# Patient Record
Sex: Male | Born: 1970 | Race: Black or African American | Hispanic: No | Marital: Single | Smoking: Former smoker
Health system: Southern US, Community
[De-identification: ages and names within clinical notes are randomized; demographics above are authoritative.]

## PROBLEM LIST (undated history)

## (undated) DIAGNOSIS — F419 Anxiety disorder, unspecified: Secondary | ICD-10-CM

## (undated) DIAGNOSIS — Z86718 Personal history of other venous thrombosis and embolism: Secondary | ICD-10-CM

## (undated) DIAGNOSIS — F32A Depression, unspecified: Secondary | ICD-10-CM

## (undated) DIAGNOSIS — D689 Coagulation defect, unspecified: Secondary | ICD-10-CM

## (undated) DIAGNOSIS — I10 Essential (primary) hypertension: Secondary | ICD-10-CM

## (undated) HISTORY — DX: Coagulation defect, unspecified: D68.9

## (undated) HISTORY — DX: Essential (primary) hypertension: I10

## (undated) HISTORY — DX: Anxiety disorder, unspecified: F41.9

## (undated) HISTORY — DX: Depression, unspecified: F32.A

---

## 1999-04-24 ENCOUNTER — Emergency Department (HOSPITAL_COMMUNITY): Admission: EM | Admit: 1999-04-24 | Discharge: 1999-04-24 | Payer: Self-pay | Admitting: Emergency Medicine

## 2001-07-05 ENCOUNTER — Emergency Department (HOSPITAL_COMMUNITY): Admission: EM | Admit: 2001-07-05 | Discharge: 2001-07-05 | Payer: Self-pay | Admitting: Emergency Medicine

## 2001-07-05 ENCOUNTER — Encounter: Payer: Self-pay | Admitting: Emergency Medicine

## 2002-08-15 ENCOUNTER — Emergency Department (HOSPITAL_COMMUNITY): Admission: EM | Admit: 2002-08-15 | Discharge: 2002-08-15 | Payer: Self-pay | Admitting: Emergency Medicine

## 2002-09-24 ENCOUNTER — Emergency Department (HOSPITAL_COMMUNITY): Admission: EM | Admit: 2002-09-24 | Discharge: 2002-09-25 | Payer: Self-pay | Admitting: Emergency Medicine

## 2003-03-08 ENCOUNTER — Inpatient Hospital Stay (HOSPITAL_COMMUNITY): Admission: AD | Admit: 2003-03-08 | Discharge: 2003-03-11 | Payer: Self-pay | Admitting: Psychiatry

## 2003-05-25 ENCOUNTER — Emergency Department (HOSPITAL_COMMUNITY): Admission: AD | Admit: 2003-05-25 | Discharge: 2003-05-25 | Payer: Self-pay | Admitting: Internal Medicine

## 2003-09-12 ENCOUNTER — Emergency Department (HOSPITAL_COMMUNITY): Admission: EM | Admit: 2003-09-12 | Discharge: 2003-09-12 | Payer: Self-pay | Admitting: Family Medicine

## 2003-09-20 ENCOUNTER — Emergency Department (HOSPITAL_COMMUNITY): Admission: EM | Admit: 2003-09-20 | Discharge: 2003-09-20 | Payer: Self-pay | Admitting: Emergency Medicine

## 2003-09-27 ENCOUNTER — Emergency Department (HOSPITAL_COMMUNITY): Admission: EM | Admit: 2003-09-27 | Discharge: 2003-09-27 | Payer: Self-pay | Admitting: Emergency Medicine

## 2004-08-05 ENCOUNTER — Emergency Department (HOSPITAL_COMMUNITY): Admission: EM | Admit: 2004-08-05 | Discharge: 2004-08-05 | Payer: Self-pay | Admitting: Family Medicine

## 2004-08-14 ENCOUNTER — Emergency Department (HOSPITAL_COMMUNITY): Admission: EM | Admit: 2004-08-14 | Discharge: 2004-08-14 | Payer: Self-pay | Admitting: Family Medicine

## 2005-03-08 ENCOUNTER — Emergency Department (HOSPITAL_COMMUNITY): Admission: EM | Admit: 2005-03-08 | Discharge: 2005-03-09 | Payer: Self-pay | Admitting: Emergency Medicine

## 2016-05-30 HISTORY — PX: BELOW KNEE LEG AMPUTATION: SUR23

## 2020-11-24 ENCOUNTER — Inpatient Hospital Stay (HOSPITAL_COMMUNITY): Payer: Medicare Other

## 2020-11-24 ENCOUNTER — Observation Stay (HOSPITAL_BASED_OUTPATIENT_CLINIC_OR_DEPARTMENT_OTHER)
Admission: EM | Admit: 2020-11-24 | Discharge: 2020-11-25 | Disposition: A | Discharge status: Home / Self Care | Payer: Medicare Other | Attending: Internal Medicine | Admitting: Internal Medicine

## 2020-11-24 ENCOUNTER — Emergency Department (HOSPITAL_BASED_OUTPATIENT_CLINIC_OR_DEPARTMENT_OTHER): Payer: Medicare Other

## 2020-11-24 ENCOUNTER — Other Ambulatory Visit: Payer: Self-pay

## 2020-11-24 ENCOUNTER — Encounter (HOSPITAL_BASED_OUTPATIENT_CLINIC_OR_DEPARTMENT_OTHER): Payer: Self-pay | Admitting: Obstetrics and Gynecology

## 2020-11-24 DIAGNOSIS — M79605 Pain in left leg: Secondary | ICD-10-CM | POA: Diagnosis present

## 2020-11-24 DIAGNOSIS — Z20822 Contact with and (suspected) exposure to covid-19: Secondary | ICD-10-CM | POA: Diagnosis not present

## 2020-11-24 DIAGNOSIS — R29898 Other symptoms and signs involving the musculoskeletal system: Secondary | ICD-10-CM | POA: Diagnosis present

## 2020-11-24 DIAGNOSIS — E785 Hyperlipidemia, unspecified: Secondary | ICD-10-CM | POA: Diagnosis present

## 2020-11-24 DIAGNOSIS — R2990 Unspecified symptoms and signs involving the nervous system: Secondary | ICD-10-CM

## 2020-11-24 DIAGNOSIS — Z79899 Other long term (current) drug therapy: Secondary | ICD-10-CM | POA: Insufficient documentation

## 2020-11-24 DIAGNOSIS — Z7901 Long term (current) use of anticoagulants: Secondary | ICD-10-CM

## 2020-11-24 DIAGNOSIS — I82409 Acute embolism and thrombosis of unspecified deep veins of unspecified lower extremity: Secondary | ICD-10-CM | POA: Diagnosis present

## 2020-11-24 DIAGNOSIS — I10 Essential (primary) hypertension: Secondary | ICD-10-CM | POA: Diagnosis not present

## 2020-11-24 DIAGNOSIS — I82411 Acute embolism and thrombosis of right femoral vein: Secondary | ICD-10-CM | POA: Diagnosis not present

## 2020-11-24 DIAGNOSIS — Z87891 Personal history of nicotine dependence: Secondary | ICD-10-CM | POA: Diagnosis not present

## 2020-11-24 DIAGNOSIS — I739 Peripheral vascular disease, unspecified: Secondary | ICD-10-CM | POA: Diagnosis present

## 2020-11-24 DIAGNOSIS — R29818 Other symptoms and signs involving the nervous system: Secondary | ICD-10-CM | POA: Diagnosis not present

## 2020-11-24 DIAGNOSIS — R202 Paresthesia of skin: Secondary | ICD-10-CM

## 2020-11-24 HISTORY — DX: Personal history of other venous thrombosis and embolism: Z86.718

## 2020-11-24 HISTORY — DX: Other symptoms and signs involving the musculoskeletal system: R29.898

## 2020-11-24 LAB — CBC WITH DIFFERENTIAL/PLATELET
Abs Immature Granulocytes: 0.05 10*3/uL (ref 0.00–0.07)
Basophils Absolute: 0.1 10*3/uL (ref 0.0–0.1)
Basophils Relative: 1 %
Eosinophils Absolute: 0.3 10*3/uL (ref 0.0–0.5)
Eosinophils Relative: 3 %
HCT: 41.1 % (ref 39.0–52.0)
Hemoglobin: 13.3 g/dL (ref 13.0–17.0)
Immature Granulocytes: 1 %
Lymphocytes Relative: 36 %
Lymphs Abs: 3.4 10*3/uL (ref 0.7–4.0)
MCH: 27 pg (ref 26.0–34.0)
MCHC: 32.4 g/dL (ref 30.0–36.0)
MCV: 83.4 fL (ref 80.0–100.0)
Monocytes Absolute: 0.8 10*3/uL (ref 0.1–1.0)
Monocytes Relative: 8 %
Neutro Abs: 4.9 10*3/uL (ref 1.7–7.7)
Neutrophils Relative %: 51 %
Platelets: 264 10*3/uL (ref 150–400)
RBC: 4.93 MIL/uL (ref 4.22–5.81)
RDW: 13.1 % (ref 11.5–15.5)
WBC: 9.5 10*3/uL (ref 4.0–10.5)
nRBC: 0 % (ref 0.0–0.2)

## 2020-11-24 LAB — LACTIC ACID, PLASMA
Lactic Acid, Venous: 1.6 mmol/L (ref 0.5–1.9)
Lactic Acid, Venous: 1.1 mmol/L (ref 0.5–1.9)

## 2020-11-24 LAB — PROTIME-INR
INR: 1 (ref 0.8–1.2)
Prothrombin Time: 13.4 seconds (ref 11.4–15.2)

## 2020-11-24 LAB — COMPREHENSIVE METABOLIC PANEL
ALT: 24 U/L (ref 0–44)
AST: 16 U/L (ref 15–41)
Albumin: 4.3 g/dL (ref 3.5–5.0)
Alkaline Phosphatase: 85 U/L (ref 38–126)
Anion gap: 8 (ref 5–15)
BUN: 11 mg/dL (ref 6–20)
CO2: 26 mmol/L (ref 22–32)
Calcium: 9.1 mg/dL (ref 8.9–10.3)
Chloride: 105 mmol/L (ref 98–111)
Creatinine, Ser: 1.12 mg/dL (ref 0.61–1.24)
GFR, Estimated: >60 mL/min (ref >60)
Glucose, Bld: 119 mg/dL — ABNORMAL HIGH (ref 70–99)
Potassium: 3.7 mmol/L (ref 3.5–5.1)
Sodium: 139 mmol/L (ref 135–145)
Total Bilirubin: 0.4 mg/dL (ref 0.3–1.2)
Total Protein: 7.2 g/dL (ref 6.5–8.1)

## 2020-11-24 LAB — TSH: TSH: 2.337 u[IU]/mL (ref 0.350–4.500)

## 2020-11-24 LAB — RESP PANEL BY RT-PCR (FLU A&B, COVID) ARPGX2
Influenza A by PCR: NEGATIVE
Influenza B by PCR: NEGATIVE
SARS Coronavirus 2 by RT PCR: NEGATIVE

## 2020-11-24 LAB — TROPONIN I (HIGH SENSITIVITY)
Troponin I (High Sensitivity): 2 ng/L (ref <18)
Troponin I (High Sensitivity): 2 ng/L (ref <18)

## 2020-11-24 LAB — PHOSPHORUS: Phosphorus: 4.7 mg/dL — ABNORMAL HIGH (ref 2.5–4.6)

## 2020-11-24 LAB — CK: Total CK: 185 U/L (ref 49–397)

## 2020-11-24 LAB — MAGNESIUM: Magnesium: 2.2 mg/dL (ref 1.7–2.4)

## 2020-11-24 MED ORDER — ACETAMINOPHEN 650 MG RE SUPP: 650.0000 mg | RECTAL | Status: DC | PRN

## 2020-11-24 MED ORDER — ACETAMINOPHEN 160 MG/5ML PO SOLN
650.0000 mg | ORAL | Status: DC | PRN
Start: 2020-11-24 — End: 2020-11-25

## 2020-11-24 MED ORDER — SODIUM CHLORIDE 0.9 % IV SOLN: INTRAVENOUS | Status: DC

## 2020-11-24 MED ORDER — STROKE: EARLY STAGES OF RECOVERY BOOK
Freq: Once | Status: AC
  Filled 2020-11-24 (×2): qty 1

## 2020-11-24 MED ORDER — HYDROMORPHONE HCL 1 MG/ML IJ SOLN
1.0000 mg | Freq: Once | INTRAMUSCULAR | Status: AC
Start: 2020-11-24 — End: 2020-11-24
  Administered 2020-11-24: 1 mg via INTRAVENOUS
  Filled 2020-11-24: qty 1

## 2020-11-24 MED ORDER — HYDROMORPHONE HCL 1 MG/ML IJ SOLN
1.0000 mg | Freq: Once | INTRAMUSCULAR | Status: AC
  Administered 2020-11-24: 1 mg via INTRAVENOUS
  Filled 2020-11-24: qty 1

## 2020-11-24 MED ORDER — ATORVASTATIN CALCIUM 40 MG PO TABS
40.0000 mg | ORAL_TABLET | Freq: Every day | ORAL | Status: DC
  Administered 2020-11-25: 40 mg via ORAL
  Filled 2020-11-24: qty 1

## 2020-11-24 MED ORDER — CLOPIDOGREL BISULFATE 75 MG PO TABS: 75.0000 mg | ORAL_TABLET | Freq: Every day | ORAL | Status: DC

## 2020-11-24 MED ORDER — CYCLOBENZAPRINE HCL 10 MG PO TABS
10.0000 mg | ORAL_TABLET | Freq: Three times a day (TID) | ORAL | Status: DC
  Administered 2020-11-24 – 2020-11-25 (×2): 10 mg via ORAL
  Filled 2020-11-24 (×2): qty 1

## 2020-11-24 MED ORDER — ACETAMINOPHEN 325 MG PO TABS: 650.0000 mg | ORAL_TABLET | ORAL | Status: DC | PRN

## 2020-11-24 MED ORDER — QUETIAPINE FUMARATE 50 MG PO TABS
150.0000 mg | ORAL_TABLET | Freq: Every day | ORAL | Status: DC
  Administered 2020-11-24: 150 mg via ORAL
  Filled 2020-11-24: qty 1

## 2020-11-24 MED ORDER — HYDROCODONE-ACETAMINOPHEN 5-325 MG PO TABS
1.0000 | ORAL_TABLET | Freq: Four times a day (QID) | ORAL | Status: DC | PRN
Start: 2020-11-24 — End: 2020-11-25
  Administered 2020-11-24 – 2020-11-25 (×3): 1 via ORAL
  Filled 2020-11-24 (×3): qty 1

## 2020-11-24 MED ORDER — DULOXETINE HCL 60 MG PO CPEP
120.0000 mg | ORAL_CAPSULE | Freq: Every day | ORAL | Status: DC
  Administered 2020-11-25: 120 mg via ORAL
  Filled 2020-11-24: qty 2

## 2020-11-24 NOTE — Progress Notes (Signed)
Received a phone call from Facility: [facility]   Requesting MD: dr. 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 Feels like left leg gives way. Also has  upper arm burning pain and decreased strength with dropping things. Some neuro findings with weakness in his left leg and twitching toes. CTH no acute findings.   -no DVT in Left, occlusive DVT in contralateral right common femoral vein. HE IS ON ELIQUIS!   All records from [place] and can not access.  Concern for neurological issue and would like MRI/work up.  Also has Need to get records. Has no complaints of right leg pain, but incidental finding of clot on eliquis.   Plan of care:  The patient will be accepted for admission to telemetry at [facility] when bed is available.    Nursing staff, Please call the [place] number at the top of Amion at the time of the patient's arrival so that the patient can be paged to the admitting physician.   Casimer Bilis, M.D. [facility]

## 2020-11-24 NOTE — ED Notes (Signed)
Meal given

## 2020-11-24 NOTE — ED Notes (Signed)
Report called to Reyonna Crogher,RN [facility].

## 2020-11-24 NOTE — ED Provider Notes (Addendum)
[place] EMERGENCY DEPT Provider Note   CSN: [id number] Arrival date & time: 11/24/20  1300     History Chief Complaint  Patient presents with   Leg Pain    Jose Hansen is a 50 y.o. male.  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  He is followed at the [facility] and recently moved to the area to be with his brother.  He states that he is unable to care for himself.  He presents with left leg pain, and this is a chronic issue that has been going on since he last had a vascular study about a month ago.  However, a few days ago he began to fall.  He states that his left leg has just given way, and he has fallen.  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  He states that this was a result of wrong surgery being performed.  He does have a prosthesis but states that he has pain in his stump and cannot use the prosthesis for more than 1 hour.  He has also begun to notice burning in the dorsum of his arm on the right side.  He has had some right hand weakness and has started to drop things.  The history is provided by the patient.  Extremity Weakness This is a new problem. The current episode started more than 2 days ago (maybe 3 days ago). The problem occurs constantly. The problem has not changed since onset.Pertinent negatives include no chest pain, no abdominal pain, no headaches and no shortness of breath. The symptoms are aggravated by standing. Nothing relieves the symptoms. Treatments tried: gabapentin. The treatment provided no relief.      Past Medical History:  Diagnosis Date   H/O blood clots     There are no problems to display for this patient.   Past Surgical History:  Procedure Laterality Date   BELOW KNEE LEG AMPUTATION Right 2018       History reviewed. No pertinent family history.  Social History   Tobacco Use   Smoking status: Former    Pack  years: 0.00    Types: Cigarettes  Vaping Use   Vaping Use: Never used  Substance Use Topics   Alcohol use: Not Currently   Drug use: Not Currently    Home Medications Prior to Admission medications   Medication Sig Start Date End Date Taking? Authorizing Provider  atorvastatin (LIPITOR) 40 MG tablet Take 40 mg by mouth daily. 08/07/20   [provider]  clopidogrel (PLAVIX) 75 MG tablet Take 75 mg by mouth daily. 10/24/20   [provider]  cyclobenzaprine (FLEXERIL) 10 MG tablet Take 10 mg by mouth 3 (three) times daily. 11/11/20   [provider]  DULoxetine (CYMBALTA) 60 MG capsule Take 120 mg by mouth daily. 11/16/20   [provider]  ELIQUIS 5 MG TABS tablet Take 5 mg by mouth 2 (two) times daily. 11/16/20   [provider]  HYDROcodone-acetaminophen (NORCO/VICODIN) 5-325 MG tablet Take 1 tablet by mouth every 6 (six) hours as needed. 08/06/20   [provider]  oxyCODONE (OXY IR/ROXICODONE) 5 MG immediate release tablet Take by mouth. 10/24/20   [provider]  pravastatin (PRAVACHOL) 40 MG tablet Take 40 mg by mouth daily. 11/02/20   [provider]  QUEtiapine (SEROQUEL) 50 MG tablet Take by mouth. 11/02/20   [provider]  Allergies    Patient has no allergy information on record.  Review of Systems   Review of Systems  Constitutional:  Negative for chills and fever.  HENT:  Negative for ear pain and sore throat.   Eyes:  Negative for pain and visual disturbance.  Respiratory:  Negative for cough and shortness of breath.   Cardiovascular:  Negative for chest pain and palpitations.  Gastrointestinal:  Negative for abdominal pain and vomiting.  Genitourinary:  Negative for dysuria and hematuria.  Musculoskeletal:  Positive for extremity weakness. Negative for arthralgias and back pain.  Skin:  Negative for color change and rash.  Neurological:  Positive for weakness. Negative for seizures, syncope  and headaches.  All other systems reviewed and are negative.  Physical Exam Updated Vital Signs BP (!) 150/94 (BP Location: Right Arm)   Pulse 69   Temp 98.6 F (37 C) (Oral)   Resp 18   Wt 86 kg   SpO2 100%   Physical Exam Vitals and nursing note reviewed.  Constitutional:      Appearance: Normal appearance.  HENT:     Head: Normocephalic and atraumatic.  Eyes:     Extraocular Movements: Extraocular movements intact.  Cardiovascular:     Rate and Rhythm: Normal rate and regular rhythm.     Heart sounds: Normal heart sounds.  Pulmonary:     Effort: Pulmonary effort is normal.     Breath sounds: Normal breath sounds.  Abdominal:     General: There is no distension.     Tenderness: There is no abdominal tenderness. There is no guarding.  Musculoskeletal:     Cervical back: Normal range of motion.     Comments: Above-the-knee amputation on the right side.  On the left leg, there is a medial scar on the lower leg.  There are palpable dorsalis pedis and posterior tibialis pulses.  His toes are very slightly discolored but still warm.  There is slightly delayed cap refill on the toes.  Otherwise, there are no musculoskeletal abnormalities.  No deformity of the spine. No midline spinal tenderness  Right thigh is not swollen or particularly tender. It is warm and well perfused.  Skin:    General: Skin is warm and dry.  Neurological:     Mental Status: He is alert and oriented to person, place, and time.     Comments: Patient has somewhat diffuse weakness of the left lower extremity.  He is able to hold it off the bed for a few seconds, but he has tremors and fasciculations.  He seems to have weakness especially of great toe extension.  He also has some bilateral, right greater than left grip strength weakness.  He endorses numbness over the dorsum of the forearm on the right but is able to identify light touch  Psychiatric:        Mood and Affect: Mood normal.        Behavior:  Behavior normal.    ED Results / Procedures / Treatments   Labs (all labs ordered are listed, but only abnormal results are displayed) Labs Reviewed  COMPREHENSIVE METABOLIC PANEL - Abnormal; Notable for the following components:      Result Value   Glucose, Bld 119 (*)    All other components within normal limits  HEMOGLOBIN A1C - Abnormal; Notable for the following components:   Hgb A1c MFr Bld 6.0 (*)    All other components within normal limits  LIPID PANEL - Abnormal; Notable for the following  components:   Cholesterol 254 (*)    Triglycerides 264 (*)    HDL 29 (*)    VLDL 53 (*)    LDL Cholesterol 172 (*)    All other components within normal limits  URINALYSIS, ROUTINE W REFLEX MICROSCOPIC - Abnormal; Notable for the following components:   Color, Urine STRAW (*)    All other components within normal limits  RAPID URINE DRUG SCREEN, HOSP PERFORMED - Abnormal; Notable for the following components:   Opiates POSITIVE (*)    All other components within normal limits  PHOSPHORUS - Abnormal; Notable for the following components:   Phosphorus 4.7 (*)    All other components within normal limits  RESP PANEL BY RT-PCR (FLU A&B, COVID) ARPGX2  LACTIC ACID, PLASMA  LACTIC ACID, PLASMA  CK  PROTIME-INR  HIV ANTIBODY (ROUTINE TESTING W REFLEX)  CBC WITH DIFFERENTIAL/PLATELET  HIV-1 RNA QUANT-NO REFLEX-BLD  TSH  ANA W/REFLEX IF POSITIVE  MAGNESIUM  APTT  HEPARIN LEVEL (UNFRACTIONATED)  TROPONIN I (HIGH SENSITIVITY)  TROPONIN I (HIGH SENSITIVITY)    EKG EKG Interpretation  Date/Time:  Tuesday November 24 2020 13:51:50 EDT Ventricular Rate:  94 PR Interval:  137 QRS Duration: 89 QT Interval:  332 QTC Calculation: 416 R Axis:   67 Text Interpretation: Sinus rhythm Normal axis No acute ischemia Confirmed by Lorre Munroe ([id number]) on 11/24/2020 1:55:43 PM  Radiology CT Head Wo Contrast  Result Date: 11/24/2020 CLINICAL DATA:  Acute neuro deficit. EXAM: CT HEAD WITHOUT  CONTRAST TECHNIQUE: Contiguous axial images were obtained from the base of the skull through the vertex without intravenous contrast. COMPARISON:  None. FINDINGS: Brain: No evidence of acute infarction, hemorrhage, hydrocephalus, extra-axial collection or mass lesion/mass effect. Vascular: No hyperdense vessel or unexpected calcification. Skull: Normal. Negative for fracture or focal lesion. Sinuses/Orbits: No acute finding. Other: None. IMPRESSION: No acute intracranial abnormality seen. Electronically Signed   By: Marijo Conception M.D.   On: 11/24/2020 14:58   US Venous Img Lower Unilateral Left  Result Date: 11/24/2020 CLINICAL DATA:  Left lower extremity edema EXAM: LEFT LOWER EXTREMITY VENOUS DOPPLER ULTRASOUND TECHNIQUE: Gray-scale sonography with compression, as well as color and duplex ultrasound, were performed to evaluate the deep venous system(s) from the level of the common femoral vein through the popliteal and proximal calf veins. COMPARISON:  None. FINDINGS: VENOUS Normal compressibility of the common femoral, superficial femoral, and popliteal veins, as well as the visualized calf veins. Visualized portions of profunda femoral vein and great saphenous vein unremarkable. No filling defects to suggest DVT on grayscale or color Doppler imaging. Doppler waveforms show normal direction of venous flow, normal respiratory plasticity and response to augmentation. Limited views of the contralateral common femoral vein demonstrates occlusive thrombus in the right common femoral vein. OTHER None. Limitations: none IMPRESSION: 1. No evidence of acute DVT in the left lower extremity. 2. Positive for occlusive DVT in the contralateral right common femoral vein. Electronically Signed   By: Jacqulynn Cadet M.D.   On: 11/24/2020 15:28    Procedures Procedures   Medications Ordered in ED Medications - No data to display  ED Course  I have reviewed the triage vital signs and the nursing  notes.  Pertinent labs & imaging results that were available during my care of the patient were reviewed by me and considered in my medical decision making (see chart for details).  Clinical Course as of 11/28/20 1725  Tue Nov 24, 2020  1355 Will attempt to obtain records  from [facility]. [AW]  1644 I spoke with Dr. Rogers Blocker who will admit the patient. [AW]    Clinical Course User Index [AW] Arnaldo Natal, MD   MDM Rules/Calculators/A&P                          [place] Jacquiline Doe presented with left leg pain, weakness, right arm paresthesias.  He has some kind of hypercoagulopathy and is on Eliquis.  It also appears that he has had some sort of vasculopathy in addition to his coagulopathy.  On exam, it appeared that he was experiencing objective weakness in his left leg and perhaps his right arm.  He was outside any sort of code stroke window.  CT imaging failed to reveal acute intracranial pathology.  He was evaluated for evidence of rhabdomyolysis, metabolic abnormality.  My impression is that he may have some kind of progressive neurologic disease.  Less likely would be some sort of vascular contributing factor to his weakness.  Finally, he was found to have an acute DVT in his right common femoral vein.  This is despite being on Eliquis.  For all these reasons, he will be admitted for further evaluation. Final Clinical Impression(s) / ED Diagnoses Final diagnoses:  Acute deep vein thrombosis (DVT) of femoral vein of right lower extremity ([place])  Long term (current) use of anticoagulants  Weakness of left leg  Paresthesia of right arm    Rx / DC Orders ED Discharge Orders     None        Arnaldo Natal, MD 11/24/20 1647    Arnaldo Natal, MD 11/28/20 [phone]

## 2020-11-24 NOTE — H&P (Addendum)
Jose Hansen [id number] DOB: 1970/09/09 DOA: 11/24/2020    PCP: Merryl Hacker, No   Outpatient Specialists:  NONE    Patient arrived to ER on 11/24/20 at 1300 Referred by Attending Orma Flaming, MD   Patient coming from: home Lives With family    Chief Complaint:   Chief Complaint  Patient presents with   Leg Pain    HPI: Jose Hansen is a 50 y.o. male with medical history significant of PVD sp R AKA 2018?    Presented with  pain and cramping in Left leg and right stump, weakness in Left leg that is new, burning in right arm.  presenting with weakness and falling. Feels like left leg gives way. Also has  upper arm burning pain and decreased strength with dropping things. Some neuro findings with weakness in his left leg and twitching toes. CT Head no acute findings.     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 This was done at [facility] Denies any hx of CVA's  Has  NOt been vaccinated against COVID    Initial COVID TEST  NEGATIVE   Lab Results  Component Value Date   [place] NEGATIVE 11/24/2020     Regarding pertinent Chronic problems:     Hx of DVT/ on - anticoagulation with  Eliquis reports being compliant   While in ER: Doppler showed no DVT on Left but occlusive DVT in contralateral right common femoral vein. Despite being ON ELIQUIS! CT head non-acute    ED Triage Vitals  Enc Vitals Group     BP 11/24/20 1311 (!) 150/94     Pulse Rate 11/24/20 1311 69     Resp 11/24/20 1311 18     Temp 11/24/20 1311 98.6 F (37 C)     Temp Source 11/24/20 1311 Oral     SpO2 11/24/20 1311 100 %     Weight 11/24/20 1312 189 lb 11.3 oz (86 kg)     Height --      Head Circumference --      Peak Flow --       Pain Score 11/24/20 1312 10     Pain Loc --      Pain Edu? --      Excl. in [place]? --   TMAX(24)@     _________________________________________ Significant initial  Findings: Abnormal Labs Reviewed  COMPREHENSIVE METABOLIC PANEL - Abnormal; Notable for the following components:      Result Value   Glucose, Bld 119 (*)    All other components within normal limits   ____________________________________________ Ordered CT HEAD  NON acute  CXR -  NON acute cardiomegaly? Positive for occlusive DVT in the contralateral right common femoral vein.      ECG: Ordered Personally reviewed by me showing: HR : 94 Rhythm:  NSR,   no evidence of ischemic changes QTC 416  The recent clinical data is shown below. Vitals:   11/24/20 1600 11/24/20 1700 11/24/20 1800 11/24/20 2006  BP: (!) 164/99 (!) 173/137 (!) 161/100   Pulse: 88 76 76 85  Resp: (!) '26 13 14 18  '$ Temp:    98 F (36.7 C)  TempSrc:  Oral  SpO2: 97% 98% 97% 100%  Weight:         WBC  No results found for: WBC, LYMPHSABS, MONOABS, EOSABS, BASOSABS      Lactic Acid, Venous    Component Value Date/Time   LATICACIDVEN 1.1 11/24/2020 1640       UA  ordered      Results for orders placed or performed during the hospital encounter of 11/24/20  Resp Panel by RT-PCR (Flu A&B, Covid) Nasopharyngeal Swab     Status: None   Collection Time: 11/24/20  2:21 PM   Specimen: Nasopharyngeal Swab; Nasopharyngeal(NP) swabs in vial transport medium  Result Value Ref Range Status   SARS Coronavirus 2 by RT PCR NEGATIVE NEGATIVE Final         Influenza A by PCR NEGATIVE NEGATIVE Final   Influenza B by PCR NEGATIVE NEGATIVE Final            _______________________________________________ Hospitalist was called for admission for DVT while on eliquis with focal neurological deficits  The following Work up has been ordered so far:  Orders Placed This Encounter  Procedures   Resp Panel by RT-PCR (Flu A&B, Covid)  Nasopharyngeal Swab   CT Head Wo Contrast   US Venous Img Lower Unilateral Left   Comprehensive metabolic panel   Lactic acid, plasma   CK   Protime-INR   Cardiac monitoring   Consult to hospitalist   Airborne and Contact precautions   ED EKG   EKG 12-Lead   EKG 12-Lead   Saline lock IV   Admit to Inpatient (patient's expected length of stay will be greater than 2 midnights or inpatient only procedure)     Following Medications were ordered in ER: Medications  HYDROmorphone (DILAUDID) injection 1 mg (1 mg Intravenous Given 11/24/20 1422)  HYDROmorphone (DILAUDID) injection 1 mg (1 mg Intravenous Given 11/24/20 1637)        Consult Orders  (From admission, onward)           Start     Ordered   11/24/20 1530  Consult to hospitalist  Spoke to Christine at [facility]  Once       Provider:  (Not yet assigned)  Question Answer Comment  Place call to: [facility]   Reason for Consult Admit      11/24/20 1529              OTHER Significant initial  Findings:  labs showing:    Recent Labs  Lab 11/24/20 1421  NA 139  K 3.7  CO2 26  GLUCOSE 119*  BUN 11  CREATININE 1.12  CALCIUM 9.1    Cr   stable,   Lab Results  Component Value Date   CREATININE 1.12 11/24/2020    Recent Labs  Lab 11/24/20 1421  AST 16  ALT 24  ALKPHOS 85  BILITOT 0.4  PROT 7.2  ALBUMIN 4.3   Lab Results  Component Value Date   CALCIUM 9.1 11/24/2020    Plt: No results found for: PLT   COVID-19 Labs  No results for input(s): DDIMER, FERRITIN, LDH, CRP in the last 72 hours.  Lab Results  Component Value Date   [place] NEGATIVE 11/24/2020     No results for input(s): LIPASE, AMYLASE in the last 168 hours. No results for input(s): AMMONIA in the last 168 hours.   Cardiac Panel (last 3 results) Recent Labs    11/24/20 1421  CKTOTAL 185     BNP (last  3 results) No results for input(s): BNP in the last 8760 hours.    DM  labs:  HbA1C: No results  for input(s): HGBA1C in the last 8760 hours.      CBG (last 3)  No results for input(s): GLUCAP in the last 72 hours.     Cultures: No results found for: SDES, SPECREQUEST, CULT, REPTSTATUS   Radiological Exams on Admission: CT Head Wo Contrast  Result Date: 11/24/2020 CLINICAL DATA:  Acute neuro deficit. EXAM: CT HEAD WITHOUT CONTRAST TECHNIQUE: Contiguous axial images were obtained from the base of the skull through the vertex without intravenous contrast. COMPARISON:  None. FINDINGS: Brain: No evidence of acute infarction, hemorrhage, hydrocephalus, extra-axial collection or mass lesion/mass effect. Vascular: No hyperdense vessel or unexpected calcification. Skull: Normal. Negative for fracture or focal lesion. Sinuses/Orbits: No acute finding. Other: None. IMPRESSION: No acute intracranial abnormality seen. Electronically Signed   By: Marijo Conception M.D.   On: 11/24/2020 14:58   US Venous Img Lower Unilateral Left  Result Date: 11/24/2020 CLINICAL DATA:  Left lower extremity edema EXAM: LEFT LOWER EXTREMITY VENOUS DOPPLER ULTRASOUND TECHNIQUE: Gray-scale sonography with compression, as well as color and duplex ultrasound, were performed to evaluate the deep venous system(s) from the level of the common femoral vein through the popliteal and proximal calf veins. COMPARISON:  None. FINDINGS: VENOUS Normal compressibility of the common femoral, superficial femoral, and popliteal veins, as well as the visualized calf veins. Visualized portions of profunda femoral vein and great saphenous vein unremarkable. No filling defects to suggest DVT on grayscale or color Doppler imaging. Doppler waveforms show normal direction of venous flow, normal respiratory plasticity and response to augmentation. Limited views of the contralateral common femoral vein demonstrates occlusive thrombus in the right common femoral vein. OTHER None. Limitations: none IMPRESSION: 1. No evidence of acute DVT in the left lower  extremity. 2. Positive for occlusive DVT in the contralateral right common femoral vein. Electronically Signed   By: Jacqulynn Cadet M.D.   On: 11/24/2020 15:28   _______________________________________________________________________________________________________ Latest  Blood pressure (!) 161/100, pulse 85, temperature 98 F (36.7 C), temperature source Oral, resp. rate 18, weight 86 kg, SpO2 100 %.   Review of Systems:    Pertinent positives include: leg weakness on the left, burning sensation in right arm Cramping pain  Constitutional:  No weight loss, night sweats, Fevers, chills, fatigue, weight loss  HEENT:  No headaches, Difficulty swallowing,Tooth/dental problems,Sore throat,  No sneezing, itching, ear ache, nasal congestion, post nasal drip,  Cardio-vascular:  No chest pain, Orthopnea, PND, anasarca, dizziness, palpitations.no Bilateral lower extremity swelling  GI:  No heartburn, indigestion, abdominal pain, nausea, vomiting, diarrhea, change in bowel habits, loss of appetite, melena, blood in stool, hematemesis Resp:  no shortness of breath at rest. No dyspnea on exertion, No excess mucus, no productive cough, No non-productive cough, No coughing up of blood.No change in color of mucus.No wheezing. Skin:  no rash or lesions. No jaundice GU:  no dysuria, change in color of urine, no urgency or frequency. No straining to urinate.  No flank pain.  Musculoskeletal:  No joint pain or no joint swelling. No decreased range of motion. No back pain.  Psych:  No change in mood or affect. No depression or anxiety. No memory loss.  Neuro: no localizing neurological complaints, no tingling, no weakness, no double vision, no gait abnormality, no slurred speech, no confusion  All systems reviewed and apart from [place] all are negative _______________________________________________________________________________________________ Past Medical  History:   Past Medical History:   Diagnosis Date   H/O blood clots       Past Surgical History:  Procedure Laterality Date   BELOW KNEE LEG AMPUTATION Right 2018    Social History:  Ambulatory   wheelchair bound      reports that he has quit smoking. His smoking use included cigarettes. He has never used smokeless tobacco. He reports previous alcohol use. He reports previous drug use.     Family History:   Family History  Problem Relation Age of Onset   CAD Neg Hx    Hypertension Neg Hx    ______________________________________________________________________________________________ Allergies: Not on File   Prior to Admission medications   Medication Sig Start Date End Date Taking? Authorizing Provider  atorvastatin (LIPITOR) 40 MG tablet Take 40 mg by mouth daily. 08/07/20  Yes [provider]  clopidogrel (PLAVIX) 75 MG tablet Take 75 mg by mouth daily. 10/24/20  Yes [provider]  cyclobenzaprine (FLEXERIL) 10 MG tablet Take 10 mg by mouth 3 (three) times daily. 11/11/20  Yes [provider]  DULoxetine (CYMBALTA) 60 MG capsule Take 120 mg by mouth daily. 11/16/20  Yes [provider]  ELIQUIS 5 MG TABS tablet Take 5 mg by mouth 2 (two) times daily. 11/16/20  Yes [provider]  HYDROcodone-acetaminophen (NORCO/VICODIN) 5-325 MG tablet Take 1 tablet by mouth every 6 (six) hours as needed. 08/06/20  Yes [provider]  oxyCODONE (OXY IR/ROXICODONE) 5 MG immediate release tablet Take by mouth. 10/24/20  Yes [provider]  pravastatin (PRAVACHOL) 40 MG tablet Take 40 mg by mouth daily. 11/02/20  Yes [provider]  QUEtiapine (SEROQUEL) 50 MG tablet Take 150 mg by mouth at bedtime. 11/02/20  Yes [provider]    ___________________________________________________________________________________________________ Physical Exam: Vitals with BMI 11/24/2020 11/24/2020 11/24/2020  Weight - - -  Systolic - [id number]  Diastolic - 123XX123  0000000  Pulse 85 76 76     1. General:  in No  Acute distress   Chronically ill  -appearing 2. Psychological: Alert and  Oriented 3. Head/ENT:   Dry Mucous Membranes                          Head Non traumatic, neck supple                           Poor Dentition 4. SKIN:  decreased Skin turgor,  Skin clean Dry and intact no rash 5. Heart: Regular rate and rhythm no Murmur, no Rub or gallop 6. Lungs: no wheezes or crackles   7. Abdomen: Soft,  non-tender, Non distended bowel sounds present 8. Lower extremities: no clubbing, cyanosis, no edema, Right AKA 9. Neurologically noted weakness of the left lower leg, upper ext bilateral diminished strength seems due to effort.  10. MSK: Normal range of motion     Chart has been reviewed  ______________________________________________________________________________________________  Assessment/Plan 50 y.o. male with medical history significant of PVD sp R AKA Admitted for DVT and focal deficits  Present on Admission:  Left leg weakness -obtain MRI brain, symptoms some what atypical, if MRI showing CVA will obtain neurology consult, pt already on Plavix will continue   DVT (deep venous thrombosis) (HCC) - if MRI brain neg for acute CVA will start on heparin drip for 24h and switch to Xarelto. Discussed with hematology Dr. Alen Blew with need outpatient follow  up afn outpt hypercoagulable work up Will need records obtained from [employer] Not endorsing CP or SOB   Essential hypertension - allow permissive hypertension for now   PVD (peripheral vascular disease) ([place]) - continue Plavix   Hyperlipidemia - continue lipitor  Cardiomegaly - obtain echo   Other plan as per orders.  DVT prophylaxis:  switch to heparin for 24h and then change to Xarelto     Code Status:    Code Status: Not on file FULL CODE  as per patient   I had personally discussed CODE STATUS with patient     Family Communication:   Family not at  Bedside   Disposition  Plan:     To home once workup is complete and patient is stable   Following barriers for discharge:                                                       Will need to be able to tolerate PO  Stroke work up completed                         Consults called: oncology is outpatient  Admission status:  ED Disposition     ED Disposition  [place]: [place] [100100]  Level of Care: Telemetry Medical [104]  May admit patient to [facility] or [facility] if equivalent level of care is available:: Yes  Interfacility transfer: Yes  Covid Evaluation: Confirmed COVID Negative  Diagnosis: Left leg weakness [id number]  Admitting Physician: Orma Flaming [id number]  Attending Physician: Orma Flaming [id number]  Estimated length of stay: past midnight tomorrow  Certification:: I certify this patient will need inpatient services for at least 2 midnights            inpatient     I Expect 2 midnight stay secondary to severity of patient's current illness need for inpatient interventions justified by the following:    Severe lab/radiological/exam abnormalities including:    DVT and extensive comorbidities including:   Chronic anticoagulation  That are currently affecting medical management.   I expect  patient to be hospitalized for 2 midnights requiring inpatient medical care.  Patient is at high risk for adverse outcome (such as loss of life or disability) if not treated.  Indication for inpatient stay as follows:   Need for   IV anticoagulation    Level of care   tele   indefinitely please discontinue once patient no longer qualifies COVID-19 Labs    Lab Results  Component Value Date   [place] NEGATIVE 11/24/2020     Precautions: admitted as  Covid Negative     PPE: Used by the provider:   N95  eye Goggles,  Gloves     Ervine Witucki 11/24/2020, 9:35 PM   [facility]     after 2 AM please  page floor coverage PA If 7AM-7PM, please contact the day team taking care of the patient using [email or web address]   Patient was evaluated in the context of the global COVID-19 pandemic, which necessitated consideration that the patient might be at risk for infection with the SARS-CoV-2 virus that causes COVID-19. Institutional protocols and algorithms that pertain to the evaluation of patients at risk for  COVID-19 are in a state of rapid change based on information released by regulatory bodies including the CDC and federal and state organizations. These policies and algorithms were followed during the patient's care.

## 2020-11-24 NOTE — ED Triage Notes (Signed)
Patient reports he believes he has new blood clots in his left lkeg. Patient states he is having sharp pain in the left calf and right arm and is concerned about new blood clots. Patient reports he feels significant pain in his arm and leg.

## 2020-11-25 ENCOUNTER — Other Ambulatory Visit (HOSPITAL_COMMUNITY): Payer: Medicare Other

## 2020-11-25 ENCOUNTER — Inpatient Hospital Stay (HOSPITAL_COMMUNITY): Payer: Medicare Other

## 2020-11-25 DIAGNOSIS — I82592 Chronic embolism and thrombosis of other specified deep vein of left lower extremity: Secondary | ICD-10-CM

## 2020-11-25 DIAGNOSIS — I82411 Acute embolism and thrombosis of right femoral vein: Secondary | ICD-10-CM | POA: Diagnosis not present

## 2020-11-25 DIAGNOSIS — Z7901 Long term (current) use of anticoagulants: Secondary | ICD-10-CM | POA: Diagnosis not present

## 2020-11-25 DIAGNOSIS — Z20822 Contact with and (suspected) exposure to covid-19: Secondary | ICD-10-CM | POA: Diagnosis not present

## 2020-11-25 DIAGNOSIS — Z87891 Personal history of nicotine dependence: Secondary | ICD-10-CM | POA: Diagnosis not present

## 2020-11-25 LAB — URINALYSIS, ROUTINE W REFLEX MICROSCOPIC
Bilirubin Urine: NEGATIVE
Glucose, UA: NEGATIVE mg/dL
Hgb urine dipstick: NEGATIVE
Ketones, ur: NEGATIVE mg/dL
Leukocytes,Ua: NEGATIVE
Nitrite: NEGATIVE
Protein, ur: NEGATIVE mg/dL
Specific Gravity, Urine: 1.005 (ref 1.005–1.030)
pH: 7 (ref 5.0–8.0)

## 2020-11-25 LAB — HEPARIN LEVEL (UNFRACTIONATED): Heparin Unfractionated: 0.37 IU/mL (ref 0.30–0.70)

## 2020-11-25 LAB — RAPID URINE DRUG SCREEN, HOSP PERFORMED
Amphetamines: NOT DETECTED
Barbiturates: NOT DETECTED
Benzodiazepines: NOT DETECTED
Cocaine: NOT DETECTED
Opiates: POSITIVE — AB
Tetrahydrocannabinol: NOT DETECTED

## 2020-11-25 LAB — LIPID PANEL
Cholesterol: 254 mg/dL — ABNORMAL HIGH (ref 0–200)
HDL: 29 mg/dL — ABNORMAL LOW (ref >40)
LDL Cholesterol: 172 mg/dL — ABNORMAL HIGH (ref 0–99)
Total CHOL/HDL Ratio: 8.8 RATIO
Triglycerides: 264 mg/dL — ABNORMAL HIGH (ref <150)
VLDL: 53 mg/dL — ABNORMAL HIGH (ref 0–40)

## 2020-11-25 LAB — APTT: aPTT: 35 seconds (ref 24–36)

## 2020-11-25 LAB — HIV ANTIBODY (ROUTINE TESTING W REFLEX): HIV Screen 4th Generation wRfx: NONREACTIVE

## 2020-11-25 MED ORDER — HEPARIN (PORCINE) 25000 UT/250ML-% IV SOLN
1350.0000 [IU]/h | INTRAVENOUS | Status: DC
  Administered 2020-11-25: 1350 [IU]/h via INTRAVENOUS
  Filled 2020-11-25: qty 250

## 2020-11-25 MED ORDER — DULOXETINE HCL 60 MG PO CPEP: 120.0000 mg | ORAL_CAPSULE | Freq: Every day | ORAL | 1 refills | Status: DC

## 2020-11-25 MED ORDER — ATORVASTATIN CALCIUM 40 MG PO TABS
40.0000 mg | ORAL_TABLET | Freq: Every day | ORAL | 0 refills | Status: DC
Start: 2020-11-25 — End: 2020-12-03

## 2020-11-25 MED ORDER — APIXABAN 5 MG PO TABS
10.0000 mg | ORAL_TABLET | Freq: Two times a day (BID) | ORAL | Status: DC
  Administered 2020-11-25: 10 mg via ORAL
  Filled 2020-11-25: qty 2

## 2020-11-25 MED ORDER — QUETIAPINE FUMARATE 50 MG PO TABS: 150.0000 mg | ORAL_TABLET | Freq: Every day | ORAL | 1 refills | Status: DC

## 2020-11-25 MED ORDER — CYCLOBENZAPRINE HCL 10 MG PO TABS: 10.0000 mg | ORAL_TABLET | Freq: Three times a day (TID) | ORAL | 0 refills | Status: AC

## 2020-11-25 MED ORDER — CLOPIDOGREL BISULFATE 75 MG PO TABS: 75.0000 mg | ORAL_TABLET | Freq: Every day | ORAL | 1 refills | Status: DC

## 2020-11-25 MED ORDER — APIXABAN 5 MG PO TABS: 5.0000 mg | ORAL_TABLET | Freq: Two times a day (BID) | ORAL | Status: DC

## 2020-11-25 MED ORDER — ASPIRIN EC 81 MG PO TBEC
81.0000 mg | DELAYED_RELEASE_TABLET | Freq: Every day | ORAL | Status: DC
  Administered 2020-11-25: 81 mg via ORAL
  Filled 2020-11-25: qty 1

## 2020-11-25 MED ORDER — APIXABAN 5 MG PO TABS: 5.0000 mg | ORAL_TABLET | Freq: Two times a day (BID) | ORAL | 1 refills | Status: DC

## 2020-11-25 NOTE — Progress Notes (Addendum)
ANTICOAGULATION CONSULT NOTE - Initial Consult  Pharmacy Consult for Heparin Indication: DVT  Not on File  Patient Measurements: Weight: 86 kg (189 lb 11.3 oz)  Vital Signs: Temp: 98 F (36.7 C) (06/28 2006) Temp Source: Oral (06/28 2006) BP: 123/81 (06/28 2006) Pulse Rate: 85 (06/28 2006)  Labs: Recent Labs    11/24/20 1421 11/24/20 1640 11/24/20 2110  HGB  --   --  13.3  HCT  --   --  41.1  PLT  --   --  264  LABPROT 13.4  --   --   INR 1.0  --   --   CREATININE 1.12  --   --   CKTOTAL 185  --   --   TROPONINIHS 2 2  --     CrCl cannot be calculated (Unknown ideal weight.).   Medical History: Past Medical History:  Diagnosis Date   H/O blood clots     Medications:  Medications Prior to Admission  Medication Sig Dispense Refill Last Dose   atorvastatin (LIPITOR) 40 MG tablet Take 40 mg by mouth daily.   11/24/2020   clopidogrel (PLAVIX) 75 MG tablet Take 75 mg by mouth daily.   11/23/2020   cyclobenzaprine (FLEXERIL) 10 MG tablet Take 10 mg by mouth 3 (three) times daily.   11/24/2020   DULoxetine (CYMBALTA) 60 MG capsule Take 120 mg by mouth daily.   11/24/2020   ELIQUIS 5 MG TABS tablet Take 5 mg by mouth 2 (two) times daily.   11/24/2020   HYDROcodone-acetaminophen (NORCO/VICODIN) 5-325 MG tablet Take 1 tablet by mouth every 6 (six) hours as needed.   Past Week   oxyCODONE (OXY IR/ROXICODONE) 5 MG immediate release tablet Take by mouth.   Past Week   pravastatin (PRAVACHOL) 40 MG tablet Take 40 mg by mouth daily.   Past Week   QUEtiapine (SEROQUEL) 50 MG tablet Take 150 mg by mouth at bedtime.   11/23/2020    Assessment: 50 y.o. M presents with L leg weakness. MRI of brain is normal. Pt with h/o DVT (on Eliquis) and has incidental finding of new occlusive DVT in R common femoral vein. Plan to start heparin x 24 hours and then transition to Xarelto (per hematology). Considering this an Eliquis failure - last Eliquis dose taken 6/28 a.m. Likely to be affecting  heparin level so will utilize PTT for heparin monitoring.  Baseline heparin level 0.37 (affected by Eliquis), baseline PTT 35 sec.  Goal of Therapy:  Heparin level 0.3-0.7 units/ml; aPTT 66-102 sec Monitor platelets by anticoagulation protocol: Yes   Plan:  Heparin gtt at 1350 units/hr. No bolus. Will f/u PTT in 6 hours Daily heparin level, PTT, and CBC  Sherlon Handing, PharmD, BCPS Please see amion for complete clinical pharmacist phone list 11/25/2020,2:29 AM

## 2020-11-25 NOTE — Discharge Instructions (Signed)

## 2020-11-25 NOTE — TOC Transition Note (Signed)
Transition of Care [facility]) - CM/SW Discharge Note   Patient Details  Name: Jose Hansen MRN: [id number] Date of Birth: 04-Nov-1970  [facility] CM/SW Contact:  Carles Collet, RN Phone Number: 11/25/2020, 1:34 PM   Clinical Narrative:    Patient provided with 30 day Eliquis card [facility] so he can get refills for Eliquis. He has [facility] Medicare, and is transferring his medicaid from [facility]. He started this proactively prior to relocating. R AKA, WC bound. WC in room.  Lives w brother, who provides transportation for him, also uses taxis. Verified address and cell number on file.     Final next level of care: Home/Self Care Barriers to Discharge: No Barriers Identified   Patient Goals and CMS Choice Patient states their goals for this hospitalization and ongoing recovery are:: to go home   Choice offered to / list presented to : NA  Discharge Placement                       Discharge Plan and Services                  DME Agency: NA                  Social Determinants of Health (SDOH) Interventions     Readmission Risk Interventions No flowsheet data found.

## 2020-11-25 NOTE — Progress Notes (Signed)
OT Cancellation Note  Patient Details Name: Jose Hansen MRN: [id number] DOB: [birth year]   Cancelled Treatment:    Reason Eval/Treat Not Completed: Medical issues which prohibited therapy;Patient not medically ready. Patient with DVT in R common femoral vein started on heparin 11/25/20 at 0341. OT to hold at this time per therapy protocol. Will initiate OT eval 6/30.   Gloris Manchester OTR/L Supplemental OT, Department of rehab services [phone]  Lielle Vandervort R H. 11/25/2020, 7:31 AM

## 2020-11-25 NOTE — Care Management Obs Status (Signed)
[place] NOTIFICATION   Patient Details  Name: Jose Hansen MRN: [id number] Date of Birth: 06/24/1970   Medicare Observation Status Notification Given:  Yes    Carles Collet, RN 11/25/2020, 2:31 PM

## 2020-11-25 NOTE — Progress Notes (Signed)
[place] for Heparin to  Eliquis Indication: DVT  Not on File  Patient Measurements: Weight: 86 kg (189 lb 11.3 oz)  Vital Signs: Temp: 97.7 F (36.5 C) (06/29 0343) Temp Source: Oral (06/29 0343) BP: 108/67 (06/29 0343) Pulse Rate: 65 (06/29 0343)  Labs: Recent Labs    11/24/20 1421 11/24/20 1640 11/24/20 2110 11/25/20 0424  HGB  --   --  13.3  --   HCT  --   --  41.1  --   PLT  --   --  264  --   APTT  --   --   --  35  LABPROT 13.4  --   --   --   INR 1.0  --   --   --   HEPARINUNFRC  --   --   --  0.37  CREATININE 1.12  --   --   --   CKTOTAL 185  --   --   --   TROPONINIHS 2 2  --   --      CrCl cannot be calculated (Unknown ideal weight.).   Assessment: 50 y.o. M presents with L leg weakness. MRI of brain is normal. Pt with h/o DVT (on Eliquis) and has incidental finding of new occlusive DVT in R common femoral vein. ? Compliance, on heparin at admission  Transitioning back to Eliquis   Goal of Therapy:  Monitor platelets by anticoagulation protocol: Yes   Plan:  DC heparin and heparin labs Eliquis 10 mg po BID x 7 days then 5 mg po BID  Anette Guarneri, PharmD Please see amion for complete clinical pharmacist phone list 11/25/2020,9:47 AM

## 2020-11-25 NOTE — Discharge Summary (Signed)
Physician Discharge Summary  Jose Hansen [id number] DOB: 1971-02-13 DOA: 11/24/2020  PCP: Pcp, No  Admit date: 11/24/2020 Discharge date: 11/25/2020  Admitted From: Home Disposition: Home  Recommendations for Outpatient Follow-up:  Follow up with PCP in 1-2 weeks Please obtain BMP/CBC in one week Please follow up with hematologist as discussed  Home Health: None Equipment/Devices: None  Discharge Condition: Stable CODE STATUS: Full Diet recommendation: Low-salt low-fat diet  Brief/Interim Summary: Jose Hansen is a 50 y.o. male with medical history significant of PVD sp R AKA who presents with pain and cramping in left leg and right stump. Weakness in left leg that is new, burning in right arm with episodes of weakness and falling. Reports upper arm burning pain and decreased strength and grip. 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 Says he does not smoke or drink he has been told he has a vascular disease. Pt reports he had hx of CP and had a clot in his heart and needed stent, he also had stents in his legs too - previously done at [facility].  Patient admitted as above with multiple complaints including weakness in the left leg right arm with increased falls as well as atypical burning right arm pain with sensitivity to touch.  It appears patient has moved here from [place] recently with no apparent follow-up.  There is concern that he has been noncompliant with medications which would explain his increased pain as well as occlusive DVT.  Patient is a somewhat poor historian, medications are reviewed at bedside and he was unable to confirm more than a few of these, he is asking for refills which again concerns me for noncompliance.  At any rate his MRI is unremarkable for acute findings, his left lower extremity and right arm weakness are not secondary to stroke per imaging, likely in the setting of deconditioning given he is  essentially wheelchair-bound since his procedure as above.  At this time patient's symptoms appear to be well controlled, recommending close follow-up with PCP in the outpatient setting for further evaluation and treatment of deconditioning, would also recommend increased activity as tolerated.  Will refill patient's home medications including Eliquis gabapentin Seroquel and his statin but strongly recommend he follow closely with PCP for refills of his other medications.  There is some discussion about failure of Eliquis given his DVT, which on imaging does not appear to be acute per radiology, but again given concern for noncompliance and missed medications this is less likely.  Discharge Diagnoses:  Active Problems:   Left leg weakness   DVT (deep venous thrombosis) (HCC)   Essential hypertension   PVD (peripheral vascular disease) ([place])   Hyperlipidemia    Discharge Instructions  Discharge Instructions     Call MD for:  severe uncontrolled pain   Complete by: As directed    Diet - low sodium heart healthy   Complete by: As directed    Increase activity slowly   Complete by: As directed       Allergies as of 11/25/2020   Not on File      Medication List     STOP taking these medications    oxyCODONE 5 MG immediate release tablet Commonly known as: Oxy IR/ROXICODONE   pravastatin 40 MG tablet Commonly known as: PRAVACHOL       TAKE these medications    apixaban 5 MG Tabs tablet Commonly known as: Eliquis Take 1 tablet (5 mg total)  by mouth 2 (two) times daily.   atorvastatin 40 MG tablet Commonly known as: LIPITOR Take 1 tablet (40 mg total) by mouth daily.   clopidogrel 75 MG tablet Commonly known as: PLAVIX Take 1 tablet (75 mg total) by mouth daily.   cyclobenzaprine 10 MG tablet Commonly known as: FLEXERIL Take 1 tablet (10 mg total) by mouth 3 (three) times daily for 20 days.   DULoxetine 60 MG capsule Commonly known as: CYMBALTA Take 2 capsules  (120 mg total) by mouth daily.   HYDROcodone-acetaminophen 5-325 MG tablet Commonly known as: NORCO/VICODIN Take 1 tablet by mouth every 6 (six) hours as needed.   QUEtiapine 50 MG tablet Commonly known as: SEROQUEL Take 3 tablets (150 mg total) by mouth at bedtime.        Not on File  Consultations: None  Procedures/Studies: CT Head Wo Contrast  Result Date: 11/24/2020 CLINICAL DATA:  Acute neuro deficit. EXAM: CT HEAD WITHOUT CONTRAST TECHNIQUE: Contiguous axial images were obtained from the base of the skull through the vertex without intravenous contrast. COMPARISON:  None. FINDINGS: Brain: No evidence of acute infarction, hemorrhage, hydrocephalus, extra-axial collection or mass lesion/mass effect. Vascular: No hyperdense vessel or unexpected calcification. Skull: Normal. Negative for fracture or focal lesion. Sinuses/Orbits: No acute finding. Other: None. IMPRESSION: No acute intracranial abnormality seen. Electronically Signed   By: Marijo Conception M.D.   On: 11/24/2020 14:58   MR BRAIN WO CONTRAST  Result Date: 11/25/2020 CLINICAL DATA:  Frequent falls with left lower extremity weakness EXAM: MRI HEAD WITHOUT CONTRAST TECHNIQUE: Multiplanar, multiecho pulse sequences of the brain and surrounding structures were obtained without intravenous contrast. COMPARISON:  None. FINDINGS: Brain: No acute infarct, mass effect or extra-axial collection. No acute or chronic hemorrhage. Normal white matter signal, parenchymal volume and CSF spaces. The midline structures are normal. Vascular: Major flow voids are preserved. Skull and upper cervical spine: Normal calvarium and skull base. Visualized upper cervical spine and soft tissues are normal. Sinuses/Orbits:No paranasal sinus fluid levels or advanced mucosal thickening. No mastoid or middle ear effusion. Normal orbits. IMPRESSION: Normal brain MRI. Electronically Signed   By: Ulyses Jarred M.D.   On: 11/25/2020 02:07   US Venous Img Lower  Unilateral Left  Result Date: 11/24/2020 CLINICAL DATA:  Left lower extremity edema EXAM: LEFT LOWER EXTREMITY VENOUS DOPPLER ULTRASOUND TECHNIQUE: Gray-scale sonography with compression, as well as color and duplex ultrasound, were performed to evaluate the deep venous system(s) from the level of the common femoral vein through the popliteal and proximal calf veins. COMPARISON:  None. FINDINGS: VENOUS Normal compressibility of the common femoral, superficial femoral, and popliteal veins, as well as the visualized calf veins. Visualized portions of profunda femoral vein and great saphenous vein unremarkable. No filling defects to suggest DVT on grayscale or color Doppler imaging. Doppler waveforms show normal direction of venous flow, normal respiratory plasticity and response to augmentation. Limited views of the contralateral common femoral vein demonstrates occlusive thrombus in the right common femoral vein. OTHER None. Limitations: none IMPRESSION: 1. No evidence of acute DVT in the left lower extremity. 2. Positive for occlusive DVT in the contralateral right common femoral vein. Electronically Signed   By: Jacqulynn Cadet M.D.   On: 11/24/2020 15:28   DG CHEST PORT 1 VIEW  Result Date: 11/24/2020 CLINICAL DATA:  Stroke-like symptoms EXAM: PORTABLE CHEST 1 VIEW COMPARISON:  Radiograph 03/09/2005 FINDINGS: Low lung volumes. Basilar predominant hazy and interstitial opacity is present. Prominence of the cardiomediastinal silhouette may  be related to the low volumes and portable technique. Minimal calcified plaque in the aorta. No pneumothorax. No visible effusion. Remaining soft tissues osseous structures are unremarkable. IMPRESSION: Low volumes with hazy interstitial opacity in the mid to lower lungs, favor atelectasis Prominent cardiac silhouette, may be accentuated by low volumes and portable technique. Aortic Atherosclerosis (ICD10-I70.0). Electronically Signed   By: Lovena Le M.D.   On:  11/24/2020 20:32     Subjective: No issues or events overnight, pain currently well controlled denies nausea vomiting diarrhea constipation headache fevers or chills   Discharge Exam: Vitals:   11/24/20 2006 11/25/20 0343  BP: 123/81 108/67  Pulse: 85 65  Resp: 18 18  Temp: 98 F (36.7 C) 97.7 F (36.5 C)  SpO2: 100% 98%   Vitals:   11/24/20 1700 11/24/20 1800 11/24/20 2006 11/25/20 0343  BP: (!) 173/137 (!) 161/100 123/81 108/67  Pulse: 76 76 85 65  Resp: '13 14 18 18  '$ Temp:   98 F (36.7 C) 97.7 F (36.5 C)  TempSrc:   Oral Oral  SpO2: 98% 97% 100% 98%  Weight:        General: Pt is alert, awake, not in acute distress Cardiovascular: RRR, S1/S2 +, no rubs, no gallops Respiratory: CTA bilaterally, no wheezing, no rhonchi Abdominal: Soft, NT, ND, bowel sounds + Extremities: no edema, no cyanosis; right AKA    The results of significant diagnostics from this hospitalization (including imaging, microbiology, ancillary and laboratory) are listed below for reference.     Microbiology: Recent Results (from the past 240 hour(s))  Resp Panel by RT-PCR (Flu A&B, Covid) Nasopharyngeal Swab     Status: None   Collection Time: 11/24/20  2:21 PM   Specimen: Nasopharyngeal Swab; Nasopharyngeal(NP) swabs in vial transport medium  Result Value Ref Range Status   SARS Coronavirus 2 by RT PCR NEGATIVE NEGATIVE Final    Comment: (NOTE) SARS-CoV-2 target nucleic acids are NOT DETECTED.  The SARS-CoV-2 RNA is generally detectable in upper respiratory specimens during the acute phase of infection. The lowest concentration of SARS-CoV-2 viral copies this assay can detect is 138 copies/mL. A negative result does not preclude SARS-Cov-2 infection and should not be used as the sole basis for treatment or other patient management decisions. A negative result may occur with  improper specimen collection/handling, submission of specimen other than nasopharyngeal swab, presence of viral  mutation(s) within the areas targeted by this assay, and inadequate number of viral copies(<138 copies/mL). A negative result must be combined with clinical observations, patient history, and epidemiological information. The expected result is Negative.  Fact Sheet for Patients:  EntrepreneurPulse.com.au  Fact Sheet for Healthcare Providers:  IncredibleEmployment.be  This test is no t yet approved or cleared by the [place] FDA and  has been authorized for detection and/or diagnosis of SARS-CoV-2 by FDA under an Emergency Use Authorization (EUA). This EUA will remain  in effect (meaning this test can be used) for the duration of the COVID-19 declaration under Section 564(b)(1) of the Act, 21 U.S.C.section 360bbb-3(b)(1), unless the authorization is terminated  or revoked sooner.       Influenza A by PCR NEGATIVE NEGATIVE Final   Influenza B by PCR NEGATIVE NEGATIVE Final    Comment: (NOTE) The Xpert Xpress SARS-CoV-2/FLU/RSV plus assay is intended as an aid in the diagnosis of influenza from Nasopharyngeal swab specimens and should not be used as a sole basis for treatment. Nasal washings and aspirates are unacceptable for Xpert Xpress SARS-CoV-2/FLU/RSV testing.  Fact Sheet for Patients: EntrepreneurPulse.com.au  Fact Sheet for Healthcare Providers: IncredibleEmployment.be  This test is not yet approved or cleared by the [place] FDA and has been authorized for detection and/or diagnosis of SARS-CoV-2 by FDA under an Emergency Use Authorization (EUA). This EUA will remain in effect (meaning this test can be used) for the duration of the COVID-19 declaration under Section 564(b)(1) of the Act, 21 U.S.C. section 360bbb-3(b)(1), unless the authorization is terminated or revoked.  Performed at [facility], [address], [place], [place] [address]      Labs: BNP (last  3 results) No results for input(s): BNP in the last 8760 hours. Basic Metabolic Panel: Recent Labs  Lab 11/24/20 1421 11/24/20 2125  NA 139  --   K 3.7  --   CL 105  --   CO2 26  --   GLUCOSE 119*  --   BUN 11  --   CREATININE 1.12  --   CALCIUM 9.1  --   MG  --  2.2  PHOS  --  4.7*   Liver Function Tests: Recent Labs  Lab 11/24/20 1421  AST 16  ALT 24  ALKPHOS 85  BILITOT 0.4  PROT 7.2  ALBUMIN 4.3   No results for input(s): LIPASE, AMYLASE in the last 168 hours. No results for input(s): AMMONIA in the last 168 hours. CBC: Recent Labs  Lab 11/24/20 2110  WBC 9.5  NEUTROABS 4.9  HGB 13.3  HCT 41.1  MCV 83.4  PLT 264   Cardiac Enzymes: Recent Labs  Lab 11/24/20 1421  CKTOTAL 185   BNP: Invalid input(s): POCBNP CBG: No results for input(s): GLUCAP in the last 168 hours. D-Dimer No results for input(s): DDIMER in the last 72 hours. Hgb A1c No results for input(s): HGBA1C in the last 72 hours. Lipid Profile Recent Labs    11/25/20 0204  CHOL 254*  HDL 29*  LDLCALC 172*  TRIG 264*  CHOLHDL 8.8   Thyroid function studies Recent Labs    11/24/20 2110  TSH 2.337   Anemia work up No results for input(s): VITAMINB12, FOLATE, FERRITIN, TIBC, IRON, RETICCTPCT in the last 72 hours. Urinalysis No results found for: COLORURINE, APPEARANCEUR, [place], [place], GLUCOSEU, [place], [place], [place], PROTEINUR, UROBILINOGEN, NITRITE, LEUKOCYTESUR Sepsis Labs Invalid input(s): PROCALCITONIN,  WBC,  LACTICIDVEN Microbiology Recent Results (from the past 240 hour(s))  Resp Panel by RT-PCR (Flu A&B, Covid) Nasopharyngeal Swab     Status: None   Collection Time: 11/24/20  2:21 PM   Specimen: Nasopharyngeal Swab; Nasopharyngeal(NP) swabs in vial transport medium  Result Value Ref Range Status   SARS Coronavirus 2 by RT PCR NEGATIVE NEGATIVE Final    Comment: (NOTE) SARS-CoV-2 target nucleic acids are NOT DETECTED.  The SARS-CoV-2 RNA is generally  detectable in upper respiratory specimens during the acute phase of infection. The lowest concentration of SARS-CoV-2 viral copies this assay can detect is 138 copies/mL. A negative result does not preclude SARS-Cov-2 infection and should not be used as the sole basis for treatment or other patient management decisions. A negative result may occur with  improper specimen collection/handling, submission of specimen other than nasopharyngeal swab, presence of viral mutation(s) within the areas targeted by this assay, and inadequate number of viral copies(<138 copies/mL). A negative result must be combined with clinical observations, patient history, and epidemiological information. The expected result is Negative.  Fact Sheet for Patients:  EntrepreneurPulse.com.au  Fact Sheet for Healthcare Providers:  IncredibleEmployment.be  This test  is no t yet approved or cleared by the [place] and  has been authorized for detection and/or diagnosis of SARS-CoV-2 by FDA under an Emergency Use Authorization (EUA). This EUA will remain  in effect (meaning this test can be used) for the duration of the COVID-19 declaration under Section 564(b)(1) of the Act, 21 U.S.C.section 360bbb-3(b)(1), unless the authorization is terminated  or revoked sooner.       Influenza A by PCR NEGATIVE NEGATIVE Final   Influenza B by PCR NEGATIVE NEGATIVE Final    Comment: (NOTE) The Xpert Xpress SARS-CoV-2/FLU/RSV plus assay is intended as an aid in the diagnosis of influenza from Nasopharyngeal swab specimens and should not be used as a sole basis for treatment. Nasal washings and aspirates are unacceptable for Xpert Xpress SARS-CoV-2/FLU/RSV testing.  Fact Sheet for Patients: EntrepreneurPulse.com.au  Fact Sheet for Healthcare Providers: IncredibleEmployment.be  This test is not yet approved or cleared by the [place] FDA  and has been authorized for detection and/or diagnosis of SARS-CoV-2 by FDA under an Emergency Use Authorization (EUA). This EUA will remain in effect (meaning this test can be used) for the duration of the COVID-19 declaration under Section 564(b)(1) of the Act, 21 U.S.C. section 360bbb-3(b)(1), unless the authorization is terminated or revoked.  Performed at [facility], [address] [place], [place] [address]      Time coordinating discharge: Over 30 minutes  SIGNED:   Little Ishikawa, DO [facility] 11/25/2020, 12:09 PM Pager   If 7PM-7AM, please contact night-coverage [email or web address]

## 2020-11-25 NOTE — Progress Notes (Signed)
PT Cancellation Note  Patient Details Name: Jose Hansen MRN: [id number] DOB: [birth year]   Cancelled Treatment:    Reason Eval/Treat Not Completed: Medical issues which prohibited therapy;Patient not medically ready - Patient with DVT in R common femoral vein started on heparin 11/25/20 at 0341. PT to hold at this time per therapy protocol. Will initiate PT eval 6/30.   Stacie Glaze, PT DPT Acute Rehabilitation Services Pager [phone]  Office [phone]   Louis Matte 11/25/2020, 8:49 AM

## 2020-11-25 NOTE — Care Management CC44 (Signed)
Condition Code 44 Documentation Completed  Patient Details  Name: Jose Hansen MRN: [id number] Date of Birth: [birth year]   Condition Code 44 given:  Yes Patient signature on Condition Code 44 notice:  Yes Documentation of 2 MD's agreement:  Yes Code 44 added to claim:  Yes    Carles Collet, RN 11/25/2020, 2:31 PM

## 2020-11-26 LAB — HEMOGLOBIN A1C
Hgb A1c MFr Bld: 6 % — ABNORMAL HIGH (ref 4.8–5.6)
Mean Plasma Glucose: 126 mg/dL

## 2020-11-26 LAB — HIV-1 RNA QUANT-NO REFLEX-BLD
HIV 1 RNA Quant: <20 copies/mL
LOG10 HIV-1 RNA: UNDETERMINED log10copy/mL

## 2020-11-26 LAB — ANA W/REFLEX IF POSITIVE: Anti Nuclear Antibody (ANA): NEGATIVE

## 2020-12-03 ENCOUNTER — Ambulatory Visit (INDEPENDENT_AMBULATORY_CARE_PROVIDER_SITE_OTHER): Payer: Medicare Other | Admitting: Vascular Surgery

## 2020-12-03 ENCOUNTER — Encounter: Payer: Self-pay | Admitting: Internal Medicine

## 2020-12-03 ENCOUNTER — Encounter: Payer: Self-pay | Admitting: Vascular Surgery

## 2020-12-03 ENCOUNTER — Other Ambulatory Visit: Payer: Self-pay | Admitting: Internal Medicine

## 2020-12-03 ENCOUNTER — Other Ambulatory Visit: Payer: Self-pay

## 2020-12-03 ENCOUNTER — Other Ambulatory Visit (HOSPITAL_COMMUNITY): Payer: Self-pay

## 2020-12-03 ENCOUNTER — Ambulatory Visit (HOSPITAL_COMMUNITY)
Admission: RE | Admit: 2020-12-03 | Discharge: 2020-12-03 | Disposition: A | Discharge status: Home / Self Care | Payer: Medicare Other | Source: Ambulatory Visit | Attending: Vascular Surgery | Admitting: Vascular Surgery

## 2020-12-03 ENCOUNTER — Ambulatory Visit (INDEPENDENT_AMBULATORY_CARE_PROVIDER_SITE_OTHER): Payer: Medicare Other | Admitting: Internal Medicine

## 2020-12-03 ENCOUNTER — Other Ambulatory Visit (HOSPITAL_COMMUNITY): Payer: Self-pay | Admitting: Vascular Surgery

## 2020-12-03 VITALS — BP 131/91 | HR 81 | Temp 98.2°F | Resp 20 | Ht 71.0 in | Wt 208.0 lb

## 2020-12-03 VITALS — BP 145/90 | HR 98 | Temp 98.2°F | Ht 71.0 in | Wt 208.6 lb

## 2020-12-03 DIAGNOSIS — E785 Hyperlipidemia, unspecified: Secondary | ICD-10-CM

## 2020-12-03 DIAGNOSIS — I739 Peripheral vascular disease, unspecified: Secondary | ICD-10-CM | POA: Diagnosis not present

## 2020-12-03 DIAGNOSIS — M792 Neuralgia and neuritis, unspecified: Secondary | ICD-10-CM

## 2020-12-03 DIAGNOSIS — M79601 Pain in right arm: Secondary | ICD-10-CM

## 2020-12-03 DIAGNOSIS — F32A Depression, unspecified: Secondary | ICD-10-CM

## 2020-12-03 DIAGNOSIS — I10 Essential (primary) hypertension: Secondary | ICD-10-CM | POA: Diagnosis not present

## 2020-12-03 DIAGNOSIS — I82591 Chronic embolism and thrombosis of other specified deep vein of right lower extremity: Secondary | ICD-10-CM

## 2020-12-03 DIAGNOSIS — R29898 Other symptoms and signs involving the musculoskeletal system: Secondary | ICD-10-CM

## 2020-12-03 DIAGNOSIS — R7303 Prediabetes: Secondary | ICD-10-CM

## 2020-12-03 DIAGNOSIS — Z89611 Acquired absence of right leg above knee: Secondary | ICD-10-CM

## 2020-12-03 MED ORDER — DULOXETINE HCL 30 MG PO CPEP
30.0000 mg | ORAL_CAPSULE | Freq: Every day | ORAL | 0 refills | Status: DC
  Filled 2020-12-03: qty 90, 90d supply, fill #0

## 2020-12-03 MED ORDER — HYDROMORPHONE HCL 2 MG PO TABS
1.0000 mg | ORAL_TABLET | Freq: Two times a day (BID) | ORAL | 0 refills | Status: AC | PRN
  Filled 2020-12-03: qty 30, 30d supply, fill #0

## 2020-12-03 MED ORDER — ONDANSETRON HCL 4 MG PO TABS: 4.0000 mg | ORAL_TABLET | Freq: Three times a day (TID) | ORAL | 0 refills | Status: DC | PRN

## 2020-12-03 MED ORDER — ATORVASTATIN CALCIUM 80 MG PO TABS
80.0000 mg | ORAL_TABLET | Freq: Every day | ORAL | 0 refills | Status: DC
  Filled 2020-12-03: qty 90, 90d supply, fill #0

## 2020-12-03 NOTE — Assessment & Plan Note (Signed)
Assessment: Patient endorsed a depressed mood with prior sucidal ideations that he got treatment for. Asked if he had any active SI but patient declined. PHQ9 performed in the clinic on 12/03/2020 showed moderate depression  Plan: -Initiate duloxetine 30 mg daily -Pain control as it is likely contributing to his depression.  -OT consult to provide assistance with ADLs

## 2020-12-03 NOTE — Progress Notes (Deleted)
CC: "my legs and arm hurts"  HPI:  [name] is a 50 y.o. with medical history as below presenting to [facility] for leg and arm pain and as part of hospital follow up.   Please see problem-based list for further details, assessments, and plans.  Past Medical History:  Diagnosis Date   H/O blood clots    Review of Systems:   Review of Systems  Constitutional:  Negative for chills and fever.  HENT:  Negative for hearing loss and tinnitus.   Eyes:  Positive for blurred vision. Negative for photophobia and pain.  Respiratory:  Negative for cough and hemoptysis.   Cardiovascular:  Positive for chest pain. Negative for palpitations and orthopnea.  Gastrointestinal:  Negative for abdominal pain, constipation, diarrhea, heartburn, nausea and vomiting.  Genitourinary:  Negative for dysuria and urgency.  Musculoskeletal:  Negative for myalgias and neck pain.  Skin:  Negative for itching and rash.  Neurological:  Negative for dizziness and headaches.  Endo/Heme/Allergies:  Negative for environmental allergies. Does not bruise/bleed easily.  Psychiatric/Behavioral:  Negative for depression and suicidal ideas.     Physical Exam:  Vitals:   12/03/20 0914 12/03/20 0923  BP: (!) 148/88 (!) 145/90  Pulse: 98   Temp: 98.2 F (36.8 C)   TempSrc: Oral   SpO2: 100%   Weight: 208 lb 9.6 oz (94.6 kg)   Height: '5\' 11"'$  (1.803 m)     Physical Exam Constitutional:      General: He is not in acute distress.    Appearance: Normal appearance. He is normal weight.  HENT:     Head: Normocephalic and atraumatic.     Right Ear: External ear normal.     Left Ear: External ear normal.     Nose: Nose normal. No congestion or rhinorrhea.     Mouth/Throat:     Mouth: Mucous membranes are moist.     Pharynx: Oropharynx is clear. No posterior oropharyngeal erythema.  Eyes:     Extraocular Movements: Extraocular movements intact.     Conjunctiva/sclera: Conjunctivae normal.     Pupils: Pupils  are equal, round, and reactive to light.  Cardiovascular:     Rate and Rhythm: Normal rate and regular rhythm.     Heart sounds: No murmur heard.   No friction rub. No gallop.  Pulmonary:     Effort: Pulmonary effort is normal. No respiratory distress.     Breath sounds: Normal breath sounds. No stridor. No wheezing or rhonchi.  Abdominal:     General: Bowel sounds are normal. There is no distension.     Palpations: Abdomen is soft. There is no mass.  Musculoskeletal:     Right upper arm: Tenderness (burning pain in the arm elicited with movement and upon palpation.) present. No swelling, edema, deformity or lacerations.     Left upper arm: No swelling, edema, deformity, lacerations (burning pain in the arm elicited with movement and upon palpation.) or tenderness.     Cervical back: Normal range of motion and neck supple.     Left lower leg: Laceration (previous laceration runs down the medial side of the leg below the knee.) and tenderness (knee is tender on the lateral side. left leg is very painful below the knee.) present.     Left foot: Tenderness (patient screams in pain upon touching the foot.) present. Abnormal pulse (pulses slightly diminished. Slightly cool to the touch.).     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  Skin:    Coloration: Skin is not jaundiced.     Findings: No rash.  Neurological:     General: No focal deficit present.     Mental Status: He is alert and oriented to person, place, and time. Mental status is at baseline.  Psychiatric:        Mood and Affect: Mood normal.        Behavior: Behavior normal.     Assessment & Plan:   See Encounters Tab for problem based charting.  Patient seen with Dr. Odella Aquas, MD

## 2020-12-03 NOTE — Assessment & Plan Note (Signed)
Assessment: Patient had a A1c of 6.0 while inpatient suggesting he has prediabetes. Discussed lifestyle modification with patient especially with his diet as he eats a lot of fast food.  Plan: -Continue to monitor -Educated on lifestyle modification -Repeat in 3-4 months to see how it trend

## 2020-12-03 NOTE — Patient Instructions (Addendum)
Jose Hansen, it was a pleasure seeing you today!  Today we discussed: Leg Pain: The leg pain was 10/10 and sharp. Pedal pulses non-palpable. ABI was less than 0.51 indicating poor blood flow in the leg. Urgent consult to vascular was made. Dilaudid 1 mg twice daily as needed was also prescribed to you for pain. Aspirin 81 mg was added to your medicine to help prevent future blood clots.  Arm Pain: Arm pain was burning in quality. This appears secondary to heavy crutch use. Duloxetine 30 mg started and PT/OT referral was made.   High blood pressure-Your blood pressure was elevated. We will monitor this as it could be due to pain. Will check it during the next visit.  High cholesterol-Your cholesterol was elevated so your Lipitor was increased to 80 mg daily. We will recheck this later to see if it decreases.    I have ordered the following labs today: CBC, BMP  Lab Orders  No laboratory test(s) ordered today     Tests ordered today:  ABI  Referrals ordered today:    Referral Orders  Ambulatory referral to Vascular Surgery  Ambulatory referral to Physical Therapy    I have ordered the following medication/changed the following medications:   Stop the following medications: Medications Discontinued During This Encounter  Medication Reason   atorvastatin (LIPITOR) 40 MG tablet Dose change   DULoxetine (CYMBALTA) 60 MG capsule Dose change   HYDROcodone-acetaminophen (NORCO/VICODIN) 5-325 MG tablet Completed Course     Start the following medications: Meds ordered this encounter  Medications   DULoxetine (CYMBALTA) 30 MG capsule    Sig: Take 1 capsule (30 mg total) by mouth daily.    Dispense:  90 capsule    Refill:  0   atorvastatin (LIPITOR) 80 MG tablet    Sig: Take 1 tablet (80 mg total) by mouth daily.    Dispense:  90 tablet    Refill:  0   HYDROmorphone (DILAUDID) 2 MG tablet    Sig: Take 0.5 tablets (1 mg total) by mouth every 12 (twelve) hours as needed for  severe pain.    Dispense:  30 tablet    Refill:  0     Follow-up: 4 weeks  Please make sure to arrive 15 minutes prior to your next appointment. If you arrive late, you may be asked to reschedule.   We look forward to seeing you next time. Please call our clinic at [phone] if you have any questions or concerns. The best time to call is Monday-Friday from 9am-4pm, but there is someone available 24/7. If after hours or the weekend, call the main hospital number and ask for the Internal Medicine Resident On-Call. If you need medication refills, please notify your pharmacy one week in advance and they will send [place] a request.  Thank you for letting us take part in your care. Wishing you the best!  Thank you, [name], MD

## 2020-12-03 NOTE — Assessment & Plan Note (Signed)
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 Will look into medical records received to pinpoint exact cause. Left leg was in pain with the left foot being cooler to the touch when compared to the right. Pulse was slightly diminished in the left foot as well but palpable. ABI was performed in the clinic and showed results of less than 0.51. A urgent referral was made to vascular surgery and they will see him today 12/03/2020 at 130pm. Dilaudid prn was initiated to help with the pain.   Plan: -Urgent vascular surgery consult -Dilaudid 1 mg BID prn for pain -Aspirin 81 mg daily -Lipitor increased to 80 mg daily.

## 2020-12-03 NOTE — Assessment & Plan Note (Signed)
Assessment: Left leg was in pain with the left foot being cooler to the touch when compared to the right. Pulse was slightly diminished in the left foot as well but palpable. ABI was performed in the clinic and showed results of less than 0.51. A urgent referral was made to vascular surgery and they will see him today 12/03/2020 at 130pm. Dilaudid prn was initiated to help with the pain. I believe weakness is secondary to the PVD and pain so if that can be resolved the weakness will improve. PT/OT consult placed to help regain strength lost from deconditioning in being in the wheel and mobility assessment.  Plan: -Pain control with dilaudid 1 mg BID prn -Duloxetine 30 mg daily -PT/OT referral and mobility assessement

## 2020-12-03 NOTE — Assessment & Plan Note (Signed)
Assessment: Patient has long history of hyperlipidemia. Lipid panel on 11/24/2020 showed cholesterol: 254, LDL 172, HDL 29. Patient on 40 mg Lipitor.  Plan: Increase Lipitor to 80 mg daily.

## 2020-12-03 NOTE — Assessment & Plan Note (Signed)
Assessment: Patient had a high blood pressure reading this visit. It was high during his inpatient stay as well. Encouraged home blood pressure monitoring. Patient reported no prior history or family history of hypertension.   Plan: -Will continue to monitor as patient states he is in 10/10 pain.   -If elevated in the follow up visit, will start antihypertensive medication.  -Pain control with Dilaudid 1 mg BID prn and Duloxetine -Urgent vascular consult for decreased ABI

## 2020-12-03 NOTE — Assessment & Plan Note (Addendum)
Assessment: Patient has right arm pain that has been present for 1-2 months. DDx include Pain 2/2 to crutches use vs rotator cuff injury vs brachial plexus injury. Most likely is arm pain secondary to crutches use as patient endorsed heavy use of crutches previously. He has right AKA so it is very probable he put more pressure on the right side than the left. Advised him to rest and used padded crutches if possible. Placed PT referral with mobility assessment to see if he can get electric wheel chair.   Plan: -Duloxetine 30 mg daily -PT/OT referral for mobility assessment, improving weakness and assessing ADLs.

## 2020-12-03 NOTE — Progress Notes (Signed)
Referring Physician: Dr. [name]  Patient name: Jose Hansen MRN: [id number] DOB: 1970/06/18 Sex: male  REASON FOR CONSULT: Left leg claudication  HPI: Jose Hansen is a 50 y.o. male, with a previous complicated vascular intervention story all of which was done in [place].  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  He knows that he at least had the vein removed from his left leg.  He is not sure if he had any arterial interventions on the left leg.  For the last 2 years he has been complaining of pain in his left calf when he ambulates with crutches.  He does not walk on a prosthetic leg as he never felt stable on this.  He has had several falls in the past year secondary to his leg becoming weak on the left side.  He gets tightness in his calf after going about a half a block on his crutches and has to rest for several minutes.  He does not have rest pain in the left foot.  He has several neuropathic type symptoms in his left leg including pins-and-needles sensation with radiation to the anterior tibial area of the left leg.  He also has some numbness and tingling around the incision from the saphenectomy in his left calf.  He does not have a wound in his foot.  He was given a prescription today by his primary care physician for 10 mg Dilaudid tablets.  He is on Eliquis and Lipitor and Plavix.   He also complains of phantom pain in his right leg.  He states that previously with iodinated contrast he develops nausea.  He has never had a contrast reaction such as a rash or shortness of breath.  Past Medical History:  Diagnosis Date   H/O blood clots    Past Surgical History:  Procedure Laterality Date   BELOW KNEE LEG AMPUTATION Right 2018    Family History  Problem Relation Age of Onset   CAD Neg Hx    Hypertension Neg Hx     SOCIAL HISTORY: Social History   Socioeconomic History   Marital  status: Single    Spouse name: Not on file   Number of children: Not on file   Years of education: Not on file   Highest education level: Not on file  Occupational History   Not on file  Tobacco Use   Smoking status: Former    Pack years: 0.00    Types: Cigarettes   Smokeless tobacco: Never  Vaping Use   Vaping Use: Never used  Substance and Sexual Activity   Alcohol use: Not Currently   Drug use: Not Currently   Sexual activity: Yes  Other Topics Concern   Not on file  Social History Narrative   Not on file   Social Determinants of Health   Financial Resource Strain: Not on file  Food Insecurity: Not on file  Transportation Needs: Not on file  Physical Activity: Not on file  Stress: Not on file  Social Connections: Not on file  Intimate Partner Violence: Not on file    No Known Allergies  Current Outpatient Medications  Medication Sig Dispense Refill   apixaban (ELIQUIS) 5 MG TABS tablet Take 1 tablet (5 mg total) by mouth 2 (two) times daily. 60 tablet 1   atorvastatin (LIPITOR) 80 MG tablet Take 1 tablet (80 mg total) by mouth daily. 90 tablet 0  clopidogrel (PLAVIX) 75 MG tablet Take 1 tablet (75 mg total) by mouth daily. 30 tablet 1   cyclobenzaprine (FLEXERIL) 10 MG tablet Take 1 tablet (10 mg total) by mouth 3 (three) times daily for 20 days. 60 tablet 0   DULoxetine (CYMBALTA) 30 MG capsule Take 1 capsule (30 mg total) by mouth daily. 90 capsule 0   gabapentin (NEURONTIN) 300 MG capsule Take by mouth.     HYDROmorphone (DILAUDID) 2 MG tablet Take 0.5 tablets (1 mg total) by mouth every 12 (twelve) hours as needed for severe pain. 30 tablet 0   QUEtiapine (SEROQUEL) 50 MG tablet Take 3 tablets (150 mg total) by mouth at bedtime. 90 tablet 1   No current facility-administered medications for this visit.   No Known Allergies   ROS:   General:  No weight loss, Fever, chills  HEENT: No recent headaches, no nasal bleeding, no visual changes, no sore  throat  Neurologic: No dizziness, blackouts, seizures. No recent symptoms of stroke or mini- stroke. No recent episodes of slurred speech, or temporary blindness.  Cardiac: No recent episodes of chest pain/pressure, no shortness of breath at rest.  No shortness of breath with exertion.  Denies history of atrial fibrillation or irregular heartbeat  Vascular: No history of rest pain in feet.  + history of claudication.  No history of non-healing ulcer, No history of DVT   Pulmonary: No home oxygen, no productive cough, no hemoptysis,  No asthma or wheezing  Musculoskeletal:  '[ ]'$  Arthritis, '[ ]'$  Low back pain,  '[ ]'$  Joint pain  Hematologic:No history of hypercoagulable state.  No history of easy bleeding.  No history of anemia  Gastrointestinal: No hematochezia or melena,  No gastroesophageal reflux, no trouble swallowing  Urinary: '[ ]'$  chronic Kidney disease, '[ ]'$  on HD - '[ ]'$  MWF or '[ ]'$  TTHS, '[ ]'$  Burning with urination, '[ ]'$  Frequent urination, '[ ]'$  Difficulty urinating;   Skin: No rashes  Psychological: No history of anxiety,  No history of depression   Physical Examination  Vitals:   12/03/20 1317  BP: (!) 131/91  Pulse: 81  Resp: 20  Temp: 98.2 F (36.8 C)  SpO2: 95%  Weight: 208 lb (94.3 kg)  Height: '5\' 11"'$  (1.803 m)    Body mass index is 29.01 kg/m.  General:  Alert and oriented, no acute distress HEENT: Normal Neck: No JVD Cardiac: Regular Rate and Rhythm  Abdomen: Soft, non-tender, non-distended, obese Skin: No rash Extremity Pulses:  2+ radial, brachial, absent bilateral femoral, absent popliteal dorsalis pedis, posterior tibial pulse left leg Musculoskeletal: Well-healed right above-knee amputation Neurologic: Upper and lower extremity motor 5/5 and symmetric  DATA:  Patient had a left leg ABI performed today which was 0.6 on the left.  Waveforms were dampened monophasic.  ASSESSMENT: Left leg peripheral arterial disease multiple prior revascularizations  elsewhere.  Patient also has chronic pain.  Some of this is neuropathic in nature.  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   PLAN: 1.  Referral to pain management clinic.  Patient was requesting additional pain medication today on top of the 10 mg Dilaudid tablets that had already been prescribed to him.  Believe he has significant chronic pain issues.  2.  Peripheral arterial disease CT angiogram abdomen pelvis with lower extremity runoff to further evaluate the lower extremity arterial tree.  I will prescribe for him some Zofran to take prior to his contrast administration.  Patient was given a  prescription today for Zofran 2 tablets dispensed to take periprocedural for his CT scan to reduce nausea.   Ruta Hinds, MD [facility] Office: [phone]

## 2020-12-03 NOTE — Assessment & Plan Note (Addendum)
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 Request has been made for his prior medical history from [place] and [place] to find out the underlying etiology for the repeated blood clots. Educated patient on starting Aspirin therapy along with his Eliquis.  Plan: -Initiate aspirin 81 mg -Continue Eliquis 5 mg BID -Obtain past medical records

## 2020-12-04 ENCOUNTER — Other Ambulatory Visit: Payer: Self-pay

## 2020-12-04 LAB — CBC WITH DIFFERENTIAL/PLATELET
Basophils Absolute: 0.1 10*3/uL (ref 0.0–0.2)
Basos: 1 %
EOS (ABSOLUTE): 0.4 10*3/uL (ref 0.0–0.4)
Eos: 3 %
Hematocrit: 47 % (ref 37.5–51.0)
Hemoglobin: 15.4 g/dL (ref 13.0–17.7)
Immature Grans (Abs): 0.1 10*3/uL (ref 0.0–0.1)
Immature Granulocytes: 1 %
Lymphocytes Absolute: 3 10*3/uL (ref 0.7–3.1)
Lymphs: 28 %
MCH: 26.6 pg (ref 26.6–33.0)
MCHC: 32.8 g/dL (ref 31.5–35.7)
MCV: 81 fL (ref 79–97)
Monocytes Absolute: 1 10*3/uL — ABNORMAL HIGH (ref 0.1–0.9)
Monocytes: 9 %
Neutrophils Absolute: 6.1 10*3/uL (ref 1.4–7.0)
Neutrophils: 58 %
Platelets: 288 10*3/uL (ref 150–450)
RBC: 5.79 x10E6/uL (ref 4.14–5.80)
RDW: 13.5 % (ref 11.6–15.4)
WBC: 10.5 10*3/uL (ref 3.4–10.8)

## 2020-12-04 LAB — BMP8+ANION GAP
Anion Gap: 24 mmol/L — ABNORMAL HIGH (ref 10.0–18.0)
BUN/Creatinine Ratio: 12 (ref 9–20)
BUN: 11 mg/dL (ref 6–24)
CO2: 18 mmol/L — ABNORMAL LOW (ref 20–29)
Calcium: 9.7 mg/dL (ref 8.7–10.2)
Chloride: 103 mmol/L (ref 96–106)
Creatinine, Ser: 0.95 mg/dL (ref 0.76–1.27)
Glucose: 82 mg/dL (ref 65–99)
Potassium: 4.3 mmol/L (ref 3.5–5.2)
Sodium: 145 mmol/L — ABNORMAL HIGH (ref 134–144)
eGFR: 97 mL/min/{1.73_m2} (ref >59)

## 2020-12-04 MED ORDER — PREDNISONE 50 MG PO TABS: ORAL_TABLET | ORAL | 0 refills | Status: DC

## 2020-12-04 MED ORDER — DIPHENHYDRAMINE HCL 50 MG PO CAPS: ORAL_CAPSULE | ORAL | 0 refills | Status: DC

## 2020-12-04 NOTE — Progress Notes (Signed)
CC: "my legs and arm hurts"  HPI:  Jose Hansen is a 50 y.o. with medical history as below presenting to [facility] for leg and arm pain and as part of hospital follow up.   Please see problem-based list for further details, assessments, and plans.  Past Medical History:  Diagnosis Date   H/O blood clots    Review of Systems:   Review of Systems  Constitutional:  Negative for chills and fever.  HENT:  Negative for hearing loss and tinnitus.   Eyes:  Positive for blurred vision. Negative for photophobia and pain.  Respiratory:  Negative for cough and hemoptysis.   Cardiovascular:  Positive for chest pain. Negative for palpitations and orthopnea.  Gastrointestinal:  Negative for abdominal pain, constipation, diarrhea, heartburn, nausea and vomiting.  Genitourinary:  Negative for dysuria and urgency.  Musculoskeletal:  Negative for myalgias and neck pain.  Skin:  Negative for itching and rash.  Neurological:  Negative for dizziness and headaches.  Endo/Heme/Allergies:  Negative for environmental allergies. Does not bruise/bleed easily.  Psychiatric/Behavioral:  Negative for depression and suicidal ideas.     Physical Exam:  Vitals:   12/03/20 0914 12/03/20 0923  BP: (!) 148/88 (!) 145/90  Pulse: 98   Temp: 98.2 F (36.8 C)   TempSrc: Oral   SpO2: 100%   Weight: 208 lb 9.6 oz (94.6 kg)   Height: '5\' 11"'$  (1.803 m)     Physical Exam Constitutional:      General: He is not in acute distress.    Appearance: Normal appearance. He is normal weight.  HENT:     Head: Normocephalic and atraumatic.     Right Ear: External ear normal.     Left Ear: External ear normal.     Nose: Nose normal. No congestion or rhinorrhea.     Mouth/Throat:     Mouth: Mucous membranes are moist.     Pharynx: Oropharynx is clear. No posterior oropharyngeal erythema.  Eyes:     Extraocular Movements: Extraocular movements intact.     Conjunctiva/sclera: Conjunctivae normal.     Pupils: Pupils  are equal, round, and reactive to light.  Cardiovascular:     Rate and Rhythm: Normal rate and regular rhythm.     Heart sounds: No murmur heard.   No friction rub. No gallop.  Pulmonary:     Effort: Pulmonary effort is normal. No respiratory distress.     Breath sounds: Normal breath sounds. No stridor. No wheezing or rhonchi.  Abdominal:     General: Bowel sounds are normal. There is no distension.     Palpations: Abdomen is soft. There is no mass.  Musculoskeletal:     Right upper arm: Tenderness (burning pain in the arm elicited with movement and upon palpation.) present. No swelling, edema, deformity or lacerations.     Left upper arm: No swelling, edema, deformity, lacerations (burning pain in the arm elicited with movement and upon palpation.) or tenderness.     Cervical back: Normal range of motion and neck supple.     Left lower leg: Laceration (previous laceration runs down the medial side of the leg below the knee.) and tenderness (knee is tender on the lateral side. left leg is very painful below the knee.) present.     Left foot: Tenderness (patient screams in pain upon touching the foot.) present. Abnormal pulse (pulses slightly diminished. Slightly cool to the touch.).     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  Skin:    Coloration: Skin is not jaundiced.     Findings: No rash.  Neurological:     General: No focal deficit present.     Mental Status: He is alert and oriented to person, place, and time. Mental status is at baseline.  Psychiatric:        Mood and Affect: Mood normal.        Behavior: Behavior normal.     Assessment & Plan:   See Encounters Tab for problem based charting.  Patient seen with Dr. Odella Aquas, MD

## 2020-12-10 NOTE — Progress Notes (Signed)
Internal Medicine Clinic Attending  I saw and evaluated the patient.  I personally confirmed the key portions of the history and exam documented by Dr. Khan and I reviewed pertinent patient test results.  The assessment, diagnosis, and plan were formulated together and I agree with the documentation in the resident's note.  

## 2020-12-11 ENCOUNTER — Other Ambulatory Visit: Payer: Self-pay

## 2020-12-11 ENCOUNTER — Ambulatory Visit (HOSPITAL_COMMUNITY)
Admission: RE | Admit: 2020-12-11 | Discharge: 2020-12-11 | Disposition: A | Discharge status: Home / Self Care | Payer: Medicare Other | Source: Ambulatory Visit | Attending: Vascular Surgery | Admitting: Vascular Surgery

## 2020-12-11 DIAGNOSIS — I739 Peripheral vascular disease, unspecified: Secondary | ICD-10-CM | POA: Diagnosis not present

## 2020-12-11 MED ORDER — IOHEXOL 350 MG/ML SOLN
100.0000 mL | Freq: Once | INTRAVENOUS | Status: AC | PRN
  Administered 2020-12-11: 100 mL via INTRAVENOUS

## 2020-12-17 ENCOUNTER — Encounter: Payer: Self-pay | Admitting: Vascular Surgery

## 2020-12-17 ENCOUNTER — Ambulatory Visit (INDEPENDENT_AMBULATORY_CARE_PROVIDER_SITE_OTHER): Payer: Medicare Other | Admitting: Vascular Surgery

## 2020-12-17 ENCOUNTER — Other Ambulatory Visit: Payer: Self-pay

## 2020-12-17 VITALS — BP 114/102 | HR 89 | Temp 98.2°F | Resp 20 | Ht 71.0 in | Wt 208.0 lb

## 2020-12-17 DIAGNOSIS — I739 Peripheral vascular disease, unspecified: Secondary | ICD-10-CM

## 2020-12-17 NOTE — Progress Notes (Signed)
Patient is a 50 year old male with known peripheral arterial disease who returns for follow-up today.  He was previously seen on December 03, 2020.  At that time he was complaining of claudication symptoms in his left leg which were decreasing his ability to ambulate since he has a prior right above-knee amputation.  He develops claudication symptoms after about a half a block in the left leg.  He also has left leg neuropathy with pins and needle sensations around the anterior tibial area of the leg.  he returns today after recent CT angiogram.  He apparently had multiple peripheral vascular interventions done elsewhere.  He returns today after CT scan of the abdomen and pelvis with runoff.  His symptoms are essentially unchanged.  He does state that previously had vein removed from the left leg for previous procedures.  He is on Eliquis Lipitor and Plavix.  He also has right leg phantom pain.  Past Medical History:  Diagnosis Date   H/O blood clots     Past Surgical History:  Procedure Laterality Date   BELOW KNEE LEG AMPUTATION Right 2018    Current Outpatient Medications on File Prior to Visit  Medication Sig Dispense Refill   apixaban (ELIQUIS) 5 MG TABS tablet Take 1 tablet (5 mg total) by mouth 2 (two) times daily. 60 tablet 1   atorvastatin (LIPITOR) 80 MG tablet Take 1 tablet (80 mg total) by mouth daily. 90 tablet 0   clopidogrel (PLAVIX) 75 MG tablet Take 1 tablet (75 mg total) by mouth daily. 30 tablet 1   DULoxetine (CYMBALTA) 30 MG capsule Take 1 capsule (30 mg total) by mouth daily. 90 capsule 0   gabapentin (NEURONTIN) 300 MG capsule Take by mouth.     HYDROmorphone (DILAUDID) 2 MG tablet Take 0.5 tablets (1 mg total) by mouth every 12 (twelve) hours as needed for severe pain. 30 tablet 0   QUEtiapine (SEROQUEL) 50 MG tablet Take 3 tablets (150 mg total) by mouth at bedtime. 90 tablet 1   diphenhydrAMINE (BENADRYL) 50 MG capsule Take 50 mg by mouth 1 hour prior to your procedure.  (Patient not taking: Reported on 12/17/2020) 1 capsule 0   ondansetron (ZOFRAN) 4 MG tablet Take 1 tablet (4 mg total) by mouth every 8 (eight) hours as needed for nausea or vomiting (Take one table one hour prior to CT scan may repeat 8 hours after scan if needed). (Patient not taking: Reported on 12/17/2020) 2 tablet 0   predniSONE (DELTASONE) 50 MG tablet One tablet ('50mg'$ ) 13 hours prior to procedure; one tablet ('50mg'$ ) 7 hours prior to procedure and then one tablet (50 mg) one hour prior to procedure. (Patient not taking: Reported on 12/17/2020) 3 tablet 0   No current facility-administered medications on file prior to visit.    Physical exam:  Vitals:   12/17/20 1459  BP: (!) 114/102  Pulse: 89  Resp: 20  Temp: 98.2 F (36.8 C)  SpO2: 96%  Weight: 208 lb (94.3 kg)  Height: '5\' 11"'$  (1.803 m)    Extremities: Well-healed right above-knee potation  Left lower extremity no open wounds or sores or ulcers palpable pulses  Data: I reviewed the patient's CT angiogram abdominal aorta lower extremity runoff which shows patent left iliac stents.  Patient is occluded from the right iliac all the way down into the right SFA.  Left superficial femoral artery and popliteal artery is occluded.  There is reconstitution of the distal posterior tibial and peroneal artery on the left  leg.  Assessment: Patient is in a difficult situation.  He might potentially be a candidate for a high risk of distal tibial bypass but I would only consider this for a limb threatening situation.  He currently has a reasonable ABI on the left leg.  His bypass would be with prosthetic material since his vein has already been used.  Durability of this would be very short-lived and if the bypass occludes he would be faced with a left above-knee amputation rather than a marginally functioning left leg.  I have discouraged him from proceeding with bypass unless the problems get worse over time.  Plan: Patient will follow up in 6  months time with repeat ABI.  Will be seen in our APP clinic.  Ruta Hinds, MD [facility] Office: [phone]

## 2020-12-21 ENCOUNTER — Other Ambulatory Visit: Payer: Self-pay | Admitting: *Deleted

## 2020-12-21 DIAGNOSIS — I739 Peripheral vascular disease, unspecified: Secondary | ICD-10-CM

## 2020-12-23 LAB — PROTIME-INR

## 2020-12-23 LAB — HEMOGLOBIN A1C: Hemoglobin A1C: 6.1

## 2021-01-04 ENCOUNTER — Other Ambulatory Visit (HOSPITAL_COMMUNITY): Payer: Self-pay

## 2021-01-04 ENCOUNTER — Encounter: Payer: Self-pay | Admitting: Internal Medicine

## 2021-01-04 ENCOUNTER — Ambulatory Visit (INDEPENDENT_AMBULATORY_CARE_PROVIDER_SITE_OTHER): Payer: Medicare Other | Admitting: Internal Medicine

## 2021-01-04 VITALS — BP 144/83 | HR 96 | Temp 98.6°F | Ht 71.0 in | Wt 209.0 lb

## 2021-01-04 DIAGNOSIS — E785 Hyperlipidemia, unspecified: Secondary | ICD-10-CM

## 2021-01-04 DIAGNOSIS — M79601 Pain in right arm: Secondary | ICD-10-CM

## 2021-01-04 DIAGNOSIS — M792 Neuralgia and neuritis, unspecified: Secondary | ICD-10-CM | POA: Diagnosis not present

## 2021-01-04 DIAGNOSIS — G4700 Insomnia, unspecified: Secondary | ICD-10-CM | POA: Diagnosis not present

## 2021-01-04 DIAGNOSIS — R29898 Other symptoms and signs involving the musculoskeletal system: Secondary | ICD-10-CM | POA: Diagnosis not present

## 2021-01-04 DIAGNOSIS — Z89611 Acquired absence of right leg above knee: Secondary | ICD-10-CM | POA: Diagnosis not present

## 2021-01-04 DIAGNOSIS — I10 Essential (primary) hypertension: Secondary | ICD-10-CM | POA: Diagnosis not present

## 2021-01-04 DIAGNOSIS — F32A Depression, unspecified: Secondary | ICD-10-CM

## 2021-01-04 DIAGNOSIS — I739 Peripheral vascular disease, unspecified: Secondary | ICD-10-CM | POA: Diagnosis not present

## 2021-01-04 DIAGNOSIS — I82591 Chronic embolism and thrombosis of other specified deep vein of right lower extremity: Secondary | ICD-10-CM | POA: Diagnosis not present

## 2021-01-04 MED ORDER — GABAPENTIN 300 MG PO CAPS
300.0000 mg | ORAL_CAPSULE | Freq: Three times a day (TID) | ORAL | 3 refills | Status: DC
  Filled 2021-01-04: qty 90, 30d supply, fill #0

## 2021-01-04 MED ORDER — APIXABAN 5 MG PO TABS: 5.0000 mg | ORAL_TABLET | Freq: Two times a day (BID) | ORAL | 1 refills | Status: DC

## 2021-01-04 MED ORDER — DULOXETINE HCL 60 MG PO CPEP
60.0000 mg | ORAL_CAPSULE | Freq: Every day | ORAL | 2 refills | Status: DC
  Filled 2021-01-04: qty 30, 30d supply, fill #0

## 2021-01-04 MED ORDER — DULOXETINE HCL 30 MG PO CPEP
30.0000 mg | ORAL_CAPSULE | Freq: Every day | ORAL | 0 refills | Status: DC
  Filled 2021-01-04: qty 90, 90d supply, fill #0

## 2021-01-04 NOTE — Patient Instructions (Addendum)
Jose Hansen, it was a pleasure seeing you today!  Today we discussed: Pain in left leg- Please follow-up on referral to Physical therapy/ Occupational Therapy.  Continue on Gabapentin 300 mg TID and Duloxetine 30 mg.  Transportation to pharmacy- Please complete the SCAT paperwork to help you secure more transportation options so you can go to the pharmacy. We have given you about a month supply of Xarelto.  Please take this until you are able to go to the pharmacy.  This is taken once a day.    I have ordered the following labs today:  Lab Orders  No laboratory test(s) ordered today     Tests ordered today:  None  Referrals ordered today:    Referral Orders  AMB Referral to [place]    I have ordered the following medication/changed the following medications:   Stop the following medications: Medications Discontinued During This Encounter  Medication Reason   clopidogrel (PLAVIX) 75 MG tablet Change in therapy   apixaban (ELIQUIS) 5 MG TABS tablet Reorder     Start the following medications: Meds ordered this encounter  Medications   apixaban (ELIQUIS) 5 MG TABS tablet    Sig: Take 1 tablet (5 mg total) by mouth 2 (two) times daily.    Dispense:  60 tablet    Refill:  1     Follow-up: 3 months   Please make sure to arrive 15 minutes prior to your next appointment. If you arrive late, you may be asked to reschedule.   We look forward to seeing you next time. Please call our clinic at [phone] if you have any questions or concerns. The best time to call is Monday-Friday from 9am-4pm, but there is someone available 24/7. If after hours or the weekend, call the main hospital number and ask for the Internal Medicine Resident On-Call. If you need medication refills, please notify your pharmacy one week in advance and they will send [place] a request.  Thank you for letting us take part in your care. Wishing you the best!  Thank you, Dr.  [facility]

## 2021-01-04 NOTE — Assessment & Plan Note (Signed)
Patient with history of depressed mood.  He was started on duloxetine 30 mg on 12/03/20.  He states that he has noticed some improvement in his symptoms.  He was taking his duloxetine 30 mg twice a day rather than his prescribed dosage.  Informed patient of the importance of taking medications as prescribed.  Increased his dose to Duloxetine 60 mg for 30 days.  He has multiple physical limitations.  Most of his time is spent in wheelchair.  Currently lives with his brother and sleeps on an air mattress in the living room. He states that he moved in with his brother from [place] following his Right AKA.   -Increased duloxetine 60 mg -Referral to Dr. Theodis Shove -referral was placed at last visit for OT/PT

## 2021-01-04 NOTE — Assessment & Plan Note (Addendum)
Patient endorses continued pain in his right arm.  At home he sometimes uses crutches, but is mostly wheelchair bound.  He doesn't use crutches often now because of increased falls. He used to have a prosthetic limb, but lost it when he moved from [place] to [place]. He is not currently interested in a prosthetic due to having pain in his left leg limiting his mobility.  -Referral made to PT/OT last visit.  Patient states that he has not been yet.

## 2021-01-04 NOTE — Assessment & Plan Note (Addendum)
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  Medical records have been requested from [facility] to see if work-up completed for underlying etiology of clot.   -Continue Aspirin 81 mg -Patient given month supply of Xarelto 10 mg due to not being able to get to pharmacy.  He currently takes a bus that only goes to [employer] facilities, not including the Outpatient pharmacy.  Patient given SCAT paperwork.  Referral placed to Chronic Care Management. -Will continue Eliquis 5 mg BID once he is able to get to pharmacy to pick up his Eliquis -Follow-up in one month

## 2021-01-04 NOTE — Assessment & Plan Note (Signed)
He is not currently on medication for his blood pressure.  It was elevated at his last visit and during his inpatient hospital stay.  He also states that he has chronic pain.  Currently on Duloxetine and Gabapentin without relief. Blood pressure is 144/83 Plan -BMP at next visit and will consider adding BP medications when patient returns in one month

## 2021-01-04 NOTE — Assessment & Plan Note (Addendum)
He returns to clinic with continued pain in his left leg.  He went to see vascular surgery 12/17/20 and they told him that he is potentially a candidate for a high risk distal tibial bypass procedure in a limb-threatening situation. They were concerned that this procedure would be short-lived and if the bypass occludes he would be faced with a left above-knee amputation rather than a marginally functioning left leg.  Patient has not been taking Duloxetine as prescribed.  He was taking two tablets a day and has run out.  Will increase dose to 60 mg, but let patient know that it is important that he takes his medications as prescribed. Also refilled Gabapentin.   A/P -Referral for PT/OT was placed 12/03/20, patient states he has not been yet.  -Duloxetine 60 mg qd -Gabapentin 300 mg TID

## 2021-01-04 NOTE — Progress Notes (Signed)
   CC: Follow-up on peripheral vascular disease and depression  HPI:  [name] is a 50 y.o. with medical history as below presenting to [facility] for follow-up on peripheral vascular disease and depression.  Please see problem-based list for further details, assessments, and plans.  Past Medical History:  Diagnosis Date   H/O blood clots    Review of Systems:  As per HPI  Physical Exam:  Vitals:   01/04/21 1503  BP: (!) 144/83  Pulse: 96  Temp: 98.6 F (37 C)  TempSrc: Oral  SpO2: 100%  Weight: 209 lb (94.8 kg)  Height: '5\' 11"'$  (1.803 m)    General: Well-developed, sitting in wheelchair, in no acute distress HENT: NCAT, no lesions noted Eyes:no scleral icterus, conjunctiva clear CV: No murmurs, normal rate Pulm: CTAB, normal pulmonary effort GI: Normal bowel sounds, no tenderness present MSK: Right AKA, well-healed scar present on left medial calf from previous saphenectomy from years ago Skin: warm and dry Psych: normal mood and affect  Assessment & Plan:   See Encounters Tab for problem based charting.  Patient seen with Dr. Philipp Ovens

## 2021-01-04 NOTE — Assessment & Plan Note (Signed)
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  Medical records have been requested from [facility].  Left leg continues to have pain.  He went to see vascular surgery 12/17/20 and they told him that he is potentially a candidate for a high risk distal tibial bypass procedure in a limb-threatening situation. They were concerned that this procedure would be short-lived and if the bypass occludes he would be faced with a left above-knee amputation rather than a marginally functioning left leg.  Patient has not been taking Duloxetine as prescribed.  He was taking two tablets a day and has run out.  Will increase dose to 60 mg, but let patient know that it is important that he takes his medications as prescribed. Also refilled Gabapentin.   Patient has transportation issues to get to pharmacy and non-[facility] facilities.  He was given SCAT paperwork to complete. A/P -Referral for PT/OT was placed 12/03/20, patient states he has not been yet.  -Duloxetine 60 mg qd -Gabapentin 300 mg TID -Referral to chronic care placed.

## 2021-01-07 ENCOUNTER — Ambulatory Visit: Payer: Medicare Other | Admitting: Behavioral Health

## 2021-01-07 DIAGNOSIS — F331 Major depressive disorder, recurrent, moderate: Secondary | ICD-10-CM

## 2021-01-07 DIAGNOSIS — F419 Anxiety disorder, unspecified: Secondary | ICD-10-CM

## 2021-01-08 ENCOUNTER — Other Ambulatory Visit (HOSPITAL_COMMUNITY): Payer: Self-pay

## 2021-01-08 DIAGNOSIS — G4700 Insomnia, unspecified: Secondary | ICD-10-CM | POA: Insufficient documentation

## 2021-01-08 MED ORDER — APIXABAN 5 MG PO TABS
5.0000 mg | ORAL_TABLET | Freq: Two times a day (BID) | ORAL | 3 refills | Status: DC
  Filled 2021-01-08: qty 60, 30d supply, fill #0
  Filled 2021-02-19: qty 60, 30d supply, fill #1
  Filled 2021-04-01: qty 60, 30d supply, fill #2

## 2021-01-08 MED ORDER — ATORVASTATIN CALCIUM 80 MG PO TABS
80.0000 mg | ORAL_TABLET | Freq: Every day | ORAL | 1 refills | Status: DC
  Filled 2021-01-08: qty 90, 90d supply, fill #0

## 2021-01-08 MED ORDER — QUETIAPINE FUMARATE 50 MG PO TABS
150.0000 mg | ORAL_TABLET | Freq: Every day | ORAL | 3 refills | Status: DC
Start: 2021-01-08 — End: 2021-02-12
  Filled 2021-01-08: qty 90, 30d supply, fill #0

## 2021-01-08 MED ORDER — QUETIAPINE FUMARATE 50 MG PO TABS
150.0000 mg | ORAL_TABLET | Freq: Every day | ORAL | 1 refills | Status: DC
  Filled 2021-01-08: qty 90, 30d supply, fill #0

## 2021-01-08 MED ORDER — DULOXETINE HCL 60 MG PO CPEP
60.0000 mg | ORAL_CAPSULE | Freq: Every day | ORAL | 2 refills | Status: DC
  Filled 2021-01-08: qty 30, 30d supply, fill #0

## 2021-01-08 NOTE — Progress Notes (Signed)
Internal Medicine Clinic Attending  I saw and evaluated the patient.  I personally confirmed the key portions of the history and exam documented by Dr. Howie Ill and I reviewed pertinent patient test results.  The assessment, diagnosis, and plan were formulated together and I agree with the documentation in the resident's note.   Patient is a 50 yo with severe PVD s/p multiple vascular interventions and right AKA in [place] prior to establishing care here. He is wheelchair bound and does not use his prosthetic due to a poor fit and severe claudication pain in the contralateral leg with ambulation. He was seen by [facility] last month and left leg ABI was 0.6. CT angiogram showed patent left iliac stents, but occlusion of the left superficial femoral artery and popliteal artery with reconstitution of the distal left posterior tibial and peroneal artery. Vascular felt he is not currently a candidate for further intervention. Because his vein has already been used, they would have to use prosthetic material if they were to proceed with a distal tibial bypass. This is considered high risk for failure and could potentially leave him with bilateral AKAs.   Unfortunately patient is struggling with medication management and transportation to appointments. He has run out of some of his medications including his eliquis. 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 He reports a large clot in his heart that required surgery to remove. He says he does not have transportation to make it to the pharmacy. He took the bus to this appointment but that is challenging for him without assistance due to his wheelchair. We have placed a referral to our CCM to see what resources we can help him with. He should qualify for SCAT. Since he cannot make it to the pharmacy, we have provided him with samples of Xarelto from our clinic. We only have the 10 mg tablets currently so sent him out on reduced  intensity dosing for indefinite anticoagulation.  Once he is able to get to the pharmacy, he can switch back to Eliquis.   Lastly pain control has been an issue. He is prescribed gabapentin and was recently started on cymbalta for depression. He also received a prescription for PRN dilaudid at his last appointment. Since he is not a candidate for further intervention, continuing opioid therapy seems reasonable. His pain significantly limits his ability to function. Recommend pain contract with his PCP at follow up if this medication is continued.

## 2021-01-08 NOTE — Assessment & Plan Note (Addendum)
ADDENDUM 01/08/21 Patient called reporting hallucinations. He states that he had run out of his Seroquel this week.  Since then, he has been hearing voices and felt more angry.  He denies SI/HI at this time.  Recently moved from [facility] to [place] to be closer to family.  Was following with Psychiatry in [facility] for insomnia.  Vocal hallucinations most likely due to sudden discontinuation of antipsychotic.  Sent in Seroquel 150 mg with refills.  Asked patient to call on Monday to schedule follow-up in one month.

## 2021-01-08 NOTE — Addendum Note (Signed)
Addended by: Edwyna Perfect on: 01/08/2021 12:47 PM   Modules accepted: Orders

## 2021-01-08 NOTE — Addendum Note (Signed)
Addended by: Edwyna Perfect on: 01/08/2021 07:45 AM   Modules accepted: Orders

## 2021-01-08 NOTE — Addendum Note (Signed)
Addended by: Jodean Lima on: 01/08/2021 03:16 PM   Modules accepted: Orders

## 2021-01-12 ENCOUNTER — Other Ambulatory Visit: Payer: Self-pay

## 2021-01-12 ENCOUNTER — Inpatient Hospital Stay (HOSPITAL_COMMUNITY)
Admission: EM | Admit: 2021-01-12 | Discharge: 2021-01-26 | DRG: 253 | Disposition: A | Discharge status: Home Health | Payer: Medicare Other | Attending: Internal Medicine | Admitting: Internal Medicine

## 2021-01-12 ENCOUNTER — Emergency Department (HOSPITAL_COMMUNITY): Payer: Medicare Other

## 2021-01-12 DIAGNOSIS — Z7901 Long term (current) use of anticoagulants: Secondary | ICD-10-CM

## 2021-01-12 DIAGNOSIS — E785 Hyperlipidemia, unspecified: Secondary | ICD-10-CM | POA: Diagnosis present

## 2021-01-12 DIAGNOSIS — Z79899 Other long term (current) drug therapy: Secondary | ICD-10-CM

## 2021-01-12 DIAGNOSIS — G4700 Insomnia, unspecified: Secondary | ICD-10-CM | POA: Diagnosis present

## 2021-01-12 DIAGNOSIS — Z20822 Contact with and (suspected) exposure to covid-19: Secondary | ICD-10-CM | POA: Diagnosis present

## 2021-01-12 DIAGNOSIS — R7303 Prediabetes: Secondary | ICD-10-CM | POA: Diagnosis present

## 2021-01-12 DIAGNOSIS — Z87891 Personal history of nicotine dependence: Secondary | ICD-10-CM

## 2021-01-12 DIAGNOSIS — Z86718 Personal history of other venous thrombosis and embolism: Secondary | ICD-10-CM

## 2021-01-12 DIAGNOSIS — I82511 Chronic embolism and thrombosis of right femoral vein: Secondary | ICD-10-CM | POA: Diagnosis present

## 2021-01-12 DIAGNOSIS — Z993 Dependence on wheelchair: Secondary | ICD-10-CM

## 2021-01-12 DIAGNOSIS — I739 Peripheral vascular disease, unspecified: Secondary | ICD-10-CM | POA: Diagnosis present

## 2021-01-12 DIAGNOSIS — D62 Acute posthemorrhagic anemia: Secondary | ICD-10-CM | POA: Diagnosis not present

## 2021-01-12 DIAGNOSIS — I70222 Atherosclerosis of native arteries of extremities with rest pain, left leg: Principal | ICD-10-CM | POA: Diagnosis present

## 2021-01-12 DIAGNOSIS — D72829 Elevated white blood cell count, unspecified: Secondary | ICD-10-CM | POA: Diagnosis not present

## 2021-01-12 DIAGNOSIS — Z86711 Personal history of pulmonary embolism: Secondary | ICD-10-CM

## 2021-01-12 DIAGNOSIS — K219 Gastro-esophageal reflux disease without esophagitis: Secondary | ICD-10-CM | POA: Diagnosis present

## 2021-01-12 DIAGNOSIS — I998 Other disorder of circulatory system: Secondary | ICD-10-CM | POA: Diagnosis present

## 2021-01-12 DIAGNOSIS — Z7982 Long term (current) use of aspirin: Secondary | ICD-10-CM

## 2021-01-12 DIAGNOSIS — Z95828 Presence of other vascular implants and grafts: Secondary | ICD-10-CM

## 2021-01-12 DIAGNOSIS — Z89611 Acquired absence of right leg above knee: Secondary | ICD-10-CM

## 2021-01-12 DIAGNOSIS — Z89511 Acquired absence of right leg below knee: Secondary | ICD-10-CM

## 2021-01-12 DIAGNOSIS — F32A Depression, unspecified: Secondary | ICD-10-CM | POA: Diagnosis present

## 2021-01-12 DIAGNOSIS — I25118 Atherosclerotic heart disease of native coronary artery with other forms of angina pectoris: Secondary | ICD-10-CM | POA: Diagnosis present

## 2021-01-12 DIAGNOSIS — R0789 Other chest pain: Secondary | ICD-10-CM | POA: Diagnosis present

## 2021-01-12 DIAGNOSIS — Z91041 Radiographic dye allergy status: Secondary | ICD-10-CM

## 2021-01-12 LAB — COMPREHENSIVE METABOLIC PANEL
ALT: 34 U/L (ref 0–44)
AST: 26 U/L (ref 15–41)
Albumin: 4.2 g/dL (ref 3.5–5.0)
Alkaline Phosphatase: 76 U/L (ref 38–126)
Anion gap: 8 (ref 5–15)
BUN: 14 mg/dL (ref 6–20)
CO2: 24 mmol/L (ref 22–32)
Calcium: 9.4 mg/dL (ref 8.9–10.3)
Chloride: 107 mmol/L (ref 98–111)
Creatinine, Ser: 1.13 mg/dL (ref 0.61–1.24)
GFR, Estimated: >60 mL/min (ref >60)
Glucose, Bld: 109 mg/dL — ABNORMAL HIGH (ref 70–99)
Potassium: 3.8 mmol/L (ref 3.5–5.1)
Sodium: 139 mmol/L (ref 135–145)
Total Bilirubin: 0.8 mg/dL (ref 0.3–1.2)
Total Protein: 7.2 g/dL (ref 6.5–8.1)

## 2021-01-12 LAB — CBC WITH DIFFERENTIAL/PLATELET
Abs Immature Granulocytes: 0.04 10*3/uL (ref 0.00–0.07)
Basophils Absolute: 0.1 10*3/uL (ref 0.0–0.1)
Basophils Relative: 1 %
Eosinophils Absolute: 0.3 10*3/uL (ref 0.0–0.5)
Eosinophils Relative: 3 %
HCT: 43.1 % (ref 39.0–52.0)
Hemoglobin: 13.7 g/dL (ref 13.0–17.0)
Immature Granulocytes: 0 %
Lymphocytes Relative: 35 %
Lymphs Abs: 3.5 10*3/uL (ref 0.7–4.0)
MCH: 27.1 pg (ref 26.0–34.0)
MCHC: 31.8 g/dL (ref 30.0–36.0)
MCV: 85.2 fL (ref 80.0–100.0)
Monocytes Absolute: 0.8 10*3/uL (ref 0.1–1.0)
Monocytes Relative: 8 %
Neutro Abs: 5.5 10*3/uL (ref 1.7–7.7)
Neutrophils Relative %: 53 %
Platelets: 266 10*3/uL (ref 150–400)
RBC: 5.06 MIL/uL (ref 4.22–5.81)
RDW: 13.2 % (ref 11.5–15.5)
WBC: 10.1 10*3/uL (ref 4.0–10.5)
nRBC: 0 % (ref 0.0–0.2)

## 2021-01-12 LAB — I-STAT CHEM 8, ED
BUN: 13 mg/dL (ref 6–20)
Calcium, Ion: 1.15 mmol/L (ref 1.15–1.40)
Chloride: 107 mmol/L (ref 98–111)
Creatinine, Ser: 1.1 mg/dL (ref 0.61–1.24)
Glucose, Bld: 97 mg/dL (ref 70–99)
HCT: 39 % (ref 39.0–52.0)
Hemoglobin: 13.3 g/dL (ref 13.0–17.0)
Potassium: 3.9 mmol/L (ref 3.5–5.1)
Sodium: 143 mmol/L (ref 135–145)
TCO2: 25 mmol/L (ref 22–32)

## 2021-01-12 LAB — RESP PANEL BY RT-PCR (FLU A&B, COVID) ARPGX2
Influenza A by PCR: NEGATIVE
Influenza B by PCR: NEGATIVE
SARS Coronavirus 2 by RT PCR: NEGATIVE

## 2021-01-12 LAB — PROTIME-INR
INR: 1 (ref 0.8–1.2)
Prothrombin Time: 13.3 seconds (ref 11.4–15.2)

## 2021-01-12 LAB — TROPONIN I (HIGH SENSITIVITY): Troponin I (High Sensitivity): 4 ng/L (ref <18)

## 2021-01-12 MED ORDER — MORPHINE SULFATE (PF) 4 MG/ML IV SOLN
4.0000 mg | Freq: Once | INTRAVENOUS | Status: AC
  Administered 2021-01-12: 4 mg via INTRAVENOUS
  Filled 2021-01-12: qty 1

## 2021-01-12 MED ORDER — IOHEXOL 350 MG/ML SOLN
100.0000 mL | Freq: Once | INTRAVENOUS | Status: AC | PRN
  Administered 2021-01-12: 100 mL via INTRAVENOUS

## 2021-01-12 MED ORDER — ONDANSETRON HCL 4 MG/2ML IJ SOLN
4.0000 mg | Freq: Once | INTRAMUSCULAR | Status: AC
  Administered 2021-01-12: 4 mg via INTRAVENOUS
  Filled 2021-01-12: qty 2

## 2021-01-12 MED ORDER — HYDROMORPHONE HCL 1 MG/ML IJ SOLN
1.0000 mg | Freq: Once | INTRAMUSCULAR | Status: AC
  Administered 2021-01-12: 1 mg via INTRAVENOUS
  Filled 2021-01-12: qty 1

## 2021-01-12 NOTE — ED Notes (Signed)
Patient transported to CT 

## 2021-01-12 NOTE — ED Triage Notes (Signed)
Pt arrived via [facility] for cc of chest pain, center chest (9/10) and shortness of breath post start of right thigh pain (10/10) earlier today. PMH of lower extremity blood clots.   EMS administered 324 ASA, 1 SL nitro with some relief in chest pain.   EMS Vitals HR 95 BP 144/82 Spo2 97% RR 16-18

## 2021-01-12 NOTE — ED Provider Notes (Signed)
[facility] EMERGENCY DEPARTMENT Provider Note   CSN: [id number] Arrival date & time: 01/12/21  2118     History Chief Complaint  Patient presents with   Chest Pain   Leg Pain   Shortness of Breath    Jose Hansen is a 50 y.o. male history of both arterial and venous emboli. He is presenting with new sharp stabbing chest pain that radiates downwards into legs bilaterally and lower extremity edema and pain that began today.  He has had AKA of right lower extremity. Pain present in left leg as well. Bedside US of LLE with diminished dorsalis pedal pulse though patient reports that this is usual for him. He has had increased swelling and tenderness in his left lower extremity. Warm, and patient able to move his toes. Sensation decreased, but patient reports this is normal for him as well.    Chest Pain Associated symptoms: abdominal pain, diaphoresis, numbness and shortness of breath   Shortness of Breath Associated symptoms: abdominal pain, chest pain and diaphoresis       Past Medical History:  Diagnosis Date   H/O blood clots     Patient Active Problem List   Diagnosis Date Noted   Insomnia 01/08/2021   Right arm pain 12/03/2020   Depression 12/03/2020   Prediabetes 12/03/2020   Left leg weakness 11/24/2020   DVT (deep venous thrombosis) ([place]) 11/24/2020   Essential hypertension 11/24/2020   PVD (peripheral vascular disease) ([place]) 11/24/2020   Hyperlipidemia 11/24/2020    Past Surgical History:  Procedure Laterality Date   BELOW KNEE LEG AMPUTATION Right 2018       Family History  Problem Relation Age of Onset   CAD Neg Hx    Hypertension Neg Hx     Social History   Tobacco Use   Smoking status: Former    Types: Cigarettes   Smokeless tobacco: Never  Vaping Use   Vaping Use: Never used  Substance Use Topics   Alcohol use: Not Currently   Drug use: Not Currently    Home Medications Prior to Admission medications    Medication Sig Start Date End Date Taking? Authorizing Provider  apixaban (ELIQUIS) 5 MG TABS tablet Take 1 tablet (5 mg total) by mouth 2 (two) times daily. 01/08/21   Masters, Joellen Jersey, DO  aspirin EC 81 MG tablet Take 81 mg by mouth daily. Swallow whole.    [provider]  atorvastatin (LIPITOR) 80 MG tablet Take 1 tablet (80 mg total) by mouth daily. 01/08/21 04/08/21  Masters, Katie, DO  DULoxetine (CYMBALTA) 60 MG capsule Take 1 capsule (60 mg total) by mouth daily. 01/08/21   Masters, Katie, DO  gabapentin (NEURONTIN) 300 MG capsule Take 1 capsule (300 mg total) by mouth 3 (three) times daily. 01/04/21   Masters, Katie, DO  QUEtiapine (SEROQUEL) 50 MG tablet Take 3 tablets (150 mg total) by mouth at bedtime. 01/08/21   Masters, Katie, DO    Allergies    Contrast media [iodinated diagnostic agents]  Review of Systems   Review of Systems  Constitutional:  Positive for diaphoresis.  Respiratory:  Positive for shortness of breath.   Cardiovascular:  Positive for chest pain and leg swelling.  Gastrointestinal:  Positive for abdominal pain.  Neurological:  Positive for numbness.   Physical Exam Updated Vital Signs BP 135/90   Pulse 81   Temp 97.9 F (36.6 C) (Oral)   Resp 18   Ht '5\' 11"'$  (1.803 m)   Wt 94.8  kg   SpO2 97%   BMI 29.15 kg/m   Physical Exam Constitutional:      General: He is in acute distress.     Appearance: He is normal weight. He is diaphoretic.  HENT:     Head: Normocephalic and atraumatic.  Cardiovascular:     Rate and Rhythm: Normal rate and regular rhythm.     Heart sounds: Normal heart sounds.  Pulmonary:     Effort: Pulmonary effort is normal.     Breath sounds: Normal breath sounds.  Musculoskeletal:     Left lower leg: Tenderness present. Edema present.     Comments: Right AKA  Neurological:     General: No focal deficit present.     Mental Status: He is alert and oriented to person, place, and time.  Psychiatric:        Mood and Affect:  Mood normal.        Behavior: Behavior normal.    ED Results / Procedures / Treatments   Labs (all labs ordered are listed, but only abnormal results are displayed) Labs Reviewed  COMPREHENSIVE METABOLIC PANEL - Abnormal; Notable for the following components:      Result Value   Glucose, Bld 109 (*)    All other components within normal limits  RESP PANEL BY RT-PCR (FLU A&B, COVID) ARPGX2  CBC WITH DIFFERENTIAL/PLATELET  PROTIME-INR  I-STAT CHEM 8, ED  TROPONIN I (HIGH SENSITIVITY)  TROPONIN I (HIGH SENSITIVITY)    EKG None  Radiology DG Chest Portable 1 View  Result Date: 01/12/2021 CLINICAL DATA:  Chest pain and shortness of breath EXAM: PORTABLE CHEST 1 VIEW COMPARISON:  11/24/2020 FINDINGS: Cardiac shadow is stable. The lungs are hypoinflated but clear. No bony abnormality is noted. IMPRESSION: No active disease. Electronically Signed   By: Inez Catalina M.D.   On: 01/12/2021 22:01    Procedures Procedures   Medications Ordered in ED Medications  ondansetron [facility]) injection 4 mg (4 mg Intravenous Given 01/12/21 2201)  morphine 4 MG/ML injection 4 mg (4 mg Intravenous Given 01/12/21 2201)  ondansetron (ZOFRAN) injection 4 mg (4 mg Intravenous Given 01/12/21 2308)  HYDROmorphone (DILAUDID) injection 1 mg (1 mg Intravenous Given 01/12/21 2308)    ED Course  I have reviewed the triage vital signs and the nursing notes.  Pertinent labs & imaging results that were available during my care of the patient were reviewed by me and considered in my medical decision making (see chart for details).    MDM Rules/Calculators/A&P                           Patient has a history of both arterial and venous emboli. He is presenting with new sharp stabbing chest pain that radiates downwards into legs bilaterally and lower extremity edema and pain that began today. Chest pain 10/10, leg pain "14/10".  He has had AKA of right lower extremity. Pain present in left leg as well. Bedside US  of LLE with diminished dorsalis pedal pulse though patient reports that this is usual for him. He has had increased swelling and tenderness in his left lower extremity. Warm, and patient able to move his toes. Sensation decreased, but patient reports this is normal for him as well.   Concern for possible PE vs arterial thrombus vs dissection. Patient appears uncomfortable, diaphoretic, injected eyes. He is currently hemodynamically stable with regular heart rate and stable BP. CT angio w and w/o contrast and  Dissection study ordered with runoff to evaluate for possible PE, vs arterial thrombus, vs dissection.  PT/INR 13.3 and 1.0 respectively. CBC wnl. Pending imaging findings, may need vascular surgery consult.   Final Clinical Impression(s) / ED Diagnoses Final diagnoses:  None    Rx / DC Orders ED Discharge Orders     None        Delene Ruffini, MD 01/12/21 2326    Tegeler, Gwenyth Allegra, MD 01/15/21 [phone]

## 2021-01-13 ENCOUNTER — Observation Stay (HOSPITAL_BASED_OUTPATIENT_CLINIC_OR_DEPARTMENT_OTHER): Payer: Medicare Other

## 2021-01-13 ENCOUNTER — Encounter (HOSPITAL_COMMUNITY): Payer: Self-pay | Admitting: Internal Medicine

## 2021-01-13 DIAGNOSIS — R0789 Other chest pain: Secondary | ICD-10-CM | POA: Diagnosis present

## 2021-01-13 DIAGNOSIS — R079 Chest pain, unspecified: Secondary | ICD-10-CM

## 2021-01-13 DIAGNOSIS — I739 Peripheral vascular disease, unspecified: Secondary | ICD-10-CM

## 2021-01-13 DIAGNOSIS — Z89611 Acquired absence of right leg above knee: Secondary | ICD-10-CM | POA: Diagnosis not present

## 2021-01-13 DIAGNOSIS — E782 Mixed hyperlipidemia: Secondary | ICD-10-CM

## 2021-01-13 HISTORY — DX: Other chest pain: R07.89

## 2021-01-13 LAB — TROPONIN I (HIGH SENSITIVITY): Troponin I (High Sensitivity): 4 ng/L (ref <18)

## 2021-01-13 LAB — ECHOCARDIOGRAM COMPLETE
Area-P 1/2: 4.6 cm2
Height: 71 in
S' Lateral: 3 cm
Single Plane A4C EF: 54.9 %
Weight: 3344 oz

## 2021-01-13 MED ORDER — PERFLUTREN LIPID MICROSPHERE
1.0000 mL | INTRAVENOUS | Status: AC | PRN
  Administered 2021-01-13: 2 mL via INTRAVENOUS

## 2021-01-13 MED ORDER — SENNOSIDES-DOCUSATE SODIUM 8.6-50 MG PO TABS
1.0000 | ORAL_TABLET | Freq: Every evening | ORAL | Status: DC | PRN
  Administered 2021-01-25: 1 via ORAL
  Filled 2021-01-13: qty 1

## 2021-01-13 MED ORDER — ATORVASTATIN CALCIUM 80 MG PO TABS
80.0000 mg | ORAL_TABLET | Freq: Every day | ORAL | Status: DC
  Administered 2021-01-13 – 2021-01-21 (×9): 80 mg via ORAL
  Filled 2021-01-13 (×3): qty 1
  Filled 2021-01-13: qty 2
  Filled 2021-01-13 (×5): qty 1

## 2021-01-13 MED ORDER — HYDROMORPHONE HCL 2 MG PO TABS
1.0000 mg | ORAL_TABLET | ORAL | Status: DC | PRN
  Administered 2021-01-13 (×3): 1 mg via ORAL
  Filled 2021-01-13 (×4): qty 1

## 2021-01-13 MED ORDER — ACETAMINOPHEN 325 MG PO TABS
650.0000 mg | ORAL_TABLET | Freq: Four times a day (QID) | ORAL | Status: DC | PRN
  Administered 2021-01-13: 650 mg via ORAL
  Filled 2021-01-13: qty 2

## 2021-01-13 MED ORDER — QUETIAPINE FUMARATE 25 MG PO TABS
150.0000 mg | ORAL_TABLET | Freq: Every day | ORAL | Status: DC
  Administered 2021-01-13 – 2021-01-25 (×13): 150 mg via ORAL
  Filled 2021-01-13 (×15): qty 1

## 2021-01-13 MED ORDER — PREDNISONE 20 MG PO TABS: 50.0000 mg | ORAL_TABLET | Freq: Four times a day (QID) | ORAL | Status: DC

## 2021-01-13 MED ORDER — GABAPENTIN 300 MG PO CAPS
300.0000 mg | ORAL_CAPSULE | Freq: Three times a day (TID) | ORAL | Status: DC
  Administered 2021-01-13 – 2021-01-22 (×29): 300 mg via ORAL
  Filled 2021-01-13 (×29): qty 1

## 2021-01-13 MED ORDER — METOPROLOL TARTRATE 100 MG PO TABS
100.0000 mg | ORAL_TABLET | Freq: Once | ORAL | Status: AC
  Administered 2021-01-14: 100 mg via ORAL
  Filled 2021-01-13: qty 1

## 2021-01-13 MED ORDER — DIPHENHYDRAMINE HCL 25 MG PO CAPS: 50.0000 mg | ORAL_CAPSULE | Freq: Once | ORAL | Status: DC

## 2021-01-13 MED ORDER — ONDANSETRON HCL 4 MG/2ML IJ SOLN: 4.0000 mg | Freq: Four times a day (QID) | INTRAMUSCULAR | Status: DC | PRN

## 2021-01-13 MED ORDER — DIPHENHYDRAMINE HCL 50 MG/ML IJ SOLN: 50.0000 mg | Freq: Once | INTRAMUSCULAR | Status: DC

## 2021-01-13 MED ORDER — HYDROMORPHONE HCL 1 MG/ML IJ SOLN
1.0000 mg | Freq: Once | INTRAMUSCULAR | Status: AC
  Administered 2021-01-13: 1 mg via INTRAVENOUS
  Filled 2021-01-13: qty 1

## 2021-01-13 MED ORDER — APIXABAN 5 MG PO TABS
5.0000 mg | ORAL_TABLET | Freq: Two times a day (BID) | ORAL | Status: DC
  Administered 2021-01-13 – 2021-01-14 (×3): 5 mg via ORAL
  Filled 2021-01-13 (×4): qty 1

## 2021-01-13 MED ORDER — ONDANSETRON HCL 4 MG PO TABS: 4.0000 mg | ORAL_TABLET | Freq: Four times a day (QID) | ORAL | Status: DC | PRN

## 2021-01-13 MED ORDER — DULOXETINE HCL 60 MG PO CPEP
60.0000 mg | ORAL_CAPSULE | Freq: Every day | ORAL | Status: DC
  Administered 2021-01-13 – 2021-01-26 (×14): 60 mg via ORAL
  Filled 2021-01-13 (×14): qty 1

## 2021-01-13 MED ORDER — ACETAMINOPHEN 650 MG RE SUPP: 650.0000 mg | Freq: Four times a day (QID) | RECTAL | Status: DC | PRN

## 2021-01-13 MED ORDER — ASPIRIN EC 81 MG PO TBEC
81.0000 mg | DELAYED_RELEASE_TABLET | Freq: Every day | ORAL | Status: DC
  Administered 2021-01-13 – 2021-01-26 (×14): 81 mg via ORAL
  Filled 2021-01-13 (×14): qty 1

## 2021-01-13 NOTE — Progress Notes (Signed)
Chart reviewed for CCTA tomorrow 01/14/21  Chart notes contrast allergy (nausea only), not a true allergy, does not need 13 hr prep medications (prednisone/benadryl).  18g in R AC present (please secure for tomorrows exam)  NPO - sips of water, ice chips okay NO CAFFEINE  Goal HR to scan 55-65bpm - '100mg'$  metoprolol tartrate PO x 1 ordered for 01/14/21 at [facility] [phone] Office  [phone] Cell

## 2021-01-13 NOTE — ED Notes (Signed)
Pt given snacks, apple juice and coke. Denies further needs.

## 2021-01-13 NOTE — ED Notes (Signed)
Pt called out asking when he was going upstairs. I told him that I haven't been able to given report again. He said he needs to pee. I told him we can do a urinal and he said he's not doing that and he'll hold it until he gets upstairs. I told him that might be a bit and he said he didn't care.

## 2021-01-13 NOTE — H&P (Addendum)
Date: 01/13/2021               Patient Name:  Jose Hansen MRN: [id number]  DOB: 1971-04-14 Age / Sex: 50 y.o., male   PCP: [name], MD         Medical Service: Internal Medicine Teaching Service         Attending Physician: Dr. Lucious Groves, DO    First Contact: Dr. Humphrey Rolls Pager: [phone]  Second Contact: Dr. Darrick Meigs Pager: [phone]       After Hours (After 5p/  First Contact Pager: [phone]  weekends / holidays): Second Contact Pager: [phone]   Chief Complaint: chest pain and worsening left leg pain  History of Present Illness:   Jose Hansen is a 50 y.o. male with severe PVD s/p multiple vascular interventions and right AKA, depression, and insomnia with auditory hallucinations who presents today with chief complaint of chest pain and left leg pain.  Yesterday, the patient had onset of stabbing chest pain radiating to his right arm and to his back.  He was not doing anything in particular when this happened.  His chest pain has only been relieved with nitroglycerin which he received here in the ED; denies chest pain currently.  States that this chest pain was similar to episode of chest pain from having a clot in his heart in the past.  Denies SOB, weakness, abdominal pain, dysuria, constipation, diarrhea.  Eating meals did not exacerbate his chest pain.   Patient also endorses chronic left leg pain, however he states that his left leg pain became "unbearable" yesterday.  He describes the pain as stabbing in nature.  Also endorses bilateral lower extremity edema.  Has intermittent left foot cramping, endorses sensation of "ants crawling" in his foot when this happens.  He reports that he has been seen in the past by Dr. Oneida Alar from vascular surgery; at a previous visit in July 2022, discussed that he was not a candidate for distal tibial bypass (see below).  He does report 1 week history of blurred vision, but he denies eye pain, floaters, or pain with EOM.    July  2022 encounter with Dr. Oneida Alar: Left leg ABI was 0.6. CT angiogram showed patent left iliac stents, but occlusion of the left superficial femoral artery and popliteal artery with reconstitution of the distal left posterior tibial and peroneal artery. Vascular felt he is not currently a candidate for further intervention. Because his vein has already been used, they would have to use prosthetic material if they were to proceed with a distal tibial bypass. This is considered high risk for failure and could potentially leave him with bilateral AKAs.   Meds:  Seroquel 150 mg qhs Eliquis 5 mg BID Atorvastatin 80 mg daily Duloxetine 60 mg daily Gabapentin 300 TID Aspirin 81 mg daily Current Meds  Medication Sig   apixaban (ELIQUIS) 5 MG TABS tablet Take 1 tablet (5 mg total) by mouth 2 (two) times daily.   aspirin EC 81 MG tablet Take 81 mg by mouth daily. Swallow whole.   atorvastatin (LIPITOR) 80 MG tablet Take 1 tablet (80 mg total) by mouth daily.   DULoxetine (CYMBALTA) 60 MG capsule Take 1 capsule (60 mg total) by mouth daily.   gabapentin (NEURONTIN) 300 MG capsule Take 1 capsule (300 mg total) by mouth 3 (three) times daily.   QUEtiapine (SEROQUEL) 50 MG tablet Take 3 tablets (150 mg total) by mouth at bedtime.    Allergies:  Allergies as of 01/12/2021 - Review Complete 01/04/2021  Allergen Reaction Noted   Contrast media [iodinated diagnostic agents]  12/04/2020   Past Medical History:  Diagnosis Date   H/O blood clots     Family History: No significant family history  Social History: Lives with his brother here. Moved from [place]. Moves around with wheelchair. Denies ETOH use, cigarette or illicit drug use.  Endorses overall healthy diet, eats lots of fruit   Review of Systems: A complete ROS was negative except as per HPI.   Physical Exam: Blood pressure (!) 136/94, pulse 72, temperature 97.9 F (36.6 C), temperature source Oral, resp. rate 11, height '5\' 11"'$  (1.803 m),  weight 94.8 kg, SpO2 94 %.  General: Pleasant, well-appearing male laying in bed. No acute distress. Head: Normocephalic. Atraumatic. CV: RRR. No murmurs, rubs, or gallops. +2 Bilateral pitting edema. No JVD. Pulmonary: Lungs CTAB. Normal effort. No wheezing or rales. Abdominal: Soft, nontender, nondistended. Normal bowel sounds. Extremities: Right AKA.  Unable to detect dorsalis pedis pulse with Doppler, able to detect posterior tibial pulse with Doppler at bedside Skin: Warm and dry. No obvious rash or lesions. Psych: Normal mood and affect Neurologic exam: Mental status: A&Ox3 Cranial Nerves:             II: PERRL             III, IV, VI: Extra-occular motions intact bilaterally             V, VII: Face symmetric, sensation intact in all 3 divisions               IX, X: palate rises symmetrically             XII: tongue midline    Sensory: Light touch intact and symmetric bilaterally   EKG: personally reviewed my interpretation is normal sinus rhythm  CXR: personally reviewed my interpretation is unremarkable  CT Angio Aortobifemoral W &/or WO contrast, 08/16: IMPRESSION: VASCULAR The overall appearance of these findings is stable from the prior Exam in July 2022.  CT angio Chest/Abd/Pelv, 08/17: IMPRESSION: No evidence of thoracoabdominal aortic aneurysm or dissection.   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 No evidence of acute pulmonary embolism.    Assessment & Plan by Problem: Jose Hansen is a 50 y.o. male with severe PVD s/p multiple vascular interventions and right AKA, depression, and insomnia with auditory hallucinations who was admitted for further work-up of his worsening left leg pain and CP.  Active Problems:   Chest pain  #Atypical angina The patient presents here with 1 day history of stabbing chest pain, radiating to his back and to his right arm and not associated with exertion. Has h/o severe PVD s/p multiple vascular  interventions. EMS administered 324 ASA, 1 sublingual nitro with resolution of his CP. Patient continues to be HDS. PT/INR 13.3 and 1.0, respectively. CBC wnl. Lipid panel from last month revealed LDL 172, TG 264. CTA and dissection study with runoff was unremarkable for PE, arterial thrombus, or dissection; imaging is overall stable from prior last month. Troponin x2 WNL. EKG with no ischemic changes. TIMI score for UA/STEMI of 2: 8% risk at 14 days of all-cause mortality, new or recurrent MI, or severe recurrent ischemia requiring urgent revascularization. Given h/o cardiac risk factors (severe PVD and HLD), chest pain is likely 2/2 CAD, though can consider cardiology consult in the AM for further work-up. - Consider cardiology consult in the AM - Telemetry -  ASA 81 mg daily - Lipitor 80 mg daily - Tylenol PRN - Zofran PRN  #Left leg pain #H/o severe PVD and right AKA The patient endorses left leg pain and swelling which has worsened since yesterday.  He has a history of severe PVD status post multiple vascular interventions and has a right AKA. He is followed by Dr. Oneida Alar from vascular surgery, most recent visit was last month (see above); at that time, he was not deemed a candidate for further intervention.  On exam today, he does have +2 pitting edema in the left leg; only able to detect posterior tibialis pulse on bedside Doppler, though this is normal for the patient. Dissection study with runoff was unremarkable for acute thrombus, stable compared to prior from one month ago.  Left leg pain likely solely 2/2 his severe PVD as further work-up for acute changes such as DVT has been unremarkable. We will consult vascular surgery tomorrow for any further recommendations and continue with pain management. - Vascular consult in the AM, appreciate their recommendations - Continue Eliquis 5 mg, twice daily - Continue Gabapentin 300 mg 3 times daily  #Depression -Duloxetine 60 mg  daily  #Insomnia #H/o auditory hallucinations - Continue Seroquel 150 mg   Dispo: Admit patient to Observation with expected length of stay less than 2 midnights.  Signed: Orvis Brill, MD 01/13/2021, 3:50 AM  Pager: [phone] After 5pm on weekdays and 1pm on weekends: On Call pager: [phone]

## 2021-01-13 NOTE — ED Notes (Signed)
Returned from vascular

## 2021-01-13 NOTE — ED Notes (Signed)
Pt crutched to restroom. Transport here for pt. Taken up to room via wheelchair.

## 2021-01-13 NOTE — ED Notes (Signed)
Attempted to call report

## 2021-01-13 NOTE — Consult Note (Addendum)
Cardiology Consultation:   Patient ID: Jose Hansen; [id number]; [birth year]   Admit date: 01/12/2021 Date of Consult: 01/13/2021  Primary Care Provider: [name], MD Primary Cardiologist: New to [facility]  Patient Profile:   Jose Hansen is a 50 y.o. male with a hx of severe PAD s/p multiple PV interventions with right AKA in [place] now with left leg claudication symptoms who is followed by Dr. 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 01/04/21) due to compliance and cost, depression, and HLD who is being seen today for the evaluation of chest pain at the request of Dr. Lorin Glass.  History of Present Illness:   Jose Hansen is a 50yo M with a hx as stated above who presented to [facility] 01/12/21 with acute onset of sharp anterior chest pain, bilateral lower extremity pain, hx of recent SOB and fatigue. Given his symptoms, EMS was called and he was transpoted to the ED for further evaluation. En route he was given SL NTG and 325 ASA with chest pain relief however continues to have significant left LE pain.   In the ED, labs appeared to be unremarkable. CXR with no active disease No evidence of thoracoabdominal aortic aneurysm or dissection. Chest/abdomen CT with chronic calcified thrombus in the right main pulmonary artery. No evidence of acute pulmonary embolism. No evidence of abdominal aortic aneurysm or dissection. Stent in the lower aorta just above the bifurcation extending to the left common iliac artery. EKG stable with no acute changes. HsT 4>>4. Reports no personal or family hx of CAD. Denies tobacco or alcohol use. He denies prior heme workup. Previous care in [facility] and patient recently moved to the area approximately 1 year ago to live with his brother.   He has a complex vascular hx that includes multiple right LE surgeries and interventions performed in [facility] with ultimate right AKA. He was seen by Dr. Oneida Alar  12/03/20 for the evaluation of left leg claudication and neuropathy. CT angiogram was performed which showed patent left iliac stents, occluded from the right iliac all the way down into the right SFA, and left superficial femoral artery and popliteal artery occlusions. He was felt to possibly be a candidate for high risk of distal tibial bypass however would only consider in the setting of limb threatening circumstances. ABIs were felt to be reasonable. Due to prior vein use, prosthetic material would also need to be used which has poorer durability and risks subsequent occlusion and subsequent left AKA as well.   He was then seen in follow up with the OP Resident [facility] here at [facility]. At that time he continued to report lower leg pain, paresthesias and claudication symptoms. He reported difficulty getting to and from appointments due to transportation issues. He also reported difficulty obtaining his medications also due to transportation and cost. At that time, he reported being out of his Eliquis in several days at that point. He was given sample doses of Xarelto however these were noted to be .   Past Medical History:  Diagnosis Date   H/O blood clots     Past Surgical History:  Procedure Laterality Date   BELOW KNEE LEG AMPUTATION Right 2018     Prior to Admission medications   Medication Sig Start Date End Date Taking? Authorizing Provider  apixaban (ELIQUIS) 5 MG TABS tablet Take 1 tablet (5 mg total) by mouth 2 (two) times daily. 01/08/21  Yes Masters,  Katie, DO  aspirin EC 81 MG tablet Take 81 mg by mouth daily. Swallow whole.   Yes [provider]  atorvastatin (LIPITOR) 80 MG tablet Take 1 tablet (80 mg total) by mouth daily. 01/08/21 04/08/21 Yes Masters, Katie, DO  DULoxetine (CYMBALTA) 60 MG capsule Take 1 capsule (60 mg total) by mouth daily. 01/08/21  Yes Masters, Katie, DO  gabapentin (NEURONTIN) 300 MG capsule Take 1 capsule (300 mg total) by mouth 3 (three) times daily.  01/04/21  Yes Masters, Katie, DO  QUEtiapine (SEROQUEL) 50 MG tablet Take 3 tablets (150 mg total) by mouth at bedtime. 01/08/21  Yes Masters, Scientist, research (physical sciences), DO    Inpatient Medications: Scheduled Meds:  apixaban  5 mg Oral BID   aspirin EC  81 mg Oral Daily   atorvastatin  80 mg Oral Daily   DULoxetine  60 mg Oral Daily   gabapentin  300 mg Oral TID   QUEtiapine  150 mg Oral QHS   Continuous Infusions:  PRN Meds: acetaminophen **OR** acetaminophen, HYDROmorphone, ondansetron **OR** ondansetron (ZOFRAN) IV, senna-docusate  Allergies:    Allergies  Allergen Reactions   Contrast Media [Iodinated Diagnostic Agents] Nausea Only    NOT AN ALLERGY. ONLY NAUSEA. 12/11/20  Newly reported by [facility] on 12/04/20.    Social History:   Social History   Socioeconomic History   Marital status: Single    Spouse name: Not on file   Number of children: Not on file   Years of education: Not on file   Highest education level: Not on file  Occupational History   Not on file  Tobacco Use   Smoking status: Former    Types: Cigarettes   Smokeless tobacco: Never  Vaping Use   Vaping Use: Never used  Substance and Sexual Activity   Alcohol use: Not Currently   Drug use: Not Currently   Sexual activity: Yes  Other Topics Concern   Not on file  Social History Narrative   Not on file   Social Determinants of Health   Financial Resource Strain: Not on file  Food Insecurity: Not on file  Transportation Needs: Not on file  Physical Activity: Not on file  Stress: Not on file  Social Connections: Not on file  Intimate Partner Violence: Not on file    Family History:   Family History  Problem Relation Age of Onset   CAD Neg Hx    Hypertension Neg Hx    Family Status:  Family Status  Relation Name Status   Neg Hx  (Not Specified)    ROS:  Please see the history of present illness.  All other ROS reviewed and negative.     Physical Exam/Data:   Vitals:   01/13/21 0700  01/13/21 0900 01/13/21 1000 01/13/21 1100  BP: (!) 143/111 (!) 135/91 (!) 141/94 121/74  Pulse: 80 77 76 78  Resp: 18 18    Temp: 97.9 F (36.6 C)     TempSrc: Oral     SpO2: 96% 98% 96% 97%  Weight:      Height:       No intake or output data in the 24 hours ending 01/13/21 1110 Filed Weights   01/12/21 2123  Weight: 94.8 kg   Body mass index is 29.15 kg/m.   General: Well developed, well nourished, NAD Neck: Negative for carotid bruits. No JVD Lungs:Clear to ausculation bilaterally. No wheezes, rales, or rhonchi. Breathing is unlabored. Cardiovascular: RRR with S1 S2. No murmurs Abdomen: Soft, non-tender,  non-distended. No obvious abdominal masses. Extremities: No edema. Radial pulses 2+ bilaterally Neuro: Alert and oriented. No focal deficits. No facial asymmetry. MAE spontaneously. Psych: Responds to questions appropriately with normal affect.    EKG:  The EKG was personally reviewed and demonstrates:  01/12/21 NSR with no acute ST changes. HR 90bpm  Telemetry:  Telemetry was personally reviewed and demonstrates: 01/13/21 NSR with HR 70's   Relevant CV Studies:  Echocardiogram pending results  Laboratory Data:  Chemistry Recent Labs  Lab 01/12/21 2145 01/12/21 2229  NA 139 143  K 3.8 3.9  CL 107 107  CO2 24  --   GLUCOSE 109* 97  BUN 14 13  CREATININE 1.13 1.10  CALCIUM 9.4  --   GFRNONAA >60  --   ANIONGAP 8  --     Total Protein  Date Value Ref Range Status  01/12/2021 7.2 6.5 - 8.1 g/dL Final   Albumin  Date Value Ref Range Status  01/12/2021 4.2 3.5 - 5.0 g/dL Final   AST  Date Value Ref Range Status  01/12/2021 26 15 - 41 U/L Final   ALT  Date Value Ref Range Status  01/12/2021 34 0 - 44 U/L Final   Alkaline Phosphatase  Date Value Ref Range Status  01/12/2021 76 38 - 126 U/L Final   Total Bilirubin  Date Value Ref Range Status  01/12/2021 0.8 0.3 - 1.2 mg/dL Final   Hematology Recent Labs  Lab 01/12/21 2145 01/12/21 2229  WBC  10.1  --   RBC 5.06  --   HGB 13.7 13.3  HCT 43.1 39.0  MCV 85.2  --   MCH 27.1  --   MCHC 31.8  --   RDW 13.2  --   PLT 266  --    Cardiac EnzymesNo results for input(s): TROPONINI in the last 168 hours. No results for input(s): TROPIPOC in the last 168 hours.  BNPNo results for input(s): BNP, PROBNP in the last 168 hours.  DDimer No results for input(s): DDIMER in the last 168 hours. TSH:  Lab Results  Component Value Date   TSH 2.337 11/24/2020   Lipids: Lab Results  Component Value Date   CHOL 254 (H) 11/25/2020   HDL 29 (L) 11/25/2020   LDLCALC 172 (H) 11/25/2020   TRIG 264 (H) 11/25/2020   CHOLHDL 8.8 11/25/2020   HgbA1c: Lab Results  Component Value Date   HGBA1C 6.0 (H) 11/25/2020    Radiology/Studies:  CT Angio Aortobifemoral W and/or Wo Contrast  Result Date: 01/12/2021 CLINICAL DATA:  History of occluded right iliac system and previous right above the knee amputation with leg pain and back pain, initial encounter EXAM: CT ANGIOGRAPHY OF ABDOMINAL AORTA WITH ILIOFEMORAL RUNOFF TECHNIQUE: Multidetector CT imaging of the abdomen, pelvis and lower extremities was performed using the standard protocol during bolus administration of intravenous contrast. Multiplanar CT image reconstructions and MIPs were obtained to evaluate the vascular anatomy. CONTRAST:  117m OMNIPAQUE IOHEXOL 350 MG/ML SOLN COMPARISON:  None. FINDINGS: VASCULAR Aorta: No significant atherosclerotic calcifications are noted. Mild eccentric atheromatous plaque is noted without focal stenosis. Celiac: Patent without evidence of aneurysm, dissection, vasculitis or significant stenosis. SMA: Patent without evidence of aneurysm, dissection, vasculitis or significant stenosis. Renals: Both renal arteries are patent without evidence of aneurysm, dissection, vasculitis, fibromuscular dysplasia or significant stenosis. IMA: Patent without evidence of aneurysm, dissection, vasculitis or significant stenosis. RIGHT  Lower Extremity Inflow: Long segment occlusion of the common iliac, internal iliac and external iliac  arteries on the right are seen. Runoff: Common femoral and superficial femoral artery are occluded. Profundus femoral branches are noted via collateralization. The overall appearance is similar to that seen on the prior exam. LEFT Lower Extremity Inflow: Extensive arterial stents are identified throughout the common iliac artery on the left extending to the level of the iliac bifurcation. The external and internal iliac arteries are widely patent without focal hemodynamically significant stenosis. Atherosclerotic calcifications are noted in the common femoral artery. Runoff: Superficial femoral artery is significantly small with occlusion in the mid thighs similar to that seen on the prior exam. Profundus femoral branches are noted. The superficial femoral artery occlusion extends into the mid popliteal artery where there is reconstitution just above the knee joint again similar to that seen on the prior exam. The popliteal artery however occludes just below the knee joint with some collateral flow into the musculature reconstitution of the posterior tibial artery is identified which continues into the foot similar to that seen on the prior exam. Diminutive anterior tibial and peroneal arteries are identified with segmental areas of reconstitution. No significant visualization of the distal anterior tibial artery is noted. Veins: No specific venous abnormality is noted. Review of the MIP images confirms the above findings. NON-VASCULAR Nonvascular structures of lower extremities demonstrate evidence of right above the knee amputation. The musculature in left lower extremity appears within normal limits. No significant soft tissue abnormality or bony abnormality is noted. The remainder of the non vascular structures will be covered on the concurrent CTA of the chest abdomen and pelvis. IMPRESSION: VASCULAR Stable  long segment occlusion involving the right iliac arterial system and extending into the common femoral and superficial femoral artery. Patent left common iliac artery stent. Long segment left superficial femoral artery occlusion and proximal popliteal occlusion. The more distal popliteal artery is occluded following reconstitution with multiple muscular collaterals reconstituting single-vessel runoff via the posterior tibial artery to the ankle. The overall appearance of these findings is stable from the prior exam. NON-VASCULAR Nonvascular structures of the lower extremities are within normal limits with the exception of changes of prior above knee amputation on the right. Electronically Signed   By: Inez Catalina M.D.   On: 01/12/2021 23:59   DG Chest Portable 1 View  Result Date: 01/12/2021 CLINICAL DATA:  Chest pain and shortness of breath EXAM: PORTABLE CHEST 1 VIEW COMPARISON:  11/24/2020 FINDINGS: Cardiac shadow is stable. The lungs are hypoinflated but clear. No bony abnormality is noted. IMPRESSION: No active disease. Electronically Signed   By: Inez Catalina M.D.   On: 01/12/2021 22:01   CT Angio Chest/Abd/Pel for Dissection W and/or Wo Contrast  Result Date: 01/13/2021 CLINICAL DATA:  Chest pain, evaluate for dissection. EXAM: CT ANGIOGRAPHY CHEST, ABDOMEN AND PELVIS TECHNIQUE: Non-contrast CT of the chest was initially obtained. Multidetector CT imaging through the chest, abdomen and pelvis was performed using the standard protocol during bolus administration of intravenous contrast. Multiplanar reconstructed images and MIPs were obtained and reviewed to evaluate the vascular anatomy. CONTRAST:  16m OMNIPAQUE IOHEXOL 350 MG/ML SOLN COMPARISON:  Concurrent aortobifemoral runoff. Prior aortobifemoral runoff dated 12/11/2020. FINDINGS: CTA CHEST FINDINGS Cardiovascular: On unenhanced CT, there is no evidence of intramural hematoma. Preferential opacification of the thoracic aorta. No evidence of  thoracic aortic aneurysm or dissection. Calcified thrombus within the right main pulmonary artery unenhanced CT (series 4/image 26), chronic. Although not tailored for evaluation of the pulmonary arteries, there is no evidence of acute pulmonary embolism to the  lobar level. The heart is normal in size.  No pericardial effusion. Mediastinum/Nodes: No suspicious mediastinal lymphadenopathy. Visualized thyroid is unremarkable. Lungs/Pleura: Mild dependent atelectasis in the posterior upper and lower lobes. No suspicious pulmonary nodules. No focal consolidation. Trace right pleural fluid. No pneumothorax. Musculoskeletal: Visualized osseous structures are within normal limits. Review of the MIP images confirms the above findings. CTA ABDOMEN AND PELVIS FINDINGS VASCULAR Aorta: No evidence of abdominal aortic aneurysm or dissection. Stent in the lower aorta just above the bifurcation extending to the left common iliac artery. Celiac: Patent. SMA: Patent. Renals: Patent bilaterally. IMA: Patent. Inflow: Long segment occlusion starting with the right common iliac artery, described on dedicated runoff. Veins: Unremarkable. Review of the MIP images confirms the above findings. NON-VASCULAR Hepatobiliary: Liver is within normal limits. Gallbladder is unremarkable. No intrahepatic or extrahepatic ductal dilatation. Pancreas: Within normal limits. Spleen: Heterogeneous arterial enhancement, within normal limits. Adrenals/Urinary Tract: Adrenal glands are within normal limits. Kidneys are within normal limits.  No hydronephrosis. Bladder is within normal limits. Stomach/Bowel: Stomach is within normal limits. No evidence of bowel obstruction. Normal appendix (series 7/image 243). No colonic wall thickening or inflammatory changes. Lymphatic: No suspicious abdominopelvic lymphadenopathy. Reproductive: Prostate is unremarkable. Other: No abdominopelvic ascites. Postsurgical changes in the left inguinal region. Musculoskeletal:  Sclerotic lesions in the bilateral iliac bones, including a 15 mm lesion on the right (series 7/image 246), unchanged. Visualized osseous structures are otherwise within normal limits. Review of the MIP images confirms the above findings. IMPRESSION: No evidence of thoracoabdominal aortic aneurysm or dissection. 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 No evidence of acute pulmonary embolism. Refer to dedicated abdominal runoff report for additional pertinent vascular findings. Electronically Signed   By: Julian Hy M.D.   On: 01/13/2021 00:15    Assessment and Plan:   1. Atypical chest pain with questionable hx of LV thrombus: -Pt presented to [facility] 01/12/21 with acute onset of sharp anterior chest pain, bilateral lower extremity pain, hx of recent SOB and fatigue. En route he was given SL NTG and 325 ASA with chest pain relief however continues to have significant left LE pain.  -CXR with no active disease -Chest/abdomen CT with chronic calcified thrombus in the right main pulmonary artery. No evidence of acute pulmonary embolism. No evidence of abdominal aortic aneurysm or dissection. Stent in the lower aorta just above the bifurcation extending to the left common iliac artery.  -EKG stable with no acute changes.  -HsT 4>>4.  -Reports no personal or family hx of CAD. Denies tobacco or alcohol use. He denies prior heme workup. Previous care in [facility] and patient recently moved to the area approximately 1 year ago to live with his brother.  -Prior health records requested from [place] for review  -Given significant vascular disease and chest pain relived with SL NTG, will plan for CCTA tomorrow 01/14/21. NPO after MDN  2. Hx of severe PAD s/p right AKA and ongoing left claudication symptoms: -He has a complex vascular hx that includes multiple right LE surgeries and interventions performed in [facility] with ultimate right AKA. He was seen by Dr. Oneida Alar 12/03/20 for the evaluation of left  leg claudication and neuropathy.  -CT angiogram was performed which showed patent left iliac stents, occluded from the right iliac all the way down into the right SFA, and left superficial femoral artery and popliteal artery occlusions. He was felt to possibly be a candidate for high risk of distal tibial bypass however would only consider  in the setting of limb threatening circumstances. ABIs were felt to be reasonable. Due to prior vein use, prosthetic material would also need to be used which has poorer durability and risks subsequent occlusion and subsequent left AKA as well.  -[facility] has been consulted -Pain management per primary team   3. Hx of DVT/PE/LV thrombus: -Previously noted to be on chronic Eliquis however was recently seen in the OP setting and was noted to have difficulty obtaining his medications and had been off anticoagulation for several days. He was given samples doses of Xarelto '10mg'$  with the plan to restart Eliquis '5mg'$  BID once able to obtain his medications.  -Eliquis '5mg'$  BID restarted this admission  -Heme workup?    For questions or updates, please contact [place] Please consult [email or web address] for contact info under Cardiology/STEMI.   SignedKathyrn Drown NP-C [facility] Pager: [phone] 01/13/2021 11:10 AM   I have examined the patient and reviewed assessment and plan and discussed with patient.  Agree with above as stated.  Atypical chest pain in this patient with extensive CAD.  Biggest complaint is leg pain and he wants some stronger pain meds.    Plan for CTA coronaries to assess for obstructive CAD.  Interestingly, there is no mention of coronary calcification on his CT scan of the chest.     I personally reviewed his echo.  It appears there is a false tendon at the LV apex.  I did not see evidence of thrombus.  The apex contracts well.  I am not sure what the patient means by the clot in his heart that he describes.  He is on Eliquis regardless.  May  need to transition to heparin if other procedures are planned.  Would not start Eliquis until after it is known that he won't need a cardiac cath.    Reports severe claudication as well.  Evaluated by [facility].   ? Whether he has a hypercoagulable state.   Larae Grooms

## 2021-01-13 NOTE — ED Notes (Signed)
Transported to vascular. 

## 2021-01-13 NOTE — ED Notes (Addendum)
Pt AxO x4. Pt still c/o 10/10 pain. Introduced myself to pt. Pt has no CP, but pain in BLE. Pt denies further needs.

## 2021-01-13 NOTE — Progress Notes (Addendum)
Jose Hansen is a 50 y.o. male with severe PVD s/p multiple vascular interventions and right AKA, depression, and insomnia with auditory hallucinations who presents today with chief complaint of chest pain and left leg pain. Hospital day 1.  Subjective:  Patient examined at bedside. Patient complained of having significant pain in both legs. He has right AKA. He stated he has been taking his medications faithfully. He had chest pain on ED visit but that has since resolved with nitroglycerin. He endorsed blurry vision on review of system but stated that corrects with glasses.    Objective:  Vital signs in last 24 hours: Vitals:   01/13/21 0500 01/13/21 0515 01/13/21 0600 01/13/21 0645  BP: (!) 129/96 (!) 138/96 (!) 112/97 129/88  Pulse: 80 80 75 80  Resp: '14 16 14 15  '$ Temp:      TempSrc:      SpO2: 95% 95% 94% 96%  Weight:      Height:       Physical Exam Constitutional:      General: He is in acute distress.     Appearance: He is well-developed. He is not ill-appearing.  HENT:     Head: Normocephalic and atraumatic.  Eyes:     Extraocular Movements: Extraocular movements intact.     Pupils: Pupils are equal, round, and reactive to light.  Cardiovascular:     Rate and Rhythm: Normal rate and regular rhythm.     Pulses:          Radial pulses are 2+ on the right side and 2+ on the left side.       Posterior tibial pulses are detected w/ Doppler on the left side.     Heart sounds: Normal heart sounds.  Pulmonary:     Effort: Pulmonary effort is normal. No tachypnea or respiratory distress.     Breath sounds: Normal breath sounds.  Abdominal:     General: Bowel sounds are normal.     Palpations: Abdomen is soft. There is no mass.     Tenderness: There is no abdominal tenderness.  Musculoskeletal:     Cervical back: Normal range of motion and neck supple.     Right lower leg: No edema.     Left lower leg: No edema.     Comments: Right AKA  Skin:    Findings: Lesion  (scar on left shin) present. No bruising or erythema.     Comments: Left foot cooler compared to rest of body.  Neurological:     General: No focal deficit present.     Mental Status: He is alert and oriented to person, place, and time.  Psychiatric:        Mood and Affect: Mood normal.        Behavior: Behavior normal.   CMP Latest Ref Rng & Units 01/12/2021 01/12/2021 12/03/2020  Glucose 70 - 99 mg/dL 97 109(H) 82  BUN 6 - 20 mg/dL '13 14 11  '$ Creatinine 0.61 - 1.24 mg/dL 1.10 1.13 0.95  Sodium 135 - 145 mmol/L 143 139 145(H)  Potassium 3.5 - 5.1 mmol/L 3.9 3.8 4.3  Chloride 98 - 111 mmol/L 107 107 103  CO2 22 - 32 mmol/L - 24 18(L)  Calcium 8.9 - 10.3 mg/dL - 9.4 9.7  Total Protein 6.5 - 8.1 g/dL - 7.2 -  Total Bilirubin 0.3 - 1.2 mg/dL - 0.8 -  Alkaline Phos 38 - 126 U/L - 76 -  AST 15 - 41 U/L - 26 -  ALT 0 - 44 U/L - 34 -    CBC Latest Ref Rng & Units 01/12/2021 01/12/2021 12/03/2020  WBC 4.0 - 10.5 K/uL - 10.1 10.5  Hemoglobin 13.0 - 17.0 g/dL 13.3 13.7 15.4  Hematocrit 39.0 - 52.0 % 39.0 43.1 47.0  Platelets 150 - 400 K/uL - 266 288    Troponin: negative x2  Assessment/Plan:  Active Problems:   Chest pain  Atypical angina The patient presents here with 1 day history of stabbing chest pain, radiating to his back and to his right arm and not associated with exertion. Has h/o severe PVD s/p multiple vascular interventions. EMS administered 324 ASA, 1 sublingual nitro with resolution of his CP. Patient continues to be HDS. PT/INR 13.3 and 1.0, respectively. CBC wnl. Lipid panel from last month revealed LDL 172, TG 264. CTA and dissection study with runoff was unremarkable for PE, arterial thrombus, or dissection; imaging is overall stable from prior last month. Troponin x2 WNL. EKG with no ischemic changes. TIMI score for UA/STEMI of 2: 8% risk at 14 days of all-cause mortality, new or recurrent MI, or severe recurrent ischemia requiring urgent revascularization. Given h/o cardiac  risk factors (severe PVD and HLD), atypical chest pain warrants further investigation.  Plan: - Cardiology consulted - Telemetry - ASA 81 mg daily - Lipitor 80 mg daily - Tylenol PRN - Zofran PRN   Left leg pain/hx of severe PVD and right AKA The patient endorses left leg pain and swelling which has worsened since yesterday.  He has a history of severe PVD status post multiple vascular interventions and has a right AKA. He is followed by Dr. Oneida Alar from vascular surgery, most recent visit was last month (see above); at that time, he was not deemed a candidate for further intervention.  Only able to detect posterior tibialis pulse on bedside Doppler, though this is normal for the patient. Dissection study with runoff was unremarkable for acute thrombus, stable compared to prior from one month ago.  Left leg pain likely solely 2/2 his severe PVD as further work-up for acute changes such as DVT has been unremarkable. We will consider consulting vascular surgery for any further recommendations and continue with pain management. Dr. Oneida Alar told patient amputation of left leg will happen sooner or later due to high risk intervention as prosthetic material is used.  Plan: - Continue Eliquis 5 mg, twice daily - Continue Gabapentin 300 mg 3 times daily   Depression Patient has depression that is partially controlled with duloxetine. I believe pain is contributing to his depressive mood.   Plan: -Duloxetine 60 mg daily   Insomnia/H/o auditory hallucinations Patient has a history of insomnia with auditory hallucination that are controlled with Seroquel. No active auditory hallucinations.  Plan: - Continue Seroquel 150 mg - Will further work up auditory hallucination to see if it fits with MDD w/ psychotic features vs schizophrenia vs another diagnosis.     DVT prophx: Eliquis Diet: Cardiac Bowel: prn Code:Full  Prior to Admission Living Arrangement: Anticipated Discharge Location:  Home Barriers to Discharge: Cardiac workup and clearance Dispo: Anticipated discharge in approximately 1-2 day(s).   [name], MD 01/13/2021, 7:29 AM Pager: [phone] After 5pm on weekdays and 1pm on weekends:

## 2021-01-13 NOTE — Progress Notes (Signed)
   01/13/21 1903  Vitals  Temp 97.6 F (36.4 C)  Temp Source Oral  BP 138/88  MAP (mmHg) 101  BP Location Left Arm  BP Method Automatic  Patient Position (if appropriate) Sitting  Pulse Rate 84  Pulse Rate Source Monitor  Resp 18  Level of Consciousness  Level of Consciousness Alert  MEWS COLOR  MEWS Score Color Green  Oxygen Therapy  SpO2 99 %  O2 Device Room Air  MEWS Score  MEWS Temp 0  MEWS Systolic 0  MEWS Pulse 0  MEWS RR 0  MEWS LOC 0  MEWS Score 0  Admitted pt to rm 3E15 from ED, pt alert and oriented x 4 c/o 10/10 left leg pain. Oriented to room, call bell placed within reach, placed on cardiac monitor, CCMD made aware.

## 2021-01-13 NOTE — ED Notes (Signed)
I attempted to call report again.

## 2021-01-13 NOTE — Progress Notes (Signed)
Patient arrived from ED via wheelchair. Alert and oriented x 4. Complained of pain in left lower leg of 10/10.  PO dilaudid '1mg'$  administered. No skin issues noted. Able to ambulate with crutches.  Will continue to monitor.

## 2021-01-13 NOTE — Progress Notes (Signed)
Echocardiogram 2D Echocardiogram has been performed.  Jose Hansen 01/13/2021, 1:46 PM

## 2021-01-13 NOTE — Progress Notes (Signed)
  Echocardiogram 2D Echocardiogram has been performed.  Jose Hansen 01/13/2021, 1:39 PM

## 2021-01-13 NOTE — ED Notes (Signed)
Admitting physician at bedside

## 2021-01-14 ENCOUNTER — Encounter (HOSPITAL_COMMUNITY): Payer: Self-pay | Admitting: Internal Medicine

## 2021-01-14 ENCOUNTER — Observation Stay (HOSPITAL_COMMUNITY): Payer: Medicare Other

## 2021-01-14 DIAGNOSIS — I998 Other disorder of circulatory system: Secondary | ICD-10-CM | POA: Diagnosis not present

## 2021-01-14 DIAGNOSIS — Z01810 Encounter for preprocedural cardiovascular examination: Secondary | ICD-10-CM | POA: Diagnosis not present

## 2021-01-14 DIAGNOSIS — G4700 Insomnia, unspecified: Secondary | ICD-10-CM | POA: Diagnosis present

## 2021-01-14 DIAGNOSIS — Z20822 Contact with and (suspected) exposure to covid-19: Secondary | ICD-10-CM | POA: Diagnosis present

## 2021-01-14 DIAGNOSIS — I25118 Atherosclerotic heart disease of native coronary artery with other forms of angina pectoris: Secondary | ICD-10-CM | POA: Diagnosis present

## 2021-01-14 DIAGNOSIS — I70229 Atherosclerosis of native arteries of extremities with rest pain, unspecified extremity: Secondary | ICD-10-CM | POA: Diagnosis not present

## 2021-01-14 DIAGNOSIS — K219 Gastro-esophageal reflux disease without esophagitis: Secondary | ICD-10-CM | POA: Diagnosis present

## 2021-01-14 DIAGNOSIS — R079 Chest pain, unspecified: Secondary | ICD-10-CM | POA: Diagnosis not present

## 2021-01-14 DIAGNOSIS — Z89511 Acquired absence of right leg below knee: Secondary | ICD-10-CM | POA: Diagnosis not present

## 2021-01-14 DIAGNOSIS — E785 Hyperlipidemia, unspecified: Secondary | ICD-10-CM | POA: Diagnosis present

## 2021-01-14 DIAGNOSIS — Z91041 Radiographic dye allergy status: Secondary | ICD-10-CM | POA: Diagnosis not present

## 2021-01-14 DIAGNOSIS — I739 Peripheral vascular disease, unspecified: Secondary | ICD-10-CM | POA: Diagnosis not present

## 2021-01-14 DIAGNOSIS — I70222 Atherosclerosis of native arteries of extremities with rest pain, left leg: Secondary | ICD-10-CM | POA: Diagnosis present

## 2021-01-14 DIAGNOSIS — D72829 Elevated white blood cell count, unspecified: Secondary | ICD-10-CM | POA: Diagnosis not present

## 2021-01-14 DIAGNOSIS — D62 Acute posthemorrhagic anemia: Secondary | ICD-10-CM | POA: Diagnosis not present

## 2021-01-14 DIAGNOSIS — Z993 Dependence on wheelchair: Secondary | ICD-10-CM | POA: Diagnosis not present

## 2021-01-14 DIAGNOSIS — F32A Depression, unspecified: Secondary | ICD-10-CM | POA: Diagnosis present

## 2021-01-14 DIAGNOSIS — Z95828 Presence of other vascular implants and grafts: Secondary | ICD-10-CM | POA: Diagnosis not present

## 2021-01-14 DIAGNOSIS — Z7982 Long term (current) use of aspirin: Secondary | ICD-10-CM | POA: Diagnosis not present

## 2021-01-14 DIAGNOSIS — Z7901 Long term (current) use of anticoagulants: Secondary | ICD-10-CM | POA: Diagnosis not present

## 2021-01-14 DIAGNOSIS — Z86718 Personal history of other venous thrombosis and embolism: Secondary | ICD-10-CM | POA: Diagnosis not present

## 2021-01-14 DIAGNOSIS — R0789 Other chest pain: Secondary | ICD-10-CM | POA: Diagnosis not present

## 2021-01-14 DIAGNOSIS — Z79899 Other long term (current) drug therapy: Secondary | ICD-10-CM | POA: Diagnosis not present

## 2021-01-14 DIAGNOSIS — I70219 Atherosclerosis of native arteries of extremities with intermittent claudication, unspecified extremity: Secondary | ICD-10-CM | POA: Diagnosis not present

## 2021-01-14 DIAGNOSIS — R7303 Prediabetes: Secondary | ICD-10-CM | POA: Diagnosis present

## 2021-01-14 DIAGNOSIS — Z89611 Acquired absence of right leg above knee: Secondary | ICD-10-CM | POA: Diagnosis not present

## 2021-01-14 DIAGNOSIS — I82511 Chronic embolism and thrombosis of right femoral vein: Secondary | ICD-10-CM | POA: Diagnosis present

## 2021-01-14 DIAGNOSIS — Z87891 Personal history of nicotine dependence: Secondary | ICD-10-CM | POA: Diagnosis not present

## 2021-01-14 DIAGNOSIS — Z86711 Personal history of pulmonary embolism: Secondary | ICD-10-CM | POA: Diagnosis not present

## 2021-01-14 DIAGNOSIS — N186 End stage renal disease: Secondary | ICD-10-CM | POA: Diagnosis not present

## 2021-01-14 MED ORDER — OXYCODONE HCL 5 MG PO TABS
5.0000 mg | ORAL_TABLET | ORAL | Status: DC | PRN
Start: 2021-01-14 — End: 2021-01-18
  Administered 2021-01-14 – 2021-01-18 (×5): 5 mg via ORAL
  Filled 2021-01-14 (×5): qty 1

## 2021-01-14 MED ORDER — HYDROMORPHONE HCL 1 MG/ML IJ SOLN
1.0000 mg | INTRAMUSCULAR | Status: DC | PRN
  Administered 2021-01-14 – 2021-01-18 (×21): 1 mg via INTRAVENOUS
  Filled 2021-01-14 (×21): qty 1

## 2021-01-14 MED ORDER — IOHEXOL 350 MG/ML SOLN
95.0000 mL | Freq: Once | INTRAVENOUS | Status: AC | PRN
  Administered 2021-01-14: 95 mL via INTRAVENOUS

## 2021-01-14 MED ORDER — HYDROMORPHONE HCL 2 MG PO TABS
2.0000 mg | ORAL_TABLET | ORAL | Status: DC | PRN
  Administered 2021-01-14: 2 mg via ORAL

## 2021-01-14 MED ORDER — HEPARIN (PORCINE) 25000 UT/250ML-% IV SOLN
2100.0000 [IU]/h | INTRAVENOUS | Status: DC
  Administered 2021-01-14: 1300 [IU]/h via INTRAVENOUS
  Administered 2021-01-15: 1900 [IU]/h via INTRAVENOUS
  Administered 2021-01-16 – 2021-01-17 (×3): 2100 [IU]/h via INTRAVENOUS
  Filled 2021-01-14 (×4): qty 250

## 2021-01-14 MED ORDER — NITROGLYCERIN 0.4 MG SL SUBL
SUBLINGUAL_TABLET | SUBLINGUAL | Status: AC
  Filled 2021-01-14: qty 2

## 2021-01-14 MED ORDER — METOPROLOL TARTRATE 5 MG/5ML IV SOLN
10.0000 mg | Freq: Once | INTRAVENOUS | Status: AC
  Administered 2021-01-14: 10 mg via INTRAVENOUS

## 2021-01-14 MED ORDER — SENNOSIDES-DOCUSATE SODIUM 8.6-50 MG PO TABS
1.0000 | ORAL_TABLET | Freq: Every day | ORAL | Status: DC
  Administered 2021-01-14 – 2021-01-17 (×4): 1 via ORAL
  Filled 2021-01-14 (×4): qty 1

## 2021-01-14 MED ORDER — NITROGLYCERIN 0.4 MG SL SUBL
0.8000 mg | SUBLINGUAL_TABLET | Freq: Once | SUBLINGUAL | Status: AC
  Administered 2021-01-14: 0.8 mg via SUBLINGUAL

## 2021-01-14 MED ORDER — METOPROLOL TARTRATE 5 MG/5ML IV SOLN
INTRAVENOUS | Status: AC
  Filled 2021-01-14: qty 10

## 2021-01-14 MED ORDER — OXYCODONE-ACETAMINOPHEN 5-325 MG PO TABS
1.0000 | ORAL_TABLET | ORAL | Status: DC | PRN
  Administered 2021-01-14 – 2021-01-18 (×6): 1 via ORAL
  Filled 2021-01-14 (×6): qty 1

## 2021-01-14 MED ORDER — ENSURE ENLIVE PO LIQD
237.0000 mL | Freq: Two times a day (BID) | ORAL | Status: DC
  Administered 2021-01-14 – 2021-01-26 (×22): 237 mL via ORAL

## 2021-01-14 NOTE — Progress Notes (Signed)
ANTICOAGULATION CONSULT NOTE - Initial Consult  Pharmacy Consult for heparin  Indication: chest pain/ACS  Allergies  Allergen Reactions   Contrast Media [Iodinated Diagnostic Agents] Nausea Only    NOT AN ALLERGY. ONLY NAUSEA. 12/11/20  Newly reported by [facility] on 12/04/20.    Patient Measurements: Height: '5\' 11"'$  (180.3 cm) Weight: 97.1 kg (214 lb) IBW/kg (Calculated) : 75.3 Heparin Dosing Weight: 94kg  Vital Signs: Temp: 97.8 F (36.6 C) (08/18 0710) Temp Source: Oral (08/18 0710) BP: 146/91 (08/18 0710) Pulse Rate: 75 (08/18 0710)  Labs: Recent Labs    01/12/21 2145 01/12/21 2229 01/12/21 2345  HGB 13.7 13.3  --   HCT 43.1 39.0  --   PLT 266  --   --   LABPROT 13.3  --   --   INR 1.0  --   --   CREATININE 1.13 1.10  --   TROPONINIHS 4  --  4    Estimated Creatinine Clearance: 94.4 mL/min (by C-G formula based on SCr of 1.1 mg/dL).   Medical History: Past Medical History:  Diagnosis Date   H/O blood clots     Medications:  Medications Prior to Admission  Medication Sig Dispense Refill Last Dose   apixaban (ELIQUIS) 5 MG TABS tablet Take 1 tablet (5 mg total) by mouth 2 (two) times daily. 60 tablet 3 01/12/2021 at 0930   aspirin EC 81 MG tablet Take 81 mg by mouth daily. Swallow whole.   01/12/2021   atorvastatin (LIPITOR) 80 MG tablet Take 1 tablet (80 mg total) by mouth daily. 90 tablet 1 01/12/2021   DULoxetine (CYMBALTA) 60 MG capsule Take 1 capsule (60 mg total) by mouth daily. 30 capsule 2 01/12/2021   gabapentin (NEURONTIN) 300 MG capsule Take 1 capsule (300 mg total) by mouth 3 (three) times daily. 90 capsule 3 01/12/2021   QUEtiapine (SEROQUEL) 50 MG tablet Take 3 tablets (150 mg total) by mouth at bedtime. 90 tablet 3 01/11/2021   Scheduled:   aspirin EC  81 mg Oral Daily   atorvastatin  80 mg Oral Daily   DULoxetine  60 mg Oral Daily   gabapentin  300 mg Oral TID   QUEtiapine  150 mg Oral QHS    Assessment 50 yo male with CP. Plans  are for coronary CTA and possible cath. He is noted with history of PVS and multiple interventions including R AKA. 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 Pharmacy consulted to dose heparin. -hg= 12.3  Goal of Therapy:  Heparin level 0.3-0.7 units/ml aPTT 66-102 seconds Monitor platelets by anticoagulation protocol: Yes   Plan:  Start heparin  1300 units/hr at 9:30pm Daily HL, aPTT, CBC  Hildred Laser, PharmD Clinical Pharmacist **Pharmacist phone directory can now be found on [email or web address] ([email or web address]).  Listed under [place].

## 2021-01-14 NOTE — Progress Notes (Addendum)
Jose Hansen is a 50 y.o. male with severe PVD s/p multiple vascular interventions and right AKA, depression, and insomnia with auditory hallucinations who presents today with chief complaint of chest pain and left leg pain. Hospital day 1.  Subjective:  Patient examined at bedside. Patient complained of having significant pain in both legs. He has right AKA. No recurrence of chest pain endorsed. Repeatedly stated pain was really bad and he would be okay if pain was managed.   Objective:  Vital signs in last 24 hours: Vitals:   01/13/21 2340 01/14/21 0147 01/14/21 0210 01/14/21 0710  BP: 130/75  137/75 (!) 146/91  Pulse: 80  68 75  Resp: '18  17 18  '$ Temp: 98.1 F (36.7 C)  97.7 F (36.5 C) 97.8 F (36.6 C)  TempSrc: Oral  Oral Oral  SpO2: 97%  100% 99%  Weight:  97.1 kg    Height:       Physical Exam Constitutional:      General: He is not in acute distress.    Appearance: He is well-developed. He is not ill-appearing.  HENT:     Head: Normocephalic and atraumatic.  Eyes:     Extraocular Movements: Extraocular movements intact.     Pupils: Pupils are equal, round, and reactive to light.  Cardiovascular:     Rate and Rhythm: Normal rate and regular rhythm.     Pulses:          Radial pulses are 2+ on the right side and 2+ on the left side.     Heart sounds: Normal heart sounds.     Comments: Left pedal pulses and posterior tibial pulses no palpable. PT pulses were detected by doppler in the ED.  Pulmonary:     Effort: Pulmonary effort is normal. No tachypnea or respiratory distress.     Breath sounds: Normal breath sounds.  Abdominal:     General: Bowel sounds are normal.     Palpations: Abdomen is soft. There is no mass.     Tenderness: There is no abdominal tenderness.  Musculoskeletal:     Cervical back: Normal range of motion and neck supple.     Right lower leg: No edema.     Left lower leg: No edema.     Comments: Right AKA  Skin:    Findings: Lesion (scar  on left shin) present. No bruising or erythema.     Comments: Left foot cooler compared to rest of body.  Neurological:     General: No focal deficit present.     Mental Status: He is alert and oriented to person, place, and time.  Psychiatric:        Mood and Affect: Mood normal.        Behavior: Behavior normal.   CMP Latest Ref Rng & Units 01/12/2021 01/12/2021 12/03/2020  Glucose 70 - 99 mg/dL 97 109(H) 82  BUN 6 - 20 mg/dL '13 14 11  '$ Creatinine 0.61 - 1.24 mg/dL 1.10 1.13 0.95  Sodium 135 - 145 mmol/L 143 139 145(H)  Potassium 3.5 - 5.1 mmol/L 3.9 3.8 4.3  Chloride 98 - 111 mmol/L 107 107 103  CO2 22 - 32 mmol/L - 24 18(L)  Calcium 8.9 - 10.3 mg/dL - 9.4 9.7  Total Protein 6.5 - 8.1 g/dL - 7.2 -  Total Bilirubin 0.3 - 1.2 mg/dL - 0.8 -  Alkaline Phos 38 - 126 U/L - 76 -  AST 15 - 41 U/L - 26 -  ALT  0 - 44 U/L - 34 -    CBC Latest Ref Rng & Units 01/12/2021 01/12/2021 12/03/2020  WBC 4.0 - 10.5 K/uL - 10.1 10.5  Hemoglobin 13.0 - 17.0 g/dL 13.3 13.7 15.4  Hematocrit 39.0 - 52.0 % 39.0 43.1 47.0  Platelets 150 - 400 K/uL - 266 288    Troponin: negative x2  Assessment/Plan:  Principal Problem:   Atypical chest pain Active Problems:   PVD (peripheral vascular disease) (HCC)   Hyperlipidemia   Hx of AKA (above knee amputation), right (HCC)  Atypical chest pain The patient presents here with 1 day history of stabbing chest pain, radiating to his back and to his right arm and not associated with exertion. Has h/o severe PVD s/p multiple vascular interventions. EMS administered 324 ASA, 1 sublingual nitro with resolution of his CP. Patient continues to be HDS. PT/INR 13.3 and 1.0, respectively. CBC wnl. Lipid panel from last month revealed LDL 172, TG 264. CTA and dissection study with runoff was unremarkable for PE, arterial thrombus, or dissection; imaging is overall stable from prior last month. Troponin x2 WNL. EKG with no ischemic changes. TIMI score for UA/STEMI of 2: 8% risk at  14 days of all-cause mortality, new or recurrent MI, or severe recurrent ischemia requiring urgent revascularization. Given h/o cardiac risk factors (severe PVD and HLD), atypical chest pain warrants further investigation.  Plan: - Cardiology consulted  -Cardiac CTA pending - Telemetry - ASA 81 mg daily - Lipitor 80 mg daily - Tylenol PRN - Zofran PRN   Left leg pain/hx of severe PVD and right AKA The patient endorses left leg pain and swelling which has worsened since yesterday.  He has a history of severe PVD status post multiple vascular interventions and has a right AKA. He is followed by Dr. Oneida Alar from vascular surgery, most recent visit was last month (see above); at that time, he was not deemed a candidate for further intervention.  Only able to detect posterior tibialis pulse on bedside Doppler, though this is normal for the patient. Dissection study with runoff was unremarkable for acute thrombus, stable compared to prior from one month ago.  Left leg pain likely solely 2/2 his severe PVD as further work-up for acute changes such as DVT has been unremarkable. We will consider consulting vascular surgery for any further recommendations and continue with pain management. Dr. Oneida Alar told patient amputation of left leg will happen sooner or later due to high risk intervention as prosthetic material is used. Vascular planning angiogram on Monday.   Plan: -Vascular surgery consulted, recs appreciated - D/C eliquis in preperation for potential vascular intervenion, start Heparin drip per pharm - Continue Gabapentin 300 mg 3 times daily -1 mg IV dilaudid for severe pain -10 mg percocet (5 percocet + 5 oxycodone) q4hrs prn   Depression Patient has depression that is partially controlled with duloxetine. I believe pain is contributing to his depressive mood. Patient feeling more depressed due to leg pain and possibility of amputation. Spoke with patient at bedside after being notified. Patient  expressed interest in talking with chaplin.   Plan: -Duloxetine 60 mg daily -Chaplain consult -Will refer to psych at discharge for outpatient establishment   ?Schizophrenia vs Schizoaffective Disorder vs MDD w/ psychotic features? Patient has a history of insomnia with auditory hallucination that are controlled with Seroquel. No active auditory hallucinations. Patient states he was diagnosed with schizophrenia in his 52s but didn't take the proper medications. He stated he hears voices constantly that  are not his and has delusions, along with mood swings with depression. He has not seen a psychiatrist here so will likely need to establish with one outpatient. He endorses the Seroquel takes care of his symptoms.  Plan: -Continue Seroquel 150 mg -Will refer to psych at discharge for outpatient establishment     DVT prophx: Heparin drip Diet: Cardiac Bowel: Senna S Code:Full  Prior to Admission Living Arrangement: Anticipated Discharge Location: Home Barriers to Discharge: Cardiac and vascular workup Dispo: Anticipated discharge in approximately 1-2 day(s).   [name], MD 01/14/2021, 8:54 AM Pager: [phone] After 5pm on weekdays and 1pm on weekends:

## 2021-01-14 NOTE — Consult Note (Addendum)
Hospital Consult    Reason for Consult:  left leg pain Requesting Physician:  Dr. Chancy Milroy MRN #:  [id number]  History of Present Illness: This is a 50 y.o. male who was admitted days ago with complaints of chest pain.  He also complained of bilateral lower extremity pain and has extensive history of peripheral arterial disease. He is interviewed and examined on 3E where he sleeping soundly, but awakens easily. He describes chronic LLE pain and now states he cannot walk further than approximately 10 feet without having to stop. The pain awakens him at night, but dependency does not help his pain. He denies skin breakdown. He is complaint with aspirin, statin and Plavix. He does not use tobacco. His chest pain has improved.  The patient was recently evaluated by Dr. Oneida Alar as an outpatient on December 03, 2020. 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  This included left greater saphenous vein harvest for vein bypass.   He ambulates with crutches.  He was describing claudication at approximately one half block relieved with rest.  He denied rest pain in his left foot.  ABIs at that time were performed:  0.6 on the left with dampened monophasic waveforms.  CT angiogram of the abdomen pelvis and left lower extremity runoff was arranged and the patient followed up on July 21st, 2022.  The CTA revealed patent left iliac stents with occlusion from the right iliac artery continuing into the right superficial femoral artery.  The left superficial femoral artery and popliteal artery are occluded.  There is reconstitution of the distal posterior tibial and peroneal artery on the left leg.  These findings were explained and the patient was counseled by Dr. Oneida Alar that he would only consider a distal tibial bypass for a limb threatening situation.  The bypass graft would be of prosthetic material since his vein has been previously  harvested.  He described short-lived durability of the bypass and he would be at high risk for left above-knee amputation if the bypass occluded.  The patient has a history of unprovoked DVT/PE and questionable LV thrombus.  Updated echocardiogram did not reveal evidence of LV thrombus.  No history of renal disease.  The pt is on a statin for cholesterol management.  The pt is on a daily aspirin.   Other AC:  apixaban, clopidogrel The pt not on medication for hypertension.   The pt is not diabetic.   Tobacco hx: Former  Past Medical History:  Diagnosis Date   H/O blood clots     Past Surgical History:  Procedure Laterality Date   BELOW KNEE LEG AMPUTATION Right 2018    Allergies  Allergen Reactions   Contrast Media [Iodinated Diagnostic Agents] Nausea Only    NOT AN ALLERGY. ONLY NAUSEA. 12/11/20  Newly reported by [facility] on 12/04/20.    Prior to Admission medications   Medication Sig Start Date End Date Taking? Authorizing Provider  apixaban (ELIQUIS) 5 MG TABS tablet Take 1 tablet (5 mg total) by mouth 2 (two) times daily. 01/08/21  Yes Masters, Katie, DO  aspirin EC 81 MG tablet Take 81 mg by mouth daily. Swallow whole.   Yes [provider]  atorvastatin (LIPITOR) 80 MG tablet Take 1 tablet (80 mg total) by mouth daily. 01/08/21 04/08/21 Yes Masters, Katie, DO  DULoxetine (CYMBALTA) 60 MG capsule Take 1 capsule (60 mg total) by mouth daily. 01/08/21  Yes Masters, Joellen Jersey, DO  gabapentin (NEURONTIN) 300 MG capsule Take 1 capsule (300 mg total) by mouth 3 (three) times daily. 01/04/21  Yes Masters, Katie, DO  QUEtiapine (SEROQUEL) 50 MG tablet Take 3 tablets (150 mg total) by mouth at bedtime. 01/08/21  Yes Masters, Scientist, research (physical sciences), DO    Social History   Socioeconomic History   Marital status: Single    Spouse name: Not on file   Number of children: Not on file   Years of education: Not on file   Highest education level: Not on file  Occupational History   Not on  file  Tobacco Use   Smoking status: Former    Types: Cigarettes   Smokeless tobacco: Never  Vaping Use   Vaping Use: Never used  Substance and Sexual Activity   Alcohol use: Not Currently   Drug use: Not Currently   Sexual activity: Yes  Other Topics Concern   Not on file  Social History Narrative   Not on file   Social Determinants of Health   Financial Resource Strain: Not on file  Food Insecurity: Not on file  Transportation Needs: Not on file  Physical Activity: Not on file  Stress: Not on file  Social Connections: Not on file  Intimate Partner Violence: Not on file     Family History  Problem Relation Age of Onset   CAD Neg Hx    Hypertension Neg Hx     ROS: '[x]'$  Positive   '[ ]'$  Negative   '[ ]'$  All sytems reviewed and are negative  Cardiac: '[]'$  chest pain/pressure '[]'$  palpitations '[]'$  SOB lying flat '[]'$  DOE  Vascular: '[x]'$  pain in legs while walking '[x]'$  pain in legs at rest '[x]'$  pain in legs at night '[]'$  non-healing ulcers '[x]'$  hx of DVT '[]'$  swelling in legs  Pulmonary: '[]'$  productive cough '[]'$  asthma/wheezing '[]'$  home O2  Neurologic: '[]'$  weakness in '[]'$  arms '[]'$  legs '[]'$  numbness in '[]'$  arms '[]'$  legs '[]'$  hx of CVA '[]'$  mini stroke '[]'$ difficulty speaking or slurred speech '[]'$  temporary loss of vision in one eye '[]'$  dizziness  Hematologic: '[]'$  hx of cancer '[]'$  bleeding problems '[]'$  problems with blood clotting easily  Endocrine:   '[]'$  diabetes '[]'$  thyroid disease  GI '[]'$  vomiting blood '[]'$  blood in stool  GU: '[]'$  CKD/renal failure '[]'$  HD--'[]'$  M/W/F or '[]'$  T/T/S '[]'$  burning with urination '[]'$  blood in urine  Psychiatric: '[]'$  anxiety '[]'$  depression  Musculoskeletal: '[]'$  arthritis '[]'$  joint pain  Integumentary: '[]'$  rashes '[]'$  ulcers  Constitutional: '[]'$  fever '[]'$  chills   Physical Examination  Vitals:   01/14/21 0210 01/14/21 0710  BP: 137/75 (!) 146/91  Pulse: 68 75  Resp: 17 18  Temp: 97.7 F (36.5 C) 97.8 F (36.6 C)  SpO2: 100% 99%   Body mass index is  29.85 kg/m.  General:  WDWN in NAD Gait: Not observed HENT: WNL, normocephalic Pulmonary: normal non-labored breathing Cardiac: regular,  Abdomen:  soft, NT/ND, Skin: without rashes Vascular Exam/Pulses: 2-3+ left femoral pulse. Right femoral pulse not palpable. 2+ bilateral radial pulses. Left pedal pulses not palpable and not able to detect with hand-held Doppler. Extremities: with possible early ischemic changes to left toes, without Gangrene , without cellulitis; without open wounds; right AKA well healed Musculoskeletal: no muscle wasting or atrophy  Neurologic: A&O X 3;  No focal weakness or paresthesias are detected; speech is fluent/normal Psychiatric:  The pt has Normal affect.   CBC    Component Value Date/Time   WBC 10.1 01/12/2021 2145  RBC 5.06 01/12/2021 2145   HGB 13.3 01/12/2021 2229   HGB 15.4 12/03/2020 1202   HCT 39.0 01/12/2021 2229   HCT 47.0 12/03/2020 1202   PLT 266 01/12/2021 2145   PLT 288 12/03/2020 1202   MCV 85.2 01/12/2021 2145   MCV 81 12/03/2020 1202   MCH 27.1 01/12/2021 2145   MCHC 31.8 01/12/2021 2145   RDW 13.2 01/12/2021 2145   RDW 13.5 12/03/2020 1202   LYMPHSABS 3.5 01/12/2021 2145   LYMPHSABS 3.0 12/03/2020 1202   MONOABS 0.8 01/12/2021 2145   EOSABS 0.3 01/12/2021 2145   EOSABS 0.4 12/03/2020 1202   BASOSABS 0.1 01/12/2021 2145   BASOSABS 0.1 12/03/2020 1202    BMET    Component Value Date/Time   NA 143 01/12/2021 2229   NA 145 (H) 12/03/2020 1202   K 3.9 01/12/2021 2229   CL 107 01/12/2021 2229   CO2 24 01/12/2021 2145   GLUCOSE 97 01/12/2021 2229   BUN 13 01/12/2021 2229   BUN 11 12/03/2020 1202   CREATININE 1.10 01/12/2021 2229   CALCIUM 9.4 01/12/2021 2145   GFRNONAA >60 01/12/2021 2145    COAGS: Lab Results  Component Value Date   INR 1.0 01/12/2021   INR 1.0 11/24/2020     Non-Invasive Vascular Imaging:  12/03/2020 ABI Findings:  +--------+------------------+-----+--------+--------+  Right   Rt  Pressure (mmHg)IndexWaveformComment   +--------+------------------+-----+--------+--------+  Brachial150                                      +--------+------------------+-----+--------+--------+  PTA                                    AKA       +--------+------------------+-----+--------+--------+  DP                                     AKA       +--------+------------------+-----+--------+--------+   +---------+------------------+-----+-------------------+-------+  Left     Lt Pressure (mmHg)IndexWaveform           Comment  +---------+------------------+-----+-------------------+-------+  Brachial 150                                                +---------+------------------+-----+-------------------+-------+  PTA      95                0.63 dampened monophasic         +---------+------------------+-----+-------------------+-------+  DP       90                0.60 dampened monophasic         +---------+------------------+-----+-------------------+-------+  Great Toe44                0.29                             +---------+------------------+-----+-------------------+-------+   +-------+-----------+-----------+------------+------------+  ABI/TBIToday's ABIToday's TBIPrevious ABIPrevious TBI  +-------+-----------+-----------+------------+------------+  Right  AKA        AKA                                  +-------+-----------+-----------+------------+------------+  Left   0.63       0.29                                 +-------+-----------+-----------+------------+------------+    Summary:  Right: AKA.  Left: Resting left ankle-brachial index indicates moderate left lower  extremity arterial disease; however, index and waveform due not correlate  and idnex may be overestimated.  The left toe-brachial index is abnormal.     *See table(s) above for measurements and observations.     Electronically signed  by Ruta Hinds MD on 12/03/2020 at 2:03:48 PM.    Final     ASSESSMENT/PLAN: This is a 50 y.o. 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 Experiencing chronic LLE pain, worsening. Likely some element of rest pain. Update ABIs.  Dr. Donzetta Matters reviewed vascular evaluation as before. Tentatively plan arteriogram on Monday. Await further cardiac work-up.  Admitted with chest pain and is to undergo coronary CTA today.  History of DVT/PE maintained on Eliquis.  Risa Grill, PA-C [facility] [phone]    I have interviewed and examined patient with PA and agree with assessment and plan above.  Patient with very difficult arterial insufficiency issues of the left lower extremity.  Has significant scarring from previous vein harvest on the left.  He has a aortic and left common iliac stents which appear patent and he has a common femoral pulse on the left.  The SFA is diminutive and then frankly occludes appears to have single-vessel runoff via the posterior tibial artery.  Given his young age bypass with prosthetic likely to have low patency long-term.  We will begin with angiography likely from a left brachial approach.  We will consider endovascular intervention for revascularization.  All of this is pending his current cardiac work-up.  We will need to hold Eliquis until procedure tentatively scheduled for Monday p.m.  Lucyle Alumbaugh C. Donzetta Matters, MD [facility] [place] Office: [phone] Pager: [phone]

## 2021-01-14 NOTE — Progress Notes (Signed)
Progress Note  Patient Name: JOSAI HOVERSON Date of Encounter: 01/14/2021  [facility] Cardiologist: None Dr. Irish Lack  Subjective   No chest pain. Main issue is  L leg pain.   Inpatient Medications    Scheduled Meds:  aspirin EC  81 mg Oral Daily   atorvastatin  80 mg Oral Daily   DULoxetine  60 mg Oral Daily   gabapentin  300 mg Oral TID   QUEtiapine  150 mg Oral QHS   Continuous Infusions:  PRN Meds: acetaminophen **OR** acetaminophen, HYDROmorphone, ondansetron **OR** ondansetron (ZOFRAN) IV, senna-docusate   Vital Signs    Vitals:   01/13/21 2340 01/14/21 0147 01/14/21 0210 01/14/21 0710  BP: 130/75  137/75 (!) 146/91  Pulse: 80  68 75  Resp: '18  17 18  '$ Temp: 98.1 F (36.7 C)  97.7 F (36.5 C) 97.8 F (36.6 C)  TempSrc: Oral  Oral Oral  SpO2: 97%  100% 99%  Weight:  97.1 kg    Height:        Intake/Output Summary (Last 24 hours) at 01/14/2021 0902 Last data filed at 01/14/2021 0715 Gross per 24 hour  Intake 480 ml  Output 950 ml  Net -470 ml   Last 3 Weights 01/14/2021 01/12/2021 01/04/2021  Weight (lbs) 214 lb 209 lb 209 lb  Weight (kg) 97.07 kg 94.802 kg 94.802 kg      Telemetry    SR - Personally Reviewed  ECG    N/A  Physical Exam   GEN: No acute distress.   Neck: No JVD Cardiac: RRR, no murmurs, rubs, or gallops.  Respiratory: Clear to auscultation bilaterally. GI: Soft, nontender, non-distended  MS: s/p right AKA Neuro:  Nonfocal  Psych: Normal affect   Labs    High Sensitivity Troponin:   Recent Labs  Lab 01/12/21 2145 01/12/21 2345  TROPONINIHS 4 4      Chemistry Recent Labs  Lab 01/12/21 2145 01/12/21 2229  NA 139 143  K 3.8 3.9  CL 107 107  CO2 24  --   GLUCOSE 109* 97  BUN 14 13  CREATININE 1.13 1.10  CALCIUM 9.4  --   PROT 7.2  --   ALBUMIN 4.2  --   AST 26  --   ALT 34  --   ALKPHOS 76  --   BILITOT 0.8  --   GFRNONAA >60  --   ANIONGAP 8  --      Hematology Recent Labs  Lab 01/12/21 2145  01/12/21 2229  WBC 10.1  --   RBC 5.06  --   HGB 13.7 13.3  HCT 43.1 39.0  MCV 85.2  --   MCH 27.1  --   MCHC 31.8  --   RDW 13.2  --   PLT 266  --     BNPNo results for input(s): BNP, PROBNP in the last 168 hours.   DDimer No results for input(s): DDIMER in the last 168 hours.   Radiology    CT Angio Aortobifemoral W and/or Wo Contrast  Result Date: 01/12/2021 CLINICAL DATA:  History of occluded right iliac system and previous right above the knee amputation with leg pain and back pain, initial encounter EXAM: CT ANGIOGRAPHY OF ABDOMINAL AORTA WITH ILIOFEMORAL RUNOFF TECHNIQUE: Multidetector CT imaging of the abdomen, pelvis and lower extremities was performed using the standard protocol during bolus administration of intravenous contrast. Multiplanar CT image reconstructions and MIPs were obtained to evaluate the vascular anatomy. CONTRAST:  161m OMNIPAQUE  IOHEXOL 350 MG/ML SOLN COMPARISON:  None. FINDINGS: VASCULAR Aorta: No significant atherosclerotic calcifications are noted. Mild eccentric atheromatous plaque is noted without focal stenosis. Celiac: Patent without evidence of aneurysm, dissection, vasculitis or significant stenosis. SMA: Patent without evidence of aneurysm, dissection, vasculitis or significant stenosis. Renals: Both renal arteries are patent without evidence of aneurysm, dissection, vasculitis, fibromuscular dysplasia or significant stenosis. IMA: Patent without evidence of aneurysm, dissection, vasculitis or significant stenosis. RIGHT Lower Extremity Inflow: Long segment occlusion of the common iliac, internal iliac and external iliac arteries on the right are seen. Runoff: Common femoral and superficial femoral artery are occluded. Profundus femoral branches are noted via collateralization. The overall appearance is similar to that seen on the prior exam. LEFT Lower Extremity Inflow: Extensive arterial stents are identified throughout the common iliac artery on the  left extending to the level of the iliac bifurcation. The external and internal iliac arteries are widely patent without focal hemodynamically significant stenosis. Atherosclerotic calcifications are noted in the common femoral artery. Runoff: Superficial femoral artery is significantly small with occlusion in the mid thighs similar to that seen on the prior exam. Profundus femoral branches are noted. The superficial femoral artery occlusion extends into the mid popliteal artery where there is reconstitution just above the knee joint again similar to that seen on the prior exam. The popliteal artery however occludes just below the knee joint with some collateral flow into the musculature reconstitution of the posterior tibial artery is identified which continues into the foot similar to that seen on the prior exam. Diminutive anterior tibial and peroneal arteries are identified with segmental areas of reconstitution. No significant visualization of the distal anterior tibial artery is noted. Veins: No specific venous abnormality is noted. Review of the MIP images confirms the above findings. NON-VASCULAR Nonvascular structures of lower extremities demonstrate evidence of right above the knee amputation. The musculature in left lower extremity appears within normal limits. No significant soft tissue abnormality or bony abnormality is noted. The remainder of the non vascular structures will be covered on the concurrent CTA of the chest abdomen and pelvis. IMPRESSION: VASCULAR Stable long segment occlusion involving the right iliac arterial system and extending into the common femoral and superficial femoral artery. Patent left common iliac artery stent. Long segment left superficial femoral artery occlusion and proximal popliteal occlusion. The more distal popliteal artery is occluded following reconstitution with multiple muscular collaterals reconstituting single-vessel runoff via the posterior tibial artery to the  ankle. The overall appearance of these findings is stable from the prior exam. NON-VASCULAR Nonvascular structures of the lower extremities are within normal limits with the exception of changes of prior above knee amputation on the right. Electronically Signed   By: Inez Catalina M.D.   On: 01/12/2021 23:59   DG Chest Portable 1 View  Result Date: 01/12/2021 CLINICAL DATA:  Chest pain and shortness of breath EXAM: PORTABLE CHEST 1 VIEW COMPARISON:  11/24/2020 FINDINGS: Cardiac shadow is stable. The lungs are hypoinflated but clear. No bony abnormality is noted. IMPRESSION: No active disease. Electronically Signed   By: Inez Catalina M.D.   On: 01/12/2021 22:01   ECHOCARDIOGRAM COMPLETE  Result Date: 01/13/2021    ECHOCARDIOGRAM REPORT   Patient Name:   ROWE SILKWORTH Date of Exam: 01/13/2021 Medical Rec #:  [id number]         Height:       71.0 in Accession #:    [id number]        Weight:  209.0 lb Date of Birth:  21-Jan-1971          BSA:          2.148 m Patient Age:    50 years          BP:           126/78 mmHg Patient Gender: M                 HR:           83 bpm. Exam Location:  Inpatient Procedure: 2D Echo, Cardiac Doppler, Color Doppler and Intracardiac            Opacification Agent Indications:    Chest pain  History:        Patient has no prior history of Echocardiogram examinations.                 PVD, Signs/Symptoms:DVT, Right AKA; Risk Factors:Former Smoker.  Sonographer:    Dustin Flock RDCS Referring Phys: [place]  1. Mild inferior and inferoseptal hypokinesis. Left ventricular ejection fraction, by estimation, is 55 to 60%. The left ventricle has normal function. The left ventricle has no regional wall motion abnormalities. Left ventricular diastolic parameters are consistent with Grade I diastolic dysfunction (impaired relaxation).  2. Right ventricular systolic function is normal. The right ventricular size is normal. There is normal pulmonary artery  systolic pressure.  3. The mitral valve is normal in structure. No evidence of mitral valve regurgitation. No evidence of mitral stenosis.  4. The aortic valve is tricuspid. Aortic valve regurgitation is not visualized. No aortic stenosis is present.  5. The inferior vena cava is normal in size with greater than 50% respiratory variability, suggesting right atrial pressure of 3 mmHg. FINDINGS  Left Ventricle: Mild inferior and inferoseptal hypokinesis. Left ventricular ejection fraction, by estimation, is 55 to 60%. The left ventricle has normal function. The left ventricle has no regional wall motion abnormalities. Definity contrast agent was given IV to delineate the left ventricular endocardial borders. The left ventricular internal cavity size was normal in size. There is no left ventricular hypertrophy. Left ventricular diastolic parameters are consistent with Grade I diastolic dysfunction (impaired relaxation). Right Ventricle: The right ventricular size is normal. No increase in right ventricular wall thickness. Right ventricular systolic function is normal. There is normal pulmonary artery systolic pressure. The tricuspid regurgitant velocity is 2.50 m/s, and  with an assumed right atrial pressure of 3 mmHg, the estimated right ventricular systolic pressure is [id number] mmHg. Left Atrium: Left atrial size was normal in size. Right Atrium: Right atrial size was normal in size. Pericardium: There is no evidence of pericardial effusion. Mitral Valve: The mitral valve is normal in structure. No evidence of mitral valve regurgitation. No evidence of mitral valve stenosis. Tricuspid Valve: The tricuspid valve is normal in structure. Tricuspid valve regurgitation is trivial. No evidence of tricuspid stenosis. Aortic Valve: The aortic valve is tricuspid. Aortic valve regurgitation is not visualized. No aortic stenosis is present. Pulmonic Valve: The pulmonic valve was normal in structure. Pulmonic valve regurgitation is  not visualized. No evidence of pulmonic stenosis. Aorta: The aortic root is normal in size and structure. Venous: The inferior vena cava is normal in size with greater than 50% respiratory variability, suggesting right atrial pressure of 3 mmHg. IAS/Shunts: No atrial level shunt detected by color flow Doppler.  LEFT VENTRICLE PLAX 2D LVIDd:         4.50 cm  Diastology LVIDs:         3.00 cm     LV e' medial:    4.13 cm/s LV PW:         1.00 cm     LV E/e' medial:  12.8 LV IVS:        0.90 cm     LV e' lateral:   9.03 cm/s LVOT diam:     2.30 cm     LV E/e' lateral: 5.8 LV SV:         61 LV SV Index:   28 LVOT Area:     4.15 cm  LV Volumes (MOD) LV vol d, MOD A4C: 81.4 ml LV vol s, MOD A4C: 36.7 ml LV SV MOD A4C:     81.4 ml RIGHT VENTRICLE RV Basal diam:  2.40 cm RV S prime:     11.50 cm/s TAPSE (M-mode): 1.4 cm LEFT ATRIUM             Index       RIGHT ATRIUM           Index LA diam:        3.70 cm 1.72 cm/m  RA Area:     10.50 cm LA Vol (A2C):   24.3 ml 11.31 ml/m RA Volume:   20.30 ml  9.45 ml/m LA Vol (A4C):   23.2 ml 10.80 ml/m LA Biplane Vol: 23.9 ml 11.12 ml/m  AORTIC VALVE LVOT Vmax:   86.20 cm/s LVOT Vmean:  58.100 cm/s LVOT VTI:    0.146 m  AORTA Ao Root diam: 3.40 cm MITRAL VALVE               TRICUSPID VALVE MV Area (PHT): 4.60 cm    TR Peak grad:   25.0 mmHg MV Decel Time: 165 msec    TR Vmax:        250.00 cm/s MV E velocity: 52.70 cm/s MV A velocity: 62.10 cm/s  SHUNTS MV E/A ratio:  0.85        Systemic VTI:  0.15 m                            Systemic Diam: 2.30 cm Skeet Latch MD Electronically signed by Skeet Latch MD Signature Date/Time: 01/13/2021/4:21:42 PM    Final    CT Angio Chest/Abd/Pel for Dissection W and/or Wo Contrast  Result Date: 01/13/2021 CLINICAL DATA:  Chest pain, evaluate for dissection. EXAM: CT ANGIOGRAPHY CHEST, ABDOMEN AND PELVIS TECHNIQUE: Non-contrast CT of the chest was initially obtained. Multidetector CT imaging through the chest, abdomen and  pelvis was performed using the standard protocol during bolus administration of intravenous contrast. Multiplanar reconstructed images and MIPs were obtained and reviewed to evaluate the vascular anatomy. CONTRAST:  138m OMNIPAQUE IOHEXOL 350 MG/ML SOLN COMPARISON:  Concurrent aortobifemoral runoff. Prior aortobifemoral runoff dated 12/11/2020. FINDINGS: CTA CHEST FINDINGS Cardiovascular: On unenhanced CT, there is no evidence of intramural hematoma. Preferential opacification of the thoracic aorta. No evidence of thoracic aortic aneurysm or dissection. Calcified thrombus within the right main pulmonary artery unenhanced CT (series 4/image 26), chronic. Although not tailored for evaluation of the pulmonary arteries, there is no evidence of acute pulmonary embolism to the lobar level. The heart is normal in size.  No pericardial effusion. Mediastinum/Nodes: No suspicious mediastinal lymphadenopathy. Visualized thyroid is unremarkable. Lungs/Pleura: Mild dependent atelectasis in the posterior upper and lower lobes. No suspicious pulmonary nodules. No focal consolidation. Trace  right pleural fluid. No pneumothorax. Musculoskeletal: Visualized osseous structures are within normal limits. Review of the MIP images confirms the above findings. CTA ABDOMEN AND PELVIS FINDINGS VASCULAR Aorta: No evidence of abdominal aortic aneurysm or dissection. Stent in the lower aorta just above the bifurcation extending to the left common iliac artery. Celiac: Patent. SMA: Patent. Renals: Patent bilaterally. IMA: Patent. Inflow: Long segment occlusion starting with the right common iliac artery, described on dedicated runoff. Veins: Unremarkable. Review of the MIP images confirms the above findings. NON-VASCULAR Hepatobiliary: Liver is within normal limits. Gallbladder is unremarkable. No intrahepatic or extrahepatic ductal dilatation. Pancreas: Within normal limits. Spleen: Heterogeneous arterial enhancement, within normal limits.  Adrenals/Urinary Tract: Adrenal glands are within normal limits. Kidneys are within normal limits.  No hydronephrosis. Bladder is within normal limits. Stomach/Bowel: Stomach is within normal limits. No evidence of bowel obstruction. Normal appendix (series 7/image 243). No colonic wall thickening or inflammatory changes. Lymphatic: No suspicious abdominopelvic lymphadenopathy. Reproductive: Prostate is unremarkable. Other: No abdominopelvic ascites. Postsurgical changes in the left inguinal region. Musculoskeletal: Sclerotic lesions in the bilateral iliac bones, including a 15 mm lesion on the right (series 7/image 246), unchanged. Visualized osseous structures are otherwise within normal limits. Review of the MIP images confirms the above findings. IMPRESSION: No evidence of thoracoabdominal aortic aneurysm or dissection. 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 No evidence of acute pulmonary embolism. Refer to dedicated abdominal runoff report for additional pertinent vascular findings. Electronically Signed   By: Julian Hy M.D.   On: 01/13/2021 00:15    Cardiac Studies   Echo 01/13/2021 IMPRESSIONS    1. Mild inferior and inferoseptal hypokinesis. Left ventricular ejection  fraction, by estimation, is 55 to 60%. The left ventricle has normal  function. The left ventricle has no regional wall motion abnormalities.  Left ventricular diastolic parameters  are consistent with Grade I diastolic dysfunction (impaired relaxation).   2. Right ventricular systolic function is normal. The right ventricular  size is normal. There is normal pulmonary artery systolic pressure.   3. The mitral valve is normal in structure. No evidence of mitral valve  regurgitation. No evidence of mitral stenosis.   4. The aortic valve is tricuspid. Aortic valve regurgitation is not  visualized. No aortic stenosis is present.   5. The inferior vena cava is normal in size with greater than 50%   respiratory variability, suggesting right atrial pressure of 3 mmHg.   Patient Profile     50 y.o. male  hx of severe PAD s/p multiple PV interventions with right AKA in [place] now with left leg claudication symptoms who is followed by Dr. 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 01/04/21) due to compliance and cost, depression, and HLD seen for chest pain.   Assessment & Plan    1.Chest pain - Troponin negative. EKG with acute changes. No recurrent chest pain since admit.  - For Coronary CTA today   2. Hx of DVT/PE/LV Thrombus - Echo this admission without evidence of LV thrombus - ? Hypercoagulable state  - Unfortunate he got Eliquis this morning around 8 AM>> now on hold   3. PAD/Severe left claudication with pain - Pending vascular evaluation - Per primary team    For questions or updates, please contact [place] Please consult [email or web address] for contact info under        SignedLeanor Kail, PA  01/14/2021, 9:02 AM

## 2021-01-14 NOTE — Progress Notes (Signed)
IMPRESSION: 1. Coronary artery calcium score 0 Agatston units. This suggests low risk for future cardiac events.   2.  No significant coronary disease noted.    No further cardiac work up. Follow up with PCP.

## 2021-01-15 DIAGNOSIS — R0789 Other chest pain: Secondary | ICD-10-CM | POA: Diagnosis not present

## 2021-01-15 LAB — CBC
HCT: 40.7 % (ref 39.0–52.0)
Hemoglobin: 13 g/dL (ref 13.0–17.0)
MCH: 27.2 pg (ref 26.0–34.0)
MCHC: 31.9 g/dL (ref 30.0–36.0)
MCV: 85.1 fL (ref 80.0–100.0)
Platelets: 251 10*3/uL (ref 150–400)
RBC: 4.78 MIL/uL (ref 4.22–5.81)
RDW: 12.9 % (ref 11.5–15.5)
WBC: 8.2 10*3/uL (ref 4.0–10.5)
nRBC: 0 % (ref 0.0–0.2)

## 2021-01-15 LAB — APTT
aPTT: 39 seconds — ABNORMAL HIGH (ref 24–36)
aPTT: 40 seconds — ABNORMAL HIGH (ref 24–36)
aPTT: 63 seconds — ABNORMAL HIGH (ref 24–36)

## 2021-01-15 LAB — HEPARIN LEVEL (UNFRACTIONATED): Heparin Unfractionated: 0.95 IU/mL — ABNORMAL HIGH (ref 0.30–0.70)

## 2021-01-15 NOTE — Progress Notes (Signed)
Jose Hansen is a 50 y.o. male with severe PVD s/p multiple vascular interventions and right AKA, depression, and insomnia with auditory hallucinations who presents today with chief complaint of chest pain and left leg pain. Hospital day 2.  Subjective:  Patient examined at bedside. Patient complained of having significant pain in both legs. He has right AKA. No recurrence of chest pain endorsed. Patient stated pain was well controlled with IV Dilaudid and he is doing much better. Patient having normal bowel and bladder function.   Objective:  Vital signs in last 24 hours: Vitals:   01/14/21 1606 01/14/21 1930 01/15/21 0356 01/15/21 0848  BP: 119/71 (!) 140/102 128/75 125/90  Pulse: 64 74 72 78  Resp: '19 19 16 17  '$ Temp: 97.9 F (36.6 C) 97.7 F (36.5 C) 97.8 F (36.6 C)   TempSrc: Oral Oral Oral   SpO2: 99% 98% 95% 96%  Weight:   96.9 kg   Height:       Physical Exam Constitutional:      General: He is not in acute distress.    Appearance: He is well-developed. He is not ill-appearing.  HENT:     Head: Normocephalic and atraumatic.  Eyes:     Extraocular Movements: Extraocular movements intact.     Pupils: Pupils are equal, round, and reactive to light.  Cardiovascular:     Rate and Rhythm: Normal rate and regular rhythm.     Pulses:          Radial pulses are 2+ on the right side and 2+ on the left side.     Heart sounds: Normal heart sounds.     Comments: Left pedal pulses and posterior tibial pulses no palpable. PT pulses were detected by doppler in the ED.  Pulmonary:     Effort: Pulmonary effort is normal. No tachypnea or respiratory distress.     Breath sounds: Normal breath sounds.  Abdominal:     General: Bowel sounds are normal.     Palpations: Abdomen is soft. There is no mass.     Tenderness: There is no abdominal tenderness.  Musculoskeletal:     Cervical back: Normal range of motion and neck supple.     Right lower leg: No edema.     Left lower leg:  No edema.     Comments: Right AKA  Skin:    Findings: Lesion (scar on left shin) present. No bruising or erythema.     Comments: Left foot cooler compared to rest of body.  Neurological:     General: No focal deficit present.     Mental Status: He is alert and oriented to person, place, and time.  Psychiatric:        Mood and Affect: Mood normal.        Behavior: Behavior normal.   CMP Latest Ref Rng & Units 01/12/2021 01/12/2021 12/03/2020  Glucose 70 - 99 mg/dL 97 109(H) 82  BUN 6 - 20 mg/dL '13 14 11  '$ Creatinine 0.61 - 1.24 mg/dL 1.10 1.13 0.95  Sodium 135 - 145 mmol/L 143 139 145(H)  Potassium 3.5 - 5.1 mmol/L 3.9 3.8 4.3  Chloride 98 - 111 mmol/L 107 107 103  CO2 22 - 32 mmol/L - 24 18(L)  Calcium 8.9 - 10.3 mg/dL - 9.4 9.7  Total Protein 6.5 - 8.1 g/dL - 7.2 -  Total Bilirubin 0.3 - 1.2 mg/dL - 0.8 -  Alkaline Phos 38 - 126 U/L - 76 -  AST 15 -  41 U/L - 26 -  ALT 0 - 44 U/L - 34 -    CBC Latest Ref Rng & Units 01/15/2021 01/12/2021 01/12/2021  WBC 4.0 - 10.5 K/uL 8.2 - 10.1  Hemoglobin 13.0 - 17.0 g/dL 13.0 13.3 13.7  Hematocrit 39.0 - 52.0 % 40.7 39.0 43.1  Platelets 150 - 400 K/uL 251 - 266    Troponin: negative x2  Assessment/Plan:  Principal Problem:   Atypical chest pain Active Problems:   PVD (peripheral vascular disease) (HCC)   Hyperlipidemia   Hx of AKA (above knee amputation), right (HCC)   Ischemia of extremity  Atypical chest pain The patient presents here with 1 day history of stabbing chest pain, radiating to his back and to his right arm and not associated with exertion. Has h/o severe PVD s/p multiple vascular interventions. EMS administered 324 ASA, 1 sublingual nitro with resolution of his CP. Patient continues to be HDS. PT/INR 13.3 and 1.0, respectively. CBC wnl. Lipid panel from last month revealed LDL 172, TG 264. CTA and dissection study with runoff was unremarkable for PE, arterial thrombus, or dissection; imaging is overall stable from prior last  month. Troponin x2 WNL. EKG with no ischemic changes. TIMI score for UA/STEMI of 2: 8% risk at 14 days of all-cause mortality, new or recurrent MI, or severe recurrent ischemia requiring urgent revascularization. Given h/o cardiac risk factors (severe PVD and HLD), atypical chest pain warrants further investigation.  Plan: - Cardiology consulted  -Cardiac CTA negative for CAD. - Telemetry discontinued - ASA 81 mg daily - Lipitor 80 mg daily - Tylenol PRN - Zofran PRN   Left leg pain/hx of severe PVD and right AKA The patient endorses left leg pain and swelling which has worsened since yesterday.  He has a history of severe PVD status post multiple vascular interventions and has a right AKA. He is followed by Dr. Oneida Alar from vascular surgery, most recent visit was last month (see above); at that time, he was not deemed a candidate for further intervention.  Only able to detect posterior tibialis pulse on bedside Doppler, though this is normal for the patient. Dissection study with runoff was unremarkable for acute thrombus, stable compared to prior from one month ago.  Left leg pain likely solely 2/2 his severe PVD as further work-up for acute changes such as DVT has been unremarkable. We will consider consulting vascular surgery for any further recommendations and continue with pain management. Dr. Oneida Alar told patient amputation of left leg will happen sooner or later due to high risk intervention as prosthetic material is used. Vascular planning angiogram on Monday.   Plan: -Vascular surgery consulted, recs appreciated - D/C eliquis in preperation for potential vascular intervenion, start Heparin drip per pharm - Continue Gabapentin 300 mg 3 times daily -1 mg IV dilaudid for severe pain -10 mg percocet (5 percocet + 5 oxycodone) q4hrs prn   Depression Patient has depression that is partially controlled with duloxetine. I believe pain is contributing to his depressive mood. Patient feeling more  depressed due to leg pain and possibility of amputation. Spoke with patient at bedside after being notified. Patient expressed interest in talking with chaplin.   Plan: -Duloxetine 60 mg daily -Chaplain consult pending -Will refer to psych at discharge for outpatient establishment   ?Schizophrenia vs Schizoaffective Disorder vs MDD w/ psychotic features? Patient has a history of insomnia with auditory hallucination that are controlled with Seroquel. No active auditory hallucinations. Patient states he was diagnosed with schizophrenia  in his 43s but didn't take the proper medications. He stated he hears voices constantly that are not his and has delusions, along with mood swings with depression. He has not seen a psychiatrist here so will likely need to establish with one outpatient. He endorses the Seroquel takes care of his symptoms.  Plan: -Continue Seroquel 150 mg -Will refer to psych at discharge for outpatient establishment     DVT prophx: Heparin drip Diet: Cardiac Bowel: Senna S Code:Full  Prior to Admission Living Arrangement: Anticipated Discharge Location: Home Barriers to Discharge: Cardiac and vascular workup Dispo: Anticipated discharge in approximately 1-2 day(s).   [name], MD 01/15/2021, 1:00 PM Pager: [phone] After 5pm on weekdays and 1pm on weekends:

## 2021-01-15 NOTE — Progress Notes (Signed)
This chaplain responded to DO consult for spiritual care.  The chaplain understands the Pt. is requesting companionship and spiritual care as he faces Monday's procedure and the possibility of amputation.  The Pt. gave the chaplain permission to chart and request assistance from a social worker.  The chaplain through reflective listening understands the Pt. is a new resident to [facility] and sleeping on an air mattress in his brother's home. The Pt. is requesting assistance with housing resources and positive community relationships as he faces the possibility of becoming a double amputee.   The chaplain listens as the Pt. chooses to share his successes with positive community support through a previous rehab admission for addiction and depression.  The Pt. witnesses to his faith in God and requests prayer with F/U visits.

## 2021-01-15 NOTE — Progress Notes (Signed)
ANTICOAGULATION CONSULT NOTE  Pharmacy Consult for heparin  Indication: chest pain/ACS  Allergies  Allergen Reactions   Contrast Media [Iodinated Diagnostic Agents] Nausea Only    NOT AN ALLERGY. ONLY NAUSEA. 12/11/20  Newly reported by [facility] on 12/04/20.    Patient Measurements: Height: '5\' 11"'$  (180.3 cm) Weight: 96.9 kg (213 lb 11.2 oz) IBW/kg (Calculated) : 75.3 Heparin Dosing Weight: 94kg  Vital Signs: Temp: 97.8 F (36.6 C) (08/19 0356) Temp Source: Oral (08/19 0356) BP: 128/75 (08/19 0356) Pulse Rate: 72 (08/19 0356)  Labs: Recent Labs    01/12/21 2145 01/12/21 2229 01/12/21 2345 01/15/21 0347  HGB 13.7 13.3  --  13.0  HCT 43.1 39.0  --  40.7  PLT 266  --   --  251  APTT  --   --   --  39*  LABPROT 13.3  --   --   --   INR 1.0  --   --   --   HEPARINUNFRC  --   --   --  0.95*  CREATININE 1.13 1.10  --   --   TROPONINIHS 4  --  4  --      Estimated Creatinine Clearance: 94.3 mL/min (by C-G formula based on SCr of 1.1 mg/dL).   Medical History: Past Medical History:  Diagnosis Date   H/O blood clots     Assessment 51 yo male with CP. Plans are for coronary CTA and possible cath. He is noted with history of PVS and multiple interventions including R AKA. 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 Pharmacy consulted to dose heparin.  Heparin level 0.95 (affected by apixaban), aPTT 39 sec (subtherapeutic) on gtt at 1300 units/hr. No issues with line or bleeding reported per RN.  Goal of Therapy:  Heparin level 0.3-0.7 units/ml aPTT 66-102 seconds Monitor platelets by anticoagulation protocol: Yes   Plan:  Increase heparin to 1600 units/hr F/u 6 hr aPTT  Sherlon Handing, PharmD, BCPS Please see amion for complete clinical pharmacist phone list 01/15/2021 4:55 AM

## 2021-01-15 NOTE — Progress Notes (Signed)
ANTICOAGULATION CONSULT NOTE  Pharmacy Consult for heparin  Indication: chest pain/ACS  Allergies  Allergen Reactions   Contrast Media [Iodinated Diagnostic Agents] Nausea Only    NOT AN ALLERGY. ONLY NAUSEA. 12/11/20  Newly reported by [facility] on 12/04/20.    Patient Measurements: Height: '5\' 11"'$  (180.3 cm) Weight: 96.9 kg (213 lb 11.2 oz) IBW/kg (Calculated) : 75.3 Heparin Dosing Weight: 94kg  Vital Signs: Temp: 97.9 F (36.6 C) (08/19 1922) Temp Source: Oral (08/19 1922) BP: 112/85 (08/19 1922) Pulse Rate: 81 (08/19 1922)  Labs: Recent Labs    01/12/21 2145 01/12/21 2229 01/12/21 2345 01/15/21 0347 01/15/21 1049 01/15/21 1927  HGB 13.7 13.3  --  13.0  --   --   HCT 43.1 39.0  --  40.7  --   --   PLT 266  --   --  251  --   --   APTT  --   --   --  39* 40* 63*  LABPROT 13.3  --   --   --   --   --   INR 1.0  --   --   --   --   --   HEPARINUNFRC  --   --   --  0.95*  --   --   CREATININE 1.13 1.10  --   --   --   --   TROPONINIHS 4  --  4  --   --   --      Estimated Creatinine Clearance: 94.3 mL/min (by C-G formula based on SCr of 1.1 mg/dL).   Medical History: Past Medical History:  Diagnosis Date   H/O blood clots     Assessment 50 yo male with CP and cardiac CTA with no significant CAD. He is noted with history of PVD and multiple interventions including R AKA. 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 Pharmacy consulted to dose heparin. Plans noted for angiography on Monday.   Heparin level still affected by apixaban, aPTT 63 sec (subtherapeutic) on gtt at 1900 units/hr. No bleeding or problems with infusion per RN.  Goal of Therapy:  Heparin level 0.3-0.7 units/ml aPTT 66-102 seconds Monitor platelets by anticoagulation protocol: Yes   Plan:  Increase heparin drip to 2100 units/hr F/u aPTT in am Daily heparin level, aPTT, CBC daily Monitor for s/sx of bleeding  Thank you for involving pharmacy  in this patient's care.  Renold Genta, PharmD, BCPS Clinical Pharmacist Clinical phone for 01/15/2021 until 10p is [phone] 01/15/2021 8:28 PM  **Pharmacist phone directory can be found on [email or web address] listed under [place]**

## 2021-01-15 NOTE — Progress Notes (Signed)
ANTICOAGULATION CONSULT NOTE  Pharmacy Consult for heparin  Indication: chest pain/ACS  Allergies  Allergen Reactions   Contrast Media [Iodinated Diagnostic Agents] Nausea Only    NOT AN ALLERGY. ONLY NAUSEA. 12/11/20  Newly reported by [facility] on 12/04/20.    Patient Measurements: Height: '5\' 11"'$  (180.3 cm) Weight: 96.9 kg (213 lb 11.2 oz) IBW/kg (Calculated) : 75.3 Heparin Dosing Weight: 94kg  Vital Signs: Temp: 97.8 F (36.6 C) (08/19 0356) Temp Source: Oral (08/19 0356) BP: 125/90 (08/19 0848) Pulse Rate: 78 (08/19 0848)  Labs: Recent Labs    01/12/21 2145 01/12/21 2229 01/12/21 2345 01/15/21 0347 01/15/21 1049  HGB 13.7 13.3  --  13.0  --   HCT 43.1 39.0  --  40.7  --   PLT 266  --   --  251  --   APTT  --   --   --  39* 40*  LABPROT 13.3  --   --   --   --   INR 1.0  --   --   --   --   HEPARINUNFRC  --   --   --  0.95*  --   CREATININE 1.13 1.10  --   --   --   TROPONINIHS 4  --  4  --   --      Estimated Creatinine Clearance: 94.3 mL/min (by C-G formula based on SCr of 1.1 mg/dL).   Medical History: Past Medical History:  Diagnosis Date   H/O blood clots     Assessment 50 yo male with CP and cardiac CTA with no significant CAD. He is noted with history of PVD and multiple interventions including R AKA. 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 Pharmacy consulted to dose heparin. Plans noted for angiography on Monday.   Heparin level 0.95 (affected by apixaban), aPTT 40 sec (subtherapeutic) on gtt at 1600 units/hr  Goal of Therapy:  Heparin level 0.3-0.7 units/ml aPTT 66-102 seconds Monitor platelets by anticoagulation protocol: Yes   Plan:  Increase heparin to 1900 units/hr F/u 6 hr aPTT  Hildred Laser, PharmD Clinical Pharmacist **Pharmacist phone directory can now be found on [email or web address] ([email or web address]).  Listed under [place].

## 2021-01-15 NOTE — Plan of Care (Signed)
  Problem: Education: Goal: Knowledge of General Education information will improve Description Including pain rating scale, medication(s)/side effects and non-pharmacologic comfort measures Outcome: Progressing   Problem: Health Behavior/Discharge Planning: Goal: Ability to manage health-related needs will improve Outcome: Progressing   

## 2021-01-15 NOTE — Plan of Care (Signed)
  Problem: Health Behavior/Discharge Planning: Goal: Ability to manage health-related needs will improve Outcome: Progressing   Problem: Clinical Measurements: Goal: Ability to maintain clinical measurements within normal limits will improve Outcome: Progressing Goal: Will remain free from infection Outcome: Progressing Goal: Respiratory complications will improve Outcome: Progressing   

## 2021-01-15 NOTE — H&P (View-Only) (Signed)
  Progress Note    01/15/2021 8:23 AM * No surgery date entered *  Subjective: Still having left leg pain, no chest pain  Vitals:   01/14/21 1930 01/15/21 0356  BP: (!) 140/102 128/75  Pulse: 74 72  Resp: 19 16  Temp: 97.7 F (36.5 C) 97.8 F (36.6 C)  SpO2: 98% 95%    Physical Exam: Awake alert oriented Nonlabored respirations Stable dark discoloration left foot  CBC    Component Value Date/Time   WBC 8.2 01/15/2021 0347   RBC 4.78 01/15/2021 0347   HGB 13.0 01/15/2021 0347   HGB 15.4 12/03/2020 1202   HCT 40.7 01/15/2021 0347   HCT 47.0 12/03/2020 1202   PLT 251 01/15/2021 0347   PLT 288 12/03/2020 1202   MCV 85.1 01/15/2021 0347   MCV 81 12/03/2020 1202   MCH 27.2 01/15/2021 0347   MCHC 31.9 01/15/2021 0347   RDW 12.9 01/15/2021 0347   RDW 13.5 12/03/2020 1202   LYMPHSABS 3.5 01/12/2021 2145   LYMPHSABS 3.0 12/03/2020 1202   MONOABS 0.8 01/12/2021 2145   EOSABS 0.3 01/12/2021 2145   EOSABS 0.4 12/03/2020 1202   BASOSABS 0.1 01/12/2021 2145   BASOSABS 0.1 12/03/2020 1202    BMET    Component Value Date/Time   NA 143 01/12/2021 2229   NA 145 (H) 12/03/2020 1202   K 3.9 01/12/2021 2229   CL 107 01/12/2021 2229   CO2 24 01/12/2021 2145   GLUCOSE 97 01/12/2021 2229   BUN 13 01/12/2021 2229   BUN 11 12/03/2020 1202   CREATININE 1.10 01/12/2021 2229   CALCIUM 9.4 01/12/2021 2145   GFRNONAA >60 01/12/2021 2145    INR    Component Value Date/Time   INR 1.0 01/12/2021 2145     Intake/Output Summary (Last 24 hours) at 01/15/2021 0823 Last data filed at 01/15/2021 [id number] Gross per 24 hour  Intake 1446.91 ml  Output 0 ml  Net 1446.91 ml     Assessment/plan:  50 y.o. male is here with left lower extremity pain and dark discoloration.  Plan is to repeat ABIs and angiography from left brachial possible tibial access on Monday.  Continue to hold Eliquis.    Shawntia Mangal C. Donzetta Matters, MD [facility] [place] Office:  [phone] Pager: [phone]  01/15/2021 8:23 AM

## 2021-01-15 NOTE — Progress Notes (Signed)
  Progress Note    01/15/2021 8:23 AM * No surgery date entered *  Subjective: Still having left leg pain, no chest pain  Vitals:   01/14/21 1930 01/15/21 0356  BP: (!) 140/102 128/75  Pulse: 74 72  Resp: 19 16  Temp: 97.7 F (36.5 C) 97.8 F (36.6 C)  SpO2: 98% 95%    Physical Exam: Awake alert oriented Nonlabored respirations Stable dark discoloration left foot  CBC    Component Value Date/Time   WBC 8.2 01/15/2021 0347   RBC 4.78 01/15/2021 0347   HGB 13.0 01/15/2021 0347   HGB 15.4 12/03/2020 1202   HCT 40.7 01/15/2021 0347   HCT 47.0 12/03/2020 1202   PLT 251 01/15/2021 0347   PLT 288 12/03/2020 1202   MCV 85.1 01/15/2021 0347   MCV 81 12/03/2020 1202   MCH 27.2 01/15/2021 0347   MCHC 31.9 01/15/2021 0347   RDW 12.9 01/15/2021 0347   RDW 13.5 12/03/2020 1202   LYMPHSABS 3.5 01/12/2021 2145   LYMPHSABS 3.0 12/03/2020 1202   MONOABS 0.8 01/12/2021 2145   EOSABS 0.3 01/12/2021 2145   EOSABS 0.4 12/03/2020 1202   BASOSABS 0.1 01/12/2021 2145   BASOSABS 0.1 12/03/2020 1202    BMET    Component Value Date/Time   NA 143 01/12/2021 2229   NA 145 (H) 12/03/2020 1202   K 3.9 01/12/2021 2229   CL 107 01/12/2021 2229   CO2 24 01/12/2021 2145   GLUCOSE 97 01/12/2021 2229   BUN 13 01/12/2021 2229   BUN 11 12/03/2020 1202   CREATININE 1.10 01/12/2021 2229   CALCIUM 9.4 01/12/2021 2145   GFRNONAA >60 01/12/2021 2145    INR    Component Value Date/Time   INR 1.0 01/12/2021 2145     Intake/Output Summary (Last 24 hours) at 01/15/2021 0823 Last data filed at 01/15/2021 [id number] Gross per 24 hour  Intake 1446.91 ml  Output 0 ml  Net 1446.91 ml     Assessment/plan:  50 y.o. male is here with left lower extremity pain and dark discoloration.  Plan is to repeat ABIs and angiography from left brachial possible tibial access on Monday.  Continue to hold Eliquis.    Kamila Broda C. Donzetta Matters, MD [facility] [place] Office:  [phone] Pager: [phone]  01/15/2021 8:23 AM

## 2021-01-16 ENCOUNTER — Inpatient Hospital Stay (HOSPITAL_COMMUNITY): Payer: Medicare Other

## 2021-01-16 DIAGNOSIS — R0789 Other chest pain: Secondary | ICD-10-CM | POA: Diagnosis not present

## 2021-01-16 DIAGNOSIS — I998 Other disorder of circulatory system: Secondary | ICD-10-CM

## 2021-01-16 DIAGNOSIS — I70229 Atherosclerosis of native arteries of extremities with rest pain, unspecified extremity: Secondary | ICD-10-CM

## 2021-01-16 DIAGNOSIS — I70219 Atherosclerosis of native arteries of extremities with intermittent claudication, unspecified extremity: Secondary | ICD-10-CM

## 2021-01-16 DIAGNOSIS — I739 Peripheral vascular disease, unspecified: Secondary | ICD-10-CM

## 2021-01-16 LAB — APTT
aPTT: 70 seconds — ABNORMAL HIGH (ref 24–36)
aPTT: 80 seconds — ABNORMAL HIGH (ref 24–36)

## 2021-01-16 LAB — CBC
HCT: 39.7 % (ref 39.0–52.0)
Hemoglobin: 12.8 g/dL — ABNORMAL LOW (ref 13.0–17.0)
MCH: 26.9 pg (ref 26.0–34.0)
MCHC: 32.2 g/dL (ref 30.0–36.0)
MCV: 83.6 fL (ref 80.0–100.0)
Platelets: 243 10*3/uL (ref 150–400)
RBC: 4.75 MIL/uL (ref 4.22–5.81)
RDW: 13 % (ref 11.5–15.5)
WBC: 8.8 10*3/uL (ref 4.0–10.5)
nRBC: 0 % (ref 0.0–0.2)

## 2021-01-16 LAB — HEPARIN LEVEL (UNFRACTIONATED): Heparin Unfractionated: 0.64 IU/mL (ref 0.30–0.70)

## 2021-01-16 MED ORDER — PANTOPRAZOLE SODIUM 40 MG PO TBEC
40.0000 mg | DELAYED_RELEASE_TABLET | Freq: Every day | ORAL | Status: DC
  Administered 2021-01-16 – 2021-01-17 (×2): 40 mg via ORAL
  Filled 2021-01-16 (×2): qty 1

## 2021-01-16 NOTE — Progress Notes (Signed)
VASCULAR LAB    ABIs have been performed.  See CV proc for preliminary results.   Yasmene Salomone, RVT 01/16/2021, 10:24 AM

## 2021-01-16 NOTE — Plan of Care (Signed)
  Problem: Health Behavior/Discharge Planning: Goal: Ability to manage health-related needs will improve Outcome: Progressing   Problem: Clinical Measurements: Goal: Ability to maintain clinical measurements within normal limits will improve Outcome: Progressing   Problem: Pain Managment: Goal: General experience of comfort will improve Outcome: Progressing

## 2021-01-16 NOTE — Plan of Care (Signed)

## 2021-01-16 NOTE — Progress Notes (Signed)
  Summary: Jose Hansen is a 50 y.o. male with severe PVD s/p multiple vascular interventions, right AKA, left femoral and popliteal artery occlusions, and recent right femoral DVT who was admitted 01/12/21 for further vascular workup of left lower extremity ischemia.  Subjective:  Patient examined at bedside. Patient looked comfortable but when asked did endorse having significant pain in both legs. He has right AKA. No recurrence of chest pain endorsed. Patient stated pain was well controlled with mixture of IV Dilaudid and oral pain medicines. He endorsed doing much better. Patient having normal bowel and bladder function.   Objective:  Vital signs in last 24 hours: Vitals:   01/15/21 1922 01/16/21 0418 01/16/21 0500 01/16/21 0758  BP: 112/85 (!) 92/59  130/77  Pulse: 81 77  78  Resp: 19 18    Temp: 97.9 F (36.6 C) 97.9 F (36.6 C)    TempSrc: Oral Oral    SpO2: 94% 98%    Weight:   98.8 kg   Height:       General: well appearing in no acute distress Cardiac: RRR. LLE cool distal>proximal. No lower extremity edema MSK right AKA  Assessment/Plan:  Principal Problem:   Atypical chest pain Active Problems:   PVD (peripheral vascular disease) (HCC)   Hyperlipidemia   Hx of AKA (above knee amputation), right (HCC)   Ischemia of extremity  Critical limb ischemia of the left lower extremity secondary to PAD Management per [facility] Angiogram on Monday Pain management: prn oxycodone, prn dilaudid Statin therapy: Lipitor '80mg'$  daily Antiplatelet therapy: Asprin 81 mg  GERD Start protonix '40mg'$  daily  Chronic depression Continue symbalta and seroquel  Right common femoral DVT Continue heparin gtt  Chest pain--resolved Coronary Calcium score low, no further workup at this time     Resolved Ddx Atypical chest pain (Resolved) The patient presents here with 1 day history of stabbing chest pain, radiating to his back and to his right arm and not associated with exertion.  Has h/o severe PVD s/p multiple vascular interventions. EMS administered 324 ASA, 1 sublingual nitro with resolution of his CP. Patient continues to be HDS. PT/INR 13.3 and 1.0, respectively. CBC wnl. Lipid panel from last month revealed LDL 172, TG 264. CTA and dissection study with runoff was unremarkable for PE, arterial thrombus, or dissection; imaging is overall stable from prior last month. Troponin x2 WNL. EKG with no ischemic changes. Cardiac CTA negative for CAD.   Plan: - ASA 81 mg daily - Lipitor 80 mg daily - Tylenol PRN - Zofran PRN  DVT prophx: Heparin drip Diet: Cardiac Bowel: Senna S Code:Full  Prior to Admission Living Arrangement: Anticipated Discharge Location: Home Barriers to Discharge: Cardiac and vascular workup Dispo: Anticipated discharge in approximately 1-2 day(s).   [name], MD 01/16/2021, 9:36 AM Pager: [phone] After 5pm on weekdays and 1pm on weekends:

## 2021-01-16 NOTE — Progress Notes (Signed)
ANTICOAGULATION CONSULT NOTE - Follow Up Consult  Pharmacy Consult for heparin Indication: chest pain/ACS  Allergies  Allergen Reactions   Contrast Media [Iodinated Diagnostic Agents] Nausea Only    NOT AN ALLERGY. ONLY NAUSEA. 12/11/20  Newly reported by [facility] on 12/04/20.    Patient Measurements: Height: '5\' 11"'$  (180.3 cm) Weight: 98.8 kg (217 lb 14.4 oz) IBW/kg (Calculated) : 75.3 Heparin Dosing Weight: 94 kg  Vital Signs: Temp: 98.5 F (36.9 C) (08/20 1228) Temp Source: Oral (08/20 1228) BP: 122/84 (08/20 1228) Pulse Rate: 86 (08/20 1228)  Labs: Recent Labs    01/15/21 0347 01/15/21 1049 01/15/21 1927 01/16/21 0259 01/16/21 1144  HGB 13.0  --   --  12.8*  --   HCT 40.7  --   --  39.7  --   PLT 251  --   --  243  --   APTT 39*   < > 63* 70* 80*  HEPARINUNFRC 0.95*  --   --  0.64  --    < > = values in this interval not displayed.    Estimated Creatinine Clearance: 95.2 mL/min (by C-G formula based on SCr of 1.1 mg/dL).   Assessment: 50 yo male with CP and cardiac CTA with no significant CAD. He is noted with history of PVD and multiple interventions including R AKA. 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 Pharmacy consulted to dose heparin, which was initiated at 1300 units/hr and increased to 2100 units/hr due to multiple subtherapeutic levels. Plans noted for angiography on Monday.   Heparin level this morning is 0.64. aPTT is 80 - within goal. CBC is stable. No bleeding or problems with infusion noted per chart review.  Goal of Therapy:  Heparin level 0.3-0.7 units/ml aPTT 66-102 seconds Monitor platelets by anticoagulation protocol: Yes   Plan:  Continue heparin 2100 units/hr Daily heparin level, aPTT, CBC  Thank you for including pharmacy in the care of this patient.  Zenaida Deed, PharmD PGY1 Acute Care Pharmacy Resident  Phone: [phone] 01/16/2021  3:23 PM  Please check [email or web address] for  unit-specific pharmacy phone numbers.

## 2021-01-16 NOTE — Progress Notes (Signed)
ANTICOAGULATION CONSULT NOTE  Pharmacy Consult for heparin  Indication: chest pain/ACS  Allergies  Allergen Reactions   Contrast Media [Iodinated Diagnostic Agents] Nausea Only    NOT AN ALLERGY. ONLY NAUSEA. 12/11/20  Newly reported by [facility] on 12/04/20.    Patient Measurements: Height: '5\' 11"'$  (180.3 cm) Weight: 96.9 kg (213 lb 11.2 oz) IBW/kg (Calculated) : 75.3 Heparin Dosing Weight: 94kg  Vital Signs: Temp: 97.9 F (36.6 C) (08/19 1922) Temp Source: Oral (08/19 1922) BP: 112/85 (08/19 1922) Pulse Rate: 81 (08/19 1922)  Labs: Recent Labs    01/15/21 0347 01/15/21 1049 01/15/21 1927 01/16/21 0259  HGB 13.0  --   --  12.8*  HCT 40.7  --   --  39.7  PLT 251  --   --  243  APTT 39* 40* 63* 70*  HEPARINUNFRC 0.95*  --   --  0.64     Estimated Creatinine Clearance: 94.3 mL/min (by C-G formula based on SCr of 1.1 mg/dL).   Medical History: Past Medical History:  Diagnosis Date   H/O blood clots     Assessment 50 yo male with CP and cardiac CTA with no significant CAD. He is noted with history of PVD and multiple interventions including R AKA. 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 Pharmacy consulted to dose heparin. Plans noted for angiography on Monday.   Heparin level still affected by apixaban, aPTT 70 sec (therapeutic) on gtt at 2100 units/hr. No bleeding noted.  Goal of Therapy:  Heparin level 0.3-0.7 units/ml aPTT 66-102 seconds Monitor platelets by anticoagulation protocol: Yes   Plan:  Continue heparin drip at 2100 units/hr F/u 6 hr aPTT to confirm therapeutic  Sherlon Handing, PharmD, BCPS Please see amion for complete clinical pharmacist phone list 01/16/2021 4:01 AM

## 2021-01-17 LAB — CBC
HCT: 39.6 % (ref 39.0–52.0)
Hemoglobin: 12.5 g/dL — ABNORMAL LOW (ref 13.0–17.0)
MCH: 26.7 pg (ref 26.0–34.0)
MCHC: 31.6 g/dL (ref 30.0–36.0)
MCV: 84.4 fL (ref 80.0–100.0)
Platelets: 237 10*3/uL (ref 150–400)
RBC: 4.69 MIL/uL (ref 4.22–5.81)
RDW: 13.1 % (ref 11.5–15.5)
WBC: 8.7 10*3/uL (ref 4.0–10.5)
nRBC: 0 % (ref 0.0–0.2)

## 2021-01-17 LAB — HEPARIN LEVEL (UNFRACTIONATED): Heparin Unfractionated: <0.1 IU/mL — ABNORMAL LOW (ref 0.30–0.70)

## 2021-01-17 LAB — APTT: aPTT: 42 seconds — ABNORMAL HIGH (ref 24–36)

## 2021-01-17 LAB — BASIC METABOLIC PANEL
Anion gap: 7 (ref 5–15)
BUN: 10 mg/dL (ref 6–20)
CO2: 25 mmol/L (ref 22–32)
Calcium: 9.1 mg/dL (ref 8.9–10.3)
Chloride: 103 mmol/L (ref 98–111)
Creatinine, Ser: 0.98 mg/dL (ref 0.61–1.24)
GFR, Estimated: >60 mL/min (ref >60)
Glucose, Bld: 264 mg/dL — ABNORMAL HIGH (ref 70–99)
Potassium: 4.2 mmol/L (ref 3.5–5.1)
Sodium: 135 mmol/L (ref 135–145)

## 2021-01-17 MED ORDER — ENOXAPARIN SODIUM 100 MG/ML IJ SOSY
1.0000 mg/kg | PREFILLED_SYRINGE | Freq: Once | INTRAMUSCULAR | Status: AC
  Administered 2021-01-17: 100 mg via SUBCUTANEOUS
  Filled 2021-01-17: qty 1

## 2021-01-17 NOTE — Progress Notes (Signed)
  Summary: Jose Hansen is a 50 y.o. male with severe PVD s/p multiple vascular interventions, right AKA, left femoral and popliteal artery occlusions, and recent right femoral DVT who was admitted 01/12/21 for further vascular workup of left lower extremity ischemia. No significant changes in medical management today. Angiogram tomorrow will help further guide hospital course.  Interm history:   LLE ABI yesterday showed slight decrease 0.63>0.49.  Having some issues with his IV's coming out overnight. IV out again this morning.  No significant change in LLE pain.  Objective:  Vital signs in last 24 hours: Vitals:   01/16/21 0758 01/16/21 1228 01/16/21 1918 01/17/21 0442  BP: 130/77 122/84 (!) 133/55 132/81  Pulse: 78 86 90 88  Resp:  '16 18 19  '$ Temp:  98.5 F (36.9 C) 98.3 F (36.8 C) 98.5 F (36.9 C)  TempSrc:  Oral Oral Oral  SpO2:  96% 95% 98%  Weight:    98.9 kg  Height:       General: well appearing, in no acute distress Cardiac: RRR, LLE cool to touch--unchanged from yesterday   Assessment/Plan:  Principal Problem:   Atypical chest pain Active Problems:   PVD (peripheral vascular disease) (HCC)   Hyperlipidemia   Prediabetes   Hx of AKA (above knee amputation), right (HCC)   Ischemia of extremity  Critical limb ischemia of the left lower extremity secondary to PAD Management per [facility]VS Angiogram on Monday NPO after midnight Will let vascular surgery decide on their preferences for when/if to hold heparin pre-operatively Pain management: prn oxycodone, prn dilaudid Statin therapy: Lipitor '80mg'$  daily Antiplatelet therapy: Asprin 81 mg  GERD protonix '40mg'$  daily  Chronic depression Continue symbalta and seroquel  Right common femoral DVT Continue heparin gtt. Will let vascular surgery guide whether this needs to be held pre-operatively  Chest pain--resolved Coronary Calcium score low, no further workup at this time   DVT prophx: Heparin  drip Code:Full  Prior to Admission Living Arrangement: Anticipated Discharge Location: Home Barriers to Discharge: Cardiac and vascular workup Dispo: Anticipated discharge in approximately 1-2 day(s).   Mitzi Hansen, MD Internal Medicine Resident PGY-3 [facility] Pager: [phone] 01/17/2021 7:23 AM

## 2021-01-17 NOTE — Progress Notes (Signed)
Patient accidentally removed two of his PIV's during the shift. He denied remembering what happened. He believed someone pulled out his IV.   Md Darrick Meigs was made aware of both times his IV was removed. She was also made aware he was without his heparin infusion for approximately 2 hours.  Patient was thoroughly educated about the importance of not removing his IV due to his heparin infusion.   Reinforcement was applied to his IV's. Will check IV frequently.

## 2021-01-17 NOTE — Progress Notes (Addendum)
  Date: 01/17/2021  Patient name: Jose Hansen  Medical record number: [id number]  Date of birth: February 23, 1971   This patient's plan of care was discussed with the house staff. Please see Dr. Chase Picket note for complete details. I concur with her findings.     Sid Falcon, MD 01/17/2021, 1:01 PM

## 2021-01-17 NOTE — Progress Notes (Signed)
Pt disconnected the IV Heparin drip while sleeping. Pt states he has been having very vivid dreams.

## 2021-01-17 NOTE — Progress Notes (Signed)
Pt's IV came out at 01:40 while pt sleeping. Pt on Heparin drip.

## 2021-01-17 NOTE — Progress Notes (Signed)
VASCULAR SURGERY:  He is scheduled for an arteriogram tomorrow and possible intervention.  I discussed this with the patient this morning and all of his questions were answered.  He is agreeable to proceed.  I have written preop orders.  His renal function is normal.  Gae Gallop, MD 9:04 AM

## 2021-01-17 NOTE — Plan of Care (Signed)
  Problem: Clinical Measurements: Goal: Ability to maintain clinical measurements within normal limits will improve Outcome: Progressing Goal: Will remain free from infection Outcome: Progressing Goal: Respiratory complications will improve Outcome: Progressing Goal: Cardiovascular complication will be avoided Outcome: Progressing   Problem: Activity: Goal: Risk for activity intolerance will decrease Outcome: Progressing   

## 2021-01-18 ENCOUNTER — Inpatient Hospital Stay (HOSPITAL_COMMUNITY): Payer: Medicare Other

## 2021-01-18 ENCOUNTER — Encounter (HOSPITAL_COMMUNITY): Payer: Self-pay | Admitting: Vascular Surgery

## 2021-01-18 ENCOUNTER — Encounter (HOSPITAL_COMMUNITY)
Admission: EM | Disposition: A | Discharge status: Home Health | Payer: Self-pay | Source: Home / Self Care | Attending: Internal Medicine

## 2021-01-18 DIAGNOSIS — Z01810 Encounter for preprocedural cardiovascular examination: Secondary | ICD-10-CM | POA: Diagnosis not present

## 2021-01-18 DIAGNOSIS — R0789 Other chest pain: Secondary | ICD-10-CM | POA: Diagnosis not present

## 2021-01-18 HISTORY — PX: ABDOMINAL AORTOGRAM W/LOWER EXTREMITY: CATH118223

## 2021-01-18 LAB — CBC
HCT: 38.8 % — ABNORMAL LOW (ref 39.0–52.0)
Hemoglobin: 12.4 g/dL — ABNORMAL LOW (ref 13.0–17.0)
MCH: 27.1 pg (ref 26.0–34.0)
MCHC: 32 g/dL (ref 30.0–36.0)
MCV: 84.7 fL (ref 80.0–100.0)
Platelets: 224 10*3/uL (ref 150–400)
RBC: 4.58 MIL/uL (ref 4.22–5.81)
RDW: 13.2 % (ref 11.5–15.5)
WBC: 8.9 10*3/uL (ref 4.0–10.5)
nRBC: 0 % (ref 0.0–0.2)

## 2021-01-18 LAB — GLUCOSE, CAPILLARY: Glucose-Capillary: 101 mg/dL — ABNORMAL HIGH (ref 70–99)

## 2021-01-18 LAB — HEMOGLOBIN A1C
Hgb A1c MFr Bld: 6.6 % — ABNORMAL HIGH (ref 4.8–5.6)
Mean Plasma Glucose: 142.72 mg/dL

## 2021-01-18 SURGERY — ABDOMINAL AORTOGRAM W/LOWER EXTREMITY
Anesthesia: LOCAL

## 2021-01-18 MED ORDER — IODIXANOL 320 MG/ML IV SOLN
INTRAVENOUS | Status: DC | PRN
  Administered 2021-01-18: 70 mL via INTRA_ARTERIAL

## 2021-01-18 MED ORDER — OXYCODONE-ACETAMINOPHEN 5-325 MG PO TABS
1.0000 | ORAL_TABLET | ORAL | Status: DC | PRN
  Administered 2021-01-18 – 2021-01-23 (×15): 1 via ORAL
  Filled 2021-01-18 (×15): qty 1

## 2021-01-18 MED ORDER — SODIUM CHLORIDE 0.9 % IV SOLN: 250.0000 mL | INTRAVENOUS | Status: DC | PRN

## 2021-01-18 MED ORDER — SODIUM CHLORIDE 0.9% FLUSH
3.0000 mL | Freq: Two times a day (BID) | INTRAVENOUS | Status: DC
  Administered 2021-01-18 – 2021-01-26 (×15): 3 mL via INTRAVENOUS

## 2021-01-18 MED ORDER — HYDRALAZINE HCL 20 MG/ML IJ SOLN
5.0000 mg | INTRAMUSCULAR | Status: DC | PRN
Start: 2021-01-18 — End: 2021-01-26

## 2021-01-18 MED ORDER — LIDOCAINE HCL (PF) 1 % IJ SOLN
INTRAMUSCULAR | Status: DC | PRN
  Administered 2021-01-18: 18 mL

## 2021-01-18 MED ORDER — ONDANSETRON HCL 4 MG/2ML IJ SOLN
4.0000 mg | Freq: Four times a day (QID) | INTRAMUSCULAR | Status: DC | PRN
  Filled 2021-01-18: qty 2

## 2021-01-18 MED ORDER — SODIUM CHLORIDE 0.9 % IV SOLN: INTRAVENOUS | Status: DC

## 2021-01-18 MED ORDER — LABETALOL HCL 5 MG/ML IV SOLN
INTRAVENOUS | Status: DC | PRN
Start: 2021-01-18 — End: 2021-01-18
  Administered 2021-01-18: 10 mg via INTRAVENOUS

## 2021-01-18 MED ORDER — HEPARIN (PORCINE) IN NACL 1000-0.9 UT/500ML-% IV SOLN
INTRAVENOUS | Status: AC
  Filled 2021-01-18: qty 1000

## 2021-01-18 MED ORDER — ONDANSETRON HCL 4 MG/2ML IJ SOLN
INTRAMUSCULAR | Status: AC
  Filled 2021-01-18: qty 2

## 2021-01-18 MED ORDER — MIDAZOLAM HCL 2 MG/2ML IJ SOLN
INTRAMUSCULAR | Status: DC | PRN
  Administered 2021-01-18: 2 mg via INTRAVENOUS

## 2021-01-18 MED ORDER — LABETALOL HCL 5 MG/ML IV SOLN: 10.0000 mg | INTRAVENOUS | Status: DC | PRN

## 2021-01-18 MED ORDER — HEPARIN (PORCINE) IN NACL 1000-0.9 UT/500ML-% IV SOLN
INTRAVENOUS | Status: DC | PRN
  Administered 2021-01-18 (×2): 500 mL

## 2021-01-18 MED ORDER — LIDOCAINE HCL (PF) 1 % IJ SOLN
INTRAMUSCULAR | Status: AC
  Filled 2021-01-18: qty 30

## 2021-01-18 MED ORDER — ACETAMINOPHEN 325 MG PO TABS: 650.0000 mg | ORAL_TABLET | ORAL | Status: DC | PRN

## 2021-01-18 MED ORDER — LABETALOL HCL 5 MG/ML IV SOLN
INTRAVENOUS | Status: AC
  Filled 2021-01-18: qty 4

## 2021-01-18 MED ORDER — SENNOSIDES-DOCUSATE SODIUM 8.6-50 MG PO TABS
2.0000 | ORAL_TABLET | Freq: Every day | ORAL | Status: DC
  Administered 2021-01-18 – 2021-01-25 (×8): 2 via ORAL
  Filled 2021-01-18 (×8): qty 2

## 2021-01-18 MED ORDER — FAMOTIDINE 20 MG PO TABS
20.0000 mg | ORAL_TABLET | Freq: Every day | ORAL | Status: DC
  Administered 2021-01-18 – 2021-01-26 (×9): 20 mg via ORAL
  Filled 2021-01-18 (×9): qty 1

## 2021-01-18 MED ORDER — SODIUM CHLORIDE 0.9 % IV SOLN: INTRAVENOUS | Status: AC

## 2021-01-18 MED ORDER — ALUM & MAG HYDROXIDE-SIMETH 200-200-20 MG/5ML PO SUSP: 30.0000 mL | Freq: Four times a day (QID) | ORAL | Status: DC | PRN

## 2021-01-18 MED ORDER — OXYCODONE HCL 5 MG PO TABS
5.0000 mg | ORAL_TABLET | ORAL | Status: DC | PRN
Start: 2021-01-18 — End: 2021-01-23
  Administered 2021-01-19 – 2021-01-23 (×14): 5 mg via ORAL
  Filled 2021-01-18 (×14): qty 1

## 2021-01-18 MED ORDER — ONDANSETRON HCL 4 MG/2ML IJ SOLN
INTRAMUSCULAR | Status: DC | PRN
Start: 2021-01-18 — End: 2021-01-18
  Administered 2021-01-18: 4 mg via INTRAVENOUS

## 2021-01-18 MED ORDER — SODIUM CHLORIDE 0.9% FLUSH: 3.0000 mL | INTRAVENOUS | Status: DC | PRN

## 2021-01-18 MED ORDER — MIDAZOLAM HCL 2 MG/2ML IJ SOLN
INTRAMUSCULAR | Status: AC
  Filled 2021-01-18: qty 2

## 2021-01-18 MED ORDER — HEPARIN (PORCINE) 25000 UT/250ML-% IV SOLN
2000.0000 [IU]/h | INTRAVENOUS | Status: DC
  Administered 2021-01-18 – 2021-01-19 (×3): 2100 [IU]/h via INTRAVENOUS
  Administered 2021-01-20: 2000 [IU]/h via INTRAVENOUS
  Filled 2021-01-18 (×4): qty 250

## 2021-01-18 SURGICAL SUPPLY — 9 items
CATH OMNI FLUSH 5F 65CM (CATHETERS) ×1 IMPLANT
KIT MICROPUNCTURE NIT STIFF (SHEATH) ×1 IMPLANT
KIT PV (KITS) ×2 IMPLANT
SHEATH PINNACLE 5F 10CM (SHEATH) ×2 IMPLANT
SHEATH PINNACLE 6F 10CM (SHEATH) ×1 IMPLANT
SYR MEDRAD MARK V 150ML (SYRINGE) ×1 IMPLANT
TRANSDUCER W/STOPCOCK (MISCELLANEOUS) ×2 IMPLANT
TRAY PV CATH (CUSTOM PROCEDURE TRAY) ×2 IMPLANT
WIRE BENTSON .035X145CM (WIRE) ×1 IMPLANT

## 2021-01-18 NOTE — Progress Notes (Signed)
This chaplain is present for F/U spiritual care with the Pt. before today's procedure.  The Pt. shares the unknown continues to create anxiety as he waits.    The Pt. accepted the chaplain's invitation for prayer and continued spiritual care.

## 2021-01-18 NOTE — Progress Notes (Addendum)
Pt having vivid dreams after receiving IV dliaudid and is removing IVs during sleep. Restarted Pt on Heparin drip. MD notified about vivid dreams. See new orders.

## 2021-01-18 NOTE — Progress Notes (Signed)
VASCULAR LAB    Left lower extremity vein mapping has been performed.  See CV proc for preliminary results.   Tulani Kidney, RVT 01/18/2021, 3:38 PM

## 2021-01-18 NOTE — Plan of Care (Signed)

## 2021-01-18 NOTE — Op Note (Signed)
    Patient name: Jose Hansen MRN: [id number] DOB: 1970/12/19 Sex: male  01/18/2021 Pre-operative Diagnosis: Critical left lower extremity ischemia with rest pain Post-operative diagnosis:  Same Surgeon:  Eda Paschal. Donzetta Matters, MD Procedure Performed: 1.  Ultrasound-guided cannulation left common femoral artery 2.  Aortogram with left lower extremity runoff 3.  Moderate sedation with fentanyl and Versed for 24-minute  Indications: 50 year old male with history of aorto and left common iliac artery stenting.  He now has rest pain in his left lower extremities indicated for angiography with possible invention.  Findings: Left common iliac artery stent is patent leading into the aortic stent.  The external iliac arteries patent without flow-limiting stenosis.  Left common femoral artery is severely calcified.  The SFA occludes just after the takeoff.  He reconstitutes the popliteal eyelid and PT at the high calf.  Patient will be considered for left common femoral to posterior tibial artery bypass.  Vein has been harvested for right-sided bypass will likely be performed with PTFE   Procedure:  The patient was identified in the holding area and taken to room 8.  The patient was then placed supine on the table and prepped and draped in the usual sterile fashion.  A time out was called.  Ultrasound was used to evaluate the left common femoral artery.  This was circumferentially calcified.  There is no assessment 1% lidocaine I attempted to cannulate micropuncture needle but I could not penetrate the artery.  An 18-gauge needle was used followed by a Bentson wire.  I used a 6 [place] sheath dilator placed a 5 [place] sheath.  We then placed an Omni catheter to the level of L1 performed aortogram.  The catheter was then removed over wire.  Retrograde sheath angiography was performed to left lower extremity.  We performed dedicated views of the left foot in the lateral position.  Plan will be for left femoral to  posterior tibial artery bypass in the near future.   Contrast: 70 cc  Catlin Doria C. Donzetta Matters, MD [facility] [place] Office: [phone] Pager: [phone]

## 2021-01-18 NOTE — Interval H&P Note (Signed)
History and Physical Interval Note:  01/18/2021 11:17 AM  Jose Hansen  has presented today for surgery, with the diagnosis of PVD.  The various methods of treatment have been discussed with the patient and family. After consideration of risks, benefits and other options for treatment, the patient has consented to  Procedure(s): ABDOMINAL AORTOGRAM W/LOWER EXTREMITY (N/A) as a surgical intervention.  The patient's history has been reviewed, patient examined, no change in status, stable for surgery.  I have reviewed the patient's chart and labs.  Questions were answered to the patient's satisfaction.     Servando Snare

## 2021-01-18 NOTE — Progress Notes (Signed)
Site area: left groin a 5 french arterial sheath was removed by Delman Kitten   Site Prior to Removal:  Level 0  Pressure Applied For 20 MINUTES    Bedrest Beginning at  1245p X 4 hours  Manual:   Yes.    Patient Status During Pull:  stable  Post Pull Groin Site:  Level 0  Post Pull Instructions Given:  Yes.    Post Pull Pulses Present:  Yes.    Dressing Applied:  Yes.    Comments:

## 2021-01-18 NOTE — Progress Notes (Addendum)
ANTICOAGULATION CONSULT NOTE - Follow Up Consult  Pharmacy Consult for heparin Indication: chest pain/ACS  Allergies  Allergen Reactions   Contrast Media [Iodinated Diagnostic Agents] Nausea Only    NOT AN ALLERGY. ONLY NAUSEA. 12/11/20  Newly reported by [facility] on 12/04/20.    Patient Measurements: Height: '5\' 11"'$  (180.3 cm) Weight: 96.9 kg (213 lb 9.6 oz) IBW/kg (Calculated) : 75.3 Heparin Dosing Weight: 94 kg  Vital Signs: Temp: 98 F (36.7 C) (08/22 1317) Temp Source: Oral (08/22 1317) BP: 127/74 (08/22 1317) Pulse Rate: 53 (08/22 1317)  Labs: Recent Labs    01/16/21 0259 01/16/21 1144 01/17/21 0431 01/17/21 1054 01/18/21 0428  HGB 12.8*  --  12.5*  --  12.4*  HCT 39.7  --  39.6  --  38.8*  PLT 243  --  237  --  224  APTT 70* 80* 42*  --   --   HEPARINUNFRC 0.64  --   --  <0.10*  --   CREATININE  --   --  0.98  --   --      Estimated Creatinine Clearance: 105.8 mL/min (by C-G formula based on SCr of 0.98 mg/dL).   Assessment: 50 yo male with CP and cardiac CTA with no significant CAD. He is noted with history of PVD and multiple interventions including R AKA. 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   Pharmacy consulted to dose heparin, which was initiated at 1300 units/hr and increased to 2100 units/hr due to multiple subtherapeutic levels.   Heparin level 8/21 AM was 0.64, aPTT is 80 - within goal. CBC is stable. No bleeding or problems with infusion noted per chart review. Issues arose 8/21 PM with heparin line being removed several times per RN notes.   Heparin held 8/22 AM for angiography on Monday. Orders to resume heparin 8 hours after sheath removal. Sheath was removed 8/22 @ 1147.  Goal of Therapy:  Heparin level 0.3-0.7 units/ml aPTT 66-102 seconds Monitor platelets by anticoagulation protocol: Yes   Plan:  - Resume heparin @ 2100 units/hr 8/22 @ 20:00 - Heparin level + aPTT @ 0400 - Daily  heparin level, aPTT, CBC   Thank you for allowing pharmacy to be a part of this patient's care.  Ardyth Harps, PharmD Clinical Pharmacist

## 2021-01-18 NOTE — BH Specialist Note (Signed)
Integrated Behavioral Health via Telemedicine Visit  01/18/2021 Jose Hansen [id number]  Number of [place] visits: 1/6 Session Start time: 2:00pm  Session End time: 2:50pm Total time: 36   Referring Provider: Dr. Christiana Fuchs, DO Patient/Family location: Pt is home in private [facility] Provider location: [facility] Office All persons participating in visit: Pt & Clinician  Types of Service: Individual psychotherapy  I connected with Jose Hansen and/or Jose Hansen's  self  via  Telephone or Geologist, engineering  (Video is Tree surgeon) and verified that I am speaking with the correct person using two identifiers. Discussed confidentiality: Yes   I discussed the limitations of telemedicine and the availability of in person appointments.  Discussed there is a possibility of technology failure and discussed alternative modes of communication if that failure occurs.  I discussed that engaging in this telemedicine visit, they consent to the provision of behavioral healthcare and the services will be billed under their insurance.  Patient and/or legal guardian expressed understanding and consented to Telemedicine visit: Yes   Presenting Concerns: Patient and/or family reports the following symptoms/concerns: Pt has R-side AKA & uses a wheelchair & also cane as much as possible Duration of problem: yrs; Severity of problem: moderate to severe  Patient and/or Family's Strengths/Protective Factors: Social connections, Concrete supports in place (healthy food, safe environments, etc.), and Sense of purpose  Goals Addressed: Patient will:  Reduce symptoms of: anxiety, depression, and stress   Increase knowledge and/or ability of: coping skills, self-management skills, stress reduction, and fortify Pt resilience factors    Demonstrate ability to: Increase healthy adjustment to current life circumstances and Increase adequate support systems  for patient/family  Progress towards Goals: Estb'd today; Pt will attend psychotherapy sessions as scheduled  Interventions: Interventions utilized:  Motivational Interviewing, Solution-Focused Strategies, and Supportive Counseling Standardized Assessments completed:  screeners prn  Patient and/or Family Response: Pt receptive to call today & requests assistance w/his life situation. He is living w/his Museum/gallery conservator, his Wife & his Principal Financial. He has assistance w/ADL from his SIL.  Pt has Hx of InPt Admission in [facility] where he spent 24 days due to "voices in his head picking out his imperfections". He can tell when his medications are mixed up bc he is mean to his Niece & Nephew. He deals w/these voices sporadically.  Pt has dealt w/painful leg DVTs since Jan 04, 2021. Pt exp's R leg phantom pain daily-it feels like stabbing. In his L leg he feels pain & toe cramping.  Provided Pt w/local resources for Mental Health needs since he has only been in [place] for one month. Directed Pt to schedule a Return OV to see Dr. Howie Ill, DO. Provided contact numbers for:   [facility]'@[facility]'$ : [id number] [facility]: [id number] [facility] @ [place]: [phone], [address].  Assessment: Patient currently experiencing inc'd struggle w/mgmt of his current health status changes & his psychopharmacological medications that need a Psychiatrist. Pt directed to f/u on Referral contacts given this day.   Patient may benefit from cont's psychotherapy to segway him until Psychiatric appt is secured.  Plan: Follow up with behavioral health clinician on : 2-3 wks for 30 min telehealth Behavioral recommendations: f/u on appts needed Referral(s): [place] (In Clinic) and w/Social Worker [name], [place] or [name], RN, LCSW  I discussed the assessment and treatment plan with the patient and/or parent/guardian. They were provided an opportunity to ask questions and all  were answered. They agreed with the plan and demonstrated an understanding of the instructions.   They were advised to call back or seek an in-person evaluation if the symptoms worsen or if the condition fails to improve as anticipated.  Jose Hutching, LMFT

## 2021-01-18 NOTE — Care Management Important Message (Signed)
Important Message  Patient Details  Name: Jose Hansen MRN: [id number] Date of Birth: 1970-09-20   Medicare Important Message Given:  Yes     [name] 01/18/2021, 10:17 AM

## 2021-01-18 NOTE — Progress Notes (Addendum)
  Summary: Jose Hansen is a 50 y.o. male with severe PVD s/p multiple vascular interventions, right AKA, left femoral and popliteal artery occlusions, and recent right femoral DVT who was admitted 01/12/21 for further vascular workup of left lower extremity ischemia.   Interm history:  LLE ABI 08/20 showed slight decrease 0.63>0.49. Patient doing well. Still complaining of pain. He appeared to be nervous about the angiogram today but stated he was ready for it and ready for this pain to go away. He denied any acute concerns but stated he still had some heartburn after eating.   Objective:  Vital signs in last 24 hours: Vitals:   01/16/21 1918 01/17/21 0442 01/17/21 1137 01/17/21 2018  BP: (!) 133/55 132/81 137/69 124/70  Pulse: 90 88 76 82  Resp: '18 19 19 18  '$ Temp: 98.3 F (36.8 C) 98.5 F (36.9 C) 98.4 F (36.9 C) 98.5 F (36.9 C)  TempSrc: Oral Oral Oral Oral  SpO2: 95% 98% 98% 98%  Weight:  98.9 kg    Height:       Physical Exam: General: well appearing, in no acute distress CV: RRR, LLE cool to touch--unchanged from yesterday Lungs: CTAB, no wheezes, rhonchi, or rales Abdomen: normal bowel sounds, non-tender  Assessment/Plan:  Principal Problem:   Atypical chest pain Active Problems:   PVD (peripheral vascular disease) (HCC)   Hyperlipidemia   Prediabetes   Hx of AKA (above knee amputation), right (HCC)   Ischemia of extremity  Critical limb ischemia of the left lower extremity secondary to PAD Management per [facility] Angiogram on today NPO after midnight Therapeutic dose Lovenox Pain management: prn oxycodone, prn dilaudid Statin therapy: Lipitor '80mg'$  daily Antiplatelet therapy: Asprin 81 mg  GERD Pepcid '20mg'$  daily Maloox/Mylanta prn  Chronic depression Continue symbalta and seroquel  Right common femoral DVT Continue heparin gtt. Will let vascular surgery guide whether this needs to be held pre-operatively  Chest pain--resolved Coronary Calcium  score low, no further workup at this time   DVT prophx: Therapeutic Lovenox Code:Full  Prior to Admission Living Arrangement:home Anticipated Discharge Location: Home Barriers to Discharge: Cardiac and vascular workup Dispo: Anticipated discharge in approximately 1-2 day(s).   [name], MD Internal Medicine Resident PGY-1 [facility] Pager: [phone] 01/18/2021 5:43 AM

## 2021-01-19 ENCOUNTER — Inpatient Hospital Stay (HOSPITAL_COMMUNITY): Payer: Medicare Other

## 2021-01-19 DIAGNOSIS — N186 End stage renal disease: Secondary | ICD-10-CM | POA: Diagnosis not present

## 2021-01-19 DIAGNOSIS — R0789 Other chest pain: Secondary | ICD-10-CM | POA: Diagnosis not present

## 2021-01-19 LAB — CBC
HCT: 35.3 % — ABNORMAL LOW (ref 39.0–52.0)
Hemoglobin: 11.3 g/dL — ABNORMAL LOW (ref 13.0–17.0)
MCH: 26.9 pg (ref 26.0–34.0)
MCHC: 32 g/dL (ref 30.0–36.0)
MCV: 84 fL (ref 80.0–100.0)
Platelets: 205 10*3/uL (ref 150–400)
RBC: 4.2 MIL/uL — ABNORMAL LOW (ref 4.22–5.81)
RDW: 13.3 % (ref 11.5–15.5)
WBC: 9 10*3/uL (ref 4.0–10.5)
nRBC: 0 % (ref 0.0–0.2)

## 2021-01-19 LAB — APTT: aPTT: 70 seconds — ABNORMAL HIGH (ref 24–36)

## 2021-01-19 LAB — HEPARIN LEVEL (UNFRACTIONATED)
Heparin Unfractionated: 0.37 IU/mL (ref 0.30–0.70)
Heparin Unfractionated: 0.47 IU/mL (ref 0.30–0.70)

## 2021-01-19 MED ORDER — CEFAZOLIN SODIUM-DEXTROSE 2-4 GM/100ML-% IV SOLN
2.0000 g | INTRAVENOUS | Status: DC
  Filled 2021-01-19: qty 100

## 2021-01-19 MED ORDER — CEFAZOLIN SODIUM-DEXTROSE 1-4 GM/50ML-% IV SOLN: 1.0000 g | INTRAVENOUS | Status: DC

## 2021-01-19 MED ORDER — HYDROMORPHONE HCL 1 MG/ML IJ SOLN
1.0000 mg | INTRAMUSCULAR | Status: DC | PRN
  Administered 2021-01-19 – 2021-01-23 (×18): 1 mg via INTRAVENOUS
  Filled 2021-01-19 (×19): qty 1

## 2021-01-19 NOTE — Progress Notes (Addendum)
  Progress Note    01/19/2021 7:49 AM 1 Day Post-Op  Subjective:  says he's ok.  His big toe is very sore  afebrile  Vitals:   01/18/21 1949 01/19/21 0438  BP: (!) 133/91 132/76  Pulse: 83   Resp: 17 14  Temp: 98.6 F (37 C) 98.2 F (36.8 C)  SpO2: 92% 98%    Physical Exam: General  no distress Lungs:  non labored Incisions:  left groin is soft without hematoma Extremities:  left foot slightly cool   CBC    Component Value Date/Time   WBC 9.0 01/19/2021 0347   RBC 4.20 (L) 01/19/2021 0347   HGB 11.3 (L) 01/19/2021 0347   HGB 15.4 12/03/2020 1202   HCT 35.3 (L) 01/19/2021 0347   HCT 47.0 12/03/2020 1202   PLT 205 01/19/2021 0347   PLT 288 12/03/2020 1202   MCV 84.0 01/19/2021 0347   MCV 81 12/03/2020 1202   MCH 26.9 01/19/2021 0347   MCHC 32.0 01/19/2021 0347   RDW 13.3 01/19/2021 0347   RDW 13.5 12/03/2020 1202   LYMPHSABS 3.5 01/12/2021 2145   LYMPHSABS 3.0 12/03/2020 1202   MONOABS 0.8 01/12/2021 2145   EOSABS 0.3 01/12/2021 2145   EOSABS 0.4 12/03/2020 1202   BASOSABS 0.1 01/12/2021 2145   BASOSABS 0.1 12/03/2020 1202    BMET    Component Value Date/Time   NA 135 01/17/2021 0431   NA 145 (H) 12/03/2020 1202   K 4.2 01/17/2021 0431   CL 103 01/17/2021 0431   CO2 25 01/17/2021 0431   GLUCOSE 264 (H) 01/17/2021 0431   BUN 10 01/17/2021 0431   BUN 11 12/03/2020 1202   CREATININE 0.98 01/17/2021 0431   CALCIUM 9.1 01/17/2021 0431   GFRNONAA >60 01/17/2021 0431    INR    Component Value Date/Time   INR 1.0 01/12/2021 2145     Intake/Output Summary (Last 24 hours) at 01/19/2021 0749 Last data filed at 01/19/2021 0443 Gross per 24 hour  Intake 3211.81 ml  Output 2000 ml  Net 1211.81 ml     Assessment/Plan:  50 y.o. male is s/p:  Aortogram with LLE runoff  1 Day Post-Op   -pt doing well this am.  Left groin is soft without hematoma.  He states his foot is about the same.  Discussed bypass surgery tomorrow.  He is hesitant to go  through with it if he is ultimately going to end up with amputation.  He will have more detailed conversation with Dr. Donzetta Matters later today.   -npo after MN, labs.  Will wait for Dr. Donzetta Matters to speak with pt before putting consent order in.  -DVT prophylaxis:  heparin gtt   Leontine Locket, PA-C [facility] [phone] 01/19/2021 7:49 AM  I have independently interviewed and examined patient and agree with PA assessment and plan above.  Unfortunately does not appear to have suitable saphenous vein on the left.  We will plan for left common femoral to posterior tibial artery bypass with likely graft.  I have ordered vein mapping today of his upper extremities.  We discussed the risk benefits and alternatives of surgery he demonstrates good understanding.  Edwyna Dangerfield C. Donzetta Matters, MD [facility] [place] Office: [phone] Pager: [phone]

## 2021-01-19 NOTE — Progress Notes (Signed)
Bilateral upper extremity vein mapping has been completed. Preliminary results can be found in CV Proc through chart review.   01/19/21 1:18 PM Jose Hansen RVT

## 2021-01-19 NOTE — Progress Notes (Signed)
ANTICOAGULATION CONSULT NOTE - Follow Up Consult  Pharmacy Consult for heparin Indication: chest pain/ACS  Allergies  Allergen Reactions   Contrast Media [Iodinated Diagnostic Agents] Nausea Only    NOT AN ALLERGY. ONLY NAUSEA. 12/11/20  Newly reported by [facility] on 12/04/20.    Patient Measurements: Height: '5\' 11"'$  (180.3 cm) Weight: 97.1 kg (214 lb 1.6 oz) IBW/kg (Calculated) : 75.3 Heparin Dosing Weight: 94 kg  Vital Signs: Temp: 98.6 F (37 C) (08/23 1158) Temp Source: Oral (08/23 1158) BP: 154/83 (08/23 1158) Pulse Rate: 87 (08/23 1158)  Labs: Recent Labs    01/17/21 0431 01/17/21 1054 01/18/21 0428 01/19/21 0347 01/19/21 1210  HGB 12.5*  --  12.4* 11.3*  --   HCT 39.6  --  38.8* 35.3*  --   PLT 237  --  224 205  --   APTT 42*  --   --  70*  --   HEPARINUNFRC  --  <0.10*  --  0.37 0.47  CREATININE 0.98  --   --   --   --      Estimated Creatinine Clearance: 106 mL/min (by C-G formula based on SCr of 0.98 mg/dL).   Assessment: 50 yo male with CP and cardiac CTA with no significant CAD. He is noted with history of PVD and multiple interventions including R AKA. 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   Heparin held 8/22 AM for angiography on Monday. Orders to resume heparin 8 hours after sheath removal. Sheath was removed 8/22 @ 1147. Heparin restarted at 2000. 8/23 0400 Heparin level 0.37, PTT 70 sec (both therapeutic and correlating) on gtt at 2100 units/hr. Repeat heparin level 0.47. Will continue at current rate and check heparin levels with AM labs.  Goal of Therapy:  Heparin level 0.3-0.7 units/ml Monitor platelets by anticoagulation protocol: Yes   Plan:  - Continue heparin @ 2100 units/hr - Stop aPTT monitoring - Continue daily heparin level, CBC   Thank you for allowing pharmacy to be a part of this patient's care.  Ardyth Harps, PharmD Clinical Pharmacist

## 2021-01-19 NOTE — Progress Notes (Signed)
Pt turned off IV pump due to beeping without notifying staff that it was beeping. Pump was turned off at 0400 and was found at 0430. IV heparin and NS restarted. Pt educated and repeated back importance of heparin and educated to call if the machine is beeping. Pharmacist was notified. IV labs had been drawn before pt turned off IV pump.

## 2021-01-19 NOTE — Progress Notes (Signed)
  Summary: Jose Hansen is a 50 y.o. male with severe PVD s/p multiple vascular interventions, right AKA, left femoral and popliteal artery occlusions, and recent right femoral DVT who was admitted 01/12/21 for further vascular workup of left lower extremity ischemia.   Interm history:   Patient feeling well. States his leg feels more like a leg after the procedure. I informed him no intervention was performed yet. No other acute concerns endorsed. There was documentation regarding vivid dream overnight but patient denied having any dreams. He stated he didn't get his dilaudid because it was discontinued but didn't have any vivid dreams.     Objective:  Vital signs in last 24 hours: Vitals:   01/18/21 1615 01/18/21 1949 01/19/21 0056 01/19/21 0438  BP: 126/71 (!) 133/91  132/76  Pulse: 72 83    Resp: '17 17  14  '$ Temp: 98 F (36.7 C) 98.6 F (37 C)  98.2 F (36.8 C)  TempSrc: Oral Oral  Oral  SpO2: 98% 92%  98%  Weight:   97.1 kg   Height:       Physical Exam: General: well appearing, in no acute distress CV: RRR, LLE warm to touch  Lungs: CTAB, no wheezes, rhonchi, or rales Abdomen: normal bowel sounds, non-tender  Assessment/Plan:  Principal Problem:   Atypical chest pain Active Problems:   PVD (peripheral vascular disease) (HCC)   Hyperlipidemia   Prediabetes   Hx of AKA (above knee amputation), right (HCC)   Ischemia of extremity  Critical limb ischemia of the left lower extremity secondary to PAD Management per [facility] Bypass on Wednesday Heparin per pharmay Pain management: prn oxycodone, prn dilaudid Statin therapy: Lipitor '80mg'$  daily Antiplatelet therapy: Asprin 81 mg  GERD Pepcid '20mg'$  daily Maloox/Mylanta prn  Chronic depression Continue symbalta and seroquel  Right common femoral DVT Continue heparin gtt. Will let vascular surgery guide whether this needs to be held pre-operatively  Chest pain--resolved Coronary Calcium score low, no further  workup at this time   DVT prophx: Heparin Code:Full  Prior to Admission Living Arrangement:home Anticipated Discharge Location: Home Barriers to Discharge: vascular workup Dispo: Anticipated discharge in approximately 2 day(s).   [name], MD Internal Medicine Resident PGY-1 [facility] Pager: [phone] 01/19/2021 8:44 AM

## 2021-01-19 NOTE — Progress Notes (Signed)
ANTICOAGULATION CONSULT NOTE - Follow Up Consult  Pharmacy Consult for heparin Indication: chest pain/ACS  Allergies  Allergen Reactions   Contrast Media [Iodinated Diagnostic Agents] Nausea Only    NOT AN ALLERGY. ONLY NAUSEA. 12/11/20  Newly reported by [facility] on 12/04/20.    Patient Measurements: Height: '5\' 11"'$  (180.3 cm) Weight: 97.1 kg (214 lb 1.6 oz) IBW/kg (Calculated) : 75.3 Heparin Dosing Weight: 94 kg  Vital Signs: Temp: 98.2 F (36.8 C) (08/23 0438) Temp Source: Oral (08/23 0438) BP: 132/76 (08/23 0438) Pulse Rate: 83 (08/22 1949)  Labs: Recent Labs    01/16/21 1144 01/17/21 0431 01/17/21 0431 01/17/21 1054 01/18/21 0428 01/19/21 0347  HGB  --  12.5*   < >  --  12.4* 11.3*  HCT  --  39.6  --   --  38.8* 35.3*  PLT  --  237  --   --  224 205  APTT 80* 42*  --   --   --   --   HEPARINUNFRC  --   --   --  <0.10*  --  0.37  CREATININE  --  0.98  --   --   --   --    < > = values in this interval not displayed.     Estimated Creatinine Clearance: 106 mL/min (by C-G formula based on SCr of 0.98 mg/dL).   Assessment: 50 yo male with CP and cardiac CTA with no significant CAD. He is noted with history of PVD and multiple interventions including R AKA. 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 Pharmacy consulted to dose heparin.  Heparin level 0.37, PTT 70 sec (both therapeutic and seem to be correlating) on gtt at 2100 units/hr. RN called and pt turned off his heparin from ~0400-0500. Luckily the labs were drawn ~0350 so appear to be accurate.  Goal of Therapy:  Heparin level 0.3-0.7 units/ml Monitor platelets by anticoagulation protocol: Yes   Plan:  Continue heparin infusion @ 2100 units/hr  No more aPTT F/u daily heparin level and aPTT   Thank you for allowing pharmacy to be a part of this patient's care.  Sherlon Handing, PharmD, BCPS Please see amion for complete clinical pharmacist phone  list 01/19/2021 5:37 AM

## 2021-01-20 ENCOUNTER — Inpatient Hospital Stay (HOSPITAL_COMMUNITY): Payer: Medicare Other | Admitting: Certified Registered Nurse Anesthetist

## 2021-01-20 ENCOUNTER — Encounter (HOSPITAL_COMMUNITY): Payer: Self-pay | Admitting: Internal Medicine

## 2021-01-20 ENCOUNTER — Encounter (HOSPITAL_COMMUNITY)
Admission: EM | Disposition: A | Discharge status: Home Health | Payer: Self-pay | Source: Home / Self Care | Attending: Internal Medicine

## 2021-01-20 DIAGNOSIS — I70222 Atherosclerosis of native arteries of extremities with rest pain, left leg: Secondary | ICD-10-CM

## 2021-01-20 HISTORY — PX: FEMORAL-TIBIAL BYPASS GRAFT: SHX938

## 2021-01-20 LAB — HEPARIN LEVEL (UNFRACTIONATED)
Heparin Unfractionated: 0.75 IU/mL — ABNORMAL HIGH (ref 0.30–0.70)
Heparin Unfractionated: 0.61 IU/mL (ref 0.30–0.70)

## 2021-01-20 LAB — CBC
HCT: 37.8 % — ABNORMAL LOW (ref 39.0–52.0)
Hemoglobin: 12.3 g/dL — ABNORMAL LOW (ref 13.0–17.0)
MCH: 27.2 pg (ref 26.0–34.0)
MCHC: 32.5 g/dL (ref 30.0–36.0)
MCV: 83.4 fL (ref 80.0–100.0)
Platelets: 211 10*3/uL (ref 150–400)
RBC: 4.53 MIL/uL (ref 4.22–5.81)
RDW: 13.2 % (ref 11.5–15.5)
WBC: 8.5 10*3/uL (ref 4.0–10.5)
nRBC: 0 % (ref 0.0–0.2)

## 2021-01-20 LAB — TYPE AND SCREEN
ABO/RH(D): AB POS
Antibody Screen: NEGATIVE

## 2021-01-20 LAB — BASIC METABOLIC PANEL
Anion gap: 8 (ref 5–15)
BUN: 9 mg/dL (ref 6–20)
CO2: 24 mmol/L (ref 22–32)
Calcium: 9.1 mg/dL (ref 8.9–10.3)
Chloride: 105 mmol/L (ref 98–111)
Creatinine, Ser: 1.03 mg/dL (ref 0.61–1.24)
GFR, Estimated: >60 mL/min (ref >60)
Glucose, Bld: 109 mg/dL — ABNORMAL HIGH (ref 70–99)
Potassium: 4.2 mmol/L (ref 3.5–5.1)
Sodium: 137 mmol/L (ref 135–145)

## 2021-01-20 LAB — SURGICAL PCR SCREEN
MRSA, PCR: POSITIVE — AB
Staphylococcus aureus: POSITIVE — AB

## 2021-01-20 LAB — ABO/RH: ABO/RH(D): AB POS

## 2021-01-20 SURGERY — CREATION, BYPASS, ARTERIAL, FEMORAL TO TIBIAL, USING GRAFT
Anesthesia: General | Laterality: Left

## 2021-01-20 MED ORDER — HEPARIN 6000 UNIT IRRIGATION SOLUTION
Status: DC | PRN
  Administered 2021-01-20: 1

## 2021-01-20 MED ORDER — LIDOCAINE 2% (20 MG/ML) 5 ML SYRINGE
INTRAMUSCULAR | Status: DC | PRN
  Administered 2021-01-20: 20 mg via INTRAVENOUS

## 2021-01-20 MED ORDER — HYDROMORPHONE HCL 1 MG/ML IJ SOLN
INTRAMUSCULAR | Status: AC
  Filled 2021-01-20: qty 0.5

## 2021-01-20 MED ORDER — CHLORHEXIDINE GLUCONATE CLOTH 2 % EX PADS
6.0000 | MEDICATED_PAD | Freq: Every day | CUTANEOUS | Status: DC
  Administered 2021-01-21 – 2021-01-26 (×5): 6 via TOPICAL

## 2021-01-20 MED ORDER — METOPROLOL TARTRATE 5 MG/5ML IV SOLN: 2.0000 mg | INTRAVENOUS | Status: DC | PRN

## 2021-01-20 MED ORDER — DEXAMETHASONE SODIUM PHOSPHATE 10 MG/ML IJ SOLN
INTRAMUSCULAR | Status: AC
  Filled 2021-01-20: qty 1

## 2021-01-20 MED ORDER — PANTOPRAZOLE SODIUM 40 MG PO TBEC
40.0000 mg | DELAYED_RELEASE_TABLET | Freq: Every day | ORAL | Status: DC
  Administered 2021-01-21 – 2021-01-26 (×6): 40 mg via ORAL
  Filled 2021-01-20 (×6): qty 1

## 2021-01-20 MED ORDER — EPHEDRINE 5 MG/ML INJ
INTRAVENOUS | Status: AC
  Filled 2021-01-20: qty 10

## 2021-01-20 MED ORDER — ONDANSETRON HCL 4 MG/2ML IJ SOLN
INTRAMUSCULAR | Status: AC
  Filled 2021-01-20: qty 2

## 2021-01-20 MED ORDER — HYDROMORPHONE HCL 1 MG/ML IJ SOLN
INTRAMUSCULAR | Status: AC
  Administered 2021-01-21: 1 mg via INTRAVENOUS
  Filled 2021-01-20: qty 1

## 2021-01-20 MED ORDER — PROPOFOL 10 MG/ML IV BOLUS
INTRAVENOUS | Status: DC | PRN
  Administered 2021-01-20: 150 mg via INTRAVENOUS

## 2021-01-20 MED ORDER — OXYCODONE HCL 5 MG PO TABS: 5.0000 mg | ORAL_TABLET | Freq: Once | ORAL | Status: DC | PRN

## 2021-01-20 MED ORDER — 0.9 % SODIUM CHLORIDE (POUR BTL) OPTIME
TOPICAL | Status: DC | PRN
  Administered 2021-01-20 (×2): 1000 mL

## 2021-01-20 MED ORDER — LACTATED RINGERS IV SOLN: INTRAVENOUS | Status: DC | PRN

## 2021-01-20 MED ORDER — SODIUM CHLORIDE 0.9 % IV SOLN
500.0000 mL | Freq: Once | INTRAVENOUS | Status: DC | PRN
Start: 2021-01-20 — End: 2021-01-26

## 2021-01-20 MED ORDER — SODIUM CHLORIDE 0.9 % IV SOLN: INTRAVENOUS | Status: DC

## 2021-01-20 MED ORDER — HEPARIN SODIUM (PORCINE) 1000 UNIT/ML IJ SOLN
INTRAMUSCULAR | Status: AC
  Filled 2021-01-20: qty 1

## 2021-01-20 MED ORDER — HYDROMORPHONE HCL 1 MG/ML IJ SOLN
0.2500 mg | INTRAMUSCULAR | Status: DC | PRN
  Administered 2021-01-20: 0.5 mg via INTRAVENOUS

## 2021-01-20 MED ORDER — POTASSIUM CHLORIDE CRYS ER 20 MEQ PO TBCR: 20.0000 meq | EXTENDED_RELEASE_TABLET | Freq: Every day | ORAL | Status: DC | PRN

## 2021-01-20 MED ORDER — CEFAZOLIN SODIUM-DEXTROSE 2-3 GM-%(50ML) IV SOLR
INTRAVENOUS | Status: DC | PRN
  Administered 2021-01-20 (×2): 2 g via INTRAVENOUS

## 2021-01-20 MED ORDER — PROMETHAZINE HCL 25 MG/ML IJ SOLN: 6.2500 mg | INTRAMUSCULAR | Status: DC | PRN

## 2021-01-20 MED ORDER — LACTATED RINGERS IV SOLN: INTRAVENOUS | Status: DC

## 2021-01-20 MED ORDER — CEFAZOLIN SODIUM-DEXTROSE 2-4 GM/100ML-% IV SOLN
2.0000 g | Freq: Three times a day (TID) | INTRAVENOUS | Status: AC
Start: 2021-01-21 — End: 2021-01-21
  Administered 2021-01-21 (×2): 2 g via INTRAVENOUS
  Filled 2021-01-20 (×2): qty 100

## 2021-01-20 MED ORDER — OXYCODONE HCL 5 MG/5ML PO SOLN: 5.0000 mg | Freq: Once | ORAL | Status: DC | PRN

## 2021-01-20 MED ORDER — HEPARIN SODIUM (PORCINE) 1000 UNIT/ML IJ SOLN
INTRAMUSCULAR | Status: DC | PRN
  Administered 2021-01-20: 10000 [IU] via INTRAVENOUS

## 2021-01-20 MED ORDER — ROCURONIUM BROMIDE 10 MG/ML (PF) SYRINGE
PREFILLED_SYRINGE | INTRAVENOUS | Status: DC | PRN
  Administered 2021-01-20: 30 mg via INTRAVENOUS
  Administered 2021-01-20: 20 mg via INTRAVENOUS
  Administered 2021-01-20 (×2): 10 mg via INTRAVENOUS
  Administered 2021-01-20: 70 mg via INTRAVENOUS

## 2021-01-20 MED ORDER — PHENOL 1.4 % MT LIQD: 1.0000 | OROMUCOSAL | Status: DC | PRN

## 2021-01-20 MED ORDER — FENTANYL CITRATE (PF) 250 MCG/5ML IJ SOLN
INTRAMUSCULAR | Status: AC
  Filled 2021-01-20: qty 5

## 2021-01-20 MED ORDER — CHLORHEXIDINE GLUCONATE 0.12 % MT SOLN
15.0000 mL | Freq: Once | OROMUCOSAL | Status: AC
  Administered 2021-01-20: 15 mL via OROMUCOSAL
  Filled 2021-01-20: qty 15

## 2021-01-20 MED ORDER — MIDAZOLAM HCL 5 MG/5ML IJ SOLN
INTRAMUSCULAR | Status: DC | PRN
  Administered 2021-01-20: 1 mg via INTRAVENOUS

## 2021-01-20 MED ORDER — ROCURONIUM BROMIDE 10 MG/ML (PF) SYRINGE
PREFILLED_SYRINGE | INTRAVENOUS | Status: AC
  Filled 2021-01-20: qty 10

## 2021-01-20 MED ORDER — GUAIFENESIN-DM 100-10 MG/5ML PO SYRP: 15.0000 mL | ORAL_SOLUTION | ORAL | Status: DC | PRN

## 2021-01-20 MED ORDER — PROTAMINE SULFATE 10 MG/ML IV SOLN
INTRAVENOUS | Status: DC | PRN
  Administered 2021-01-20: 5 mg via INTRAVENOUS
  Administered 2021-01-20: 20 mg via INTRAVENOUS

## 2021-01-20 MED ORDER — HEPARIN 6000 UNIT IRRIGATION SOLUTION
Status: AC
  Filled 2021-01-20: qty 500

## 2021-01-20 MED ORDER — MUPIROCIN 2 % EX OINT
1.0000 "application " | TOPICAL_OINTMENT | Freq: Two times a day (BID) | CUTANEOUS | Status: AC
  Administered 2021-01-20 – 2021-01-24 (×10): 1 via NASAL
  Filled 2021-01-20 (×5): qty 22

## 2021-01-20 MED ORDER — MEPERIDINE HCL 25 MG/ML IJ SOLN: 6.2500 mg | INTRAMUSCULAR | Status: DC | PRN

## 2021-01-20 MED ORDER — MIDAZOLAM HCL 2 MG/2ML IJ SOLN: 0.5000 mg | Freq: Once | INTRAMUSCULAR | Status: DC | PRN

## 2021-01-20 MED ORDER — FENTANYL CITRATE (PF) 250 MCG/5ML IJ SOLN
INTRAMUSCULAR | Status: DC | PRN
  Administered 2021-01-20 (×5): 50 ug via INTRAVENOUS

## 2021-01-20 MED ORDER — DEXAMETHASONE SODIUM PHOSPHATE 10 MG/ML IJ SOLN
INTRAMUSCULAR | Status: DC | PRN
  Administered 2021-01-20: 5 mg via INTRAVENOUS

## 2021-01-20 MED ORDER — HEMOSTATIC AGENTS (NO CHARGE) OPTIME
TOPICAL | Status: DC | PRN
Start: 2021-01-20 — End: 2021-01-20
  Administered 2021-01-20: 1 via TOPICAL

## 2021-01-20 MED ORDER — HYDROMORPHONE HCL 1 MG/ML IJ SOLN
INTRAMUSCULAR | Status: DC | PRN
  Administered 2021-01-20: .5 mg via INTRAVENOUS

## 2021-01-20 MED ORDER — MIDAZOLAM HCL 2 MG/2ML IJ SOLN
INTRAMUSCULAR | Status: AC
  Filled 2021-01-20: qty 2

## 2021-01-20 MED ORDER — MAGNESIUM SULFATE 2 GM/50ML IV SOLN: 2.0000 g | Freq: Every day | INTRAVENOUS | Status: DC | PRN

## 2021-01-20 MED ORDER — HEPARIN (PORCINE) 25000 UT/250ML-% IV SOLN
2000.0000 [IU]/h | INTRAVENOUS | Status: AC
  Administered 2021-01-21 – 2021-01-24 (×6): 2000 [IU]/h via INTRAVENOUS
  Filled 2021-01-20 (×6): qty 250

## 2021-01-20 MED ORDER — ORAL CARE MOUTH RINSE: 15.0000 mL | Freq: Once | OROMUCOSAL | Status: AC

## 2021-01-20 MED ORDER — SUGAMMADEX SODIUM 200 MG/2ML IV SOLN
INTRAVENOUS | Status: DC | PRN
  Administered 2021-01-20: 200 mg via INTRAVENOUS

## 2021-01-20 MED ORDER — DOCUSATE SODIUM 100 MG PO CAPS
100.0000 mg | ORAL_CAPSULE | Freq: Every day | ORAL | Status: DC
  Administered 2021-01-21 – 2021-01-26 (×6): 100 mg via ORAL
  Filled 2021-01-20 (×6): qty 1

## 2021-01-20 MED ORDER — ONDANSETRON HCL 4 MG/2ML IJ SOLN
INTRAMUSCULAR | Status: DC | PRN
  Administered 2021-01-20: 4 mg via INTRAVENOUS

## 2021-01-20 MED ORDER — ESMOLOL HCL 100 MG/10ML IV SOLN
INTRAVENOUS | Status: DC | PRN
  Administered 2021-01-20: 20 mg via INTRAVENOUS

## 2021-01-20 SURGICAL SUPPLY — 65 items
ADH SKN CLS APL DERMABOND .7 (GAUZE/BANDAGES/DRESSINGS) ×1
BAG COUNTER SPONGE SURGICOUNT (BAG) ×2 IMPLANT
BAG SPNG CNTER NS LX DISP (BAG) ×1
BANDAGE ESMARK 6X9 LF (GAUZE/BANDAGES/DRESSINGS) IMPLANT
BNDG CMPR 9X6 STRL LF SNTH (GAUZE/BANDAGES/DRESSINGS)
BNDG ESMARK 6X9 LF (GAUZE/BANDAGES/DRESSINGS)
CANISTER SUCT 3000ML PPV (MISCELLANEOUS) ×2 IMPLANT
CANNULA VESSEL 3MM 2 BLNT TIP (CANNULA) IMPLANT
CLIP LIGATING EXTRA MED SLVR (CLIP) ×2 IMPLANT
CLIP LIGATING EXTRA SM BLUE (MISCELLANEOUS) ×2 IMPLANT
CLIP VESOCCLUDE SM WIDE 24/CT (CLIP) ×2 IMPLANT
COVER PROBE W GEL 5X96 (DRAPES) ×1 IMPLANT
CUFF TOURN SGL QUICK 24 (TOURNIQUET CUFF)
CUFF TOURN SGL QUICK 34 (TOURNIQUET CUFF)
CUFF TOURN SGL QUICK 42 (TOURNIQUET CUFF) IMPLANT
CUFF TRNQT CYL 24X4X16.5-23 (TOURNIQUET CUFF) IMPLANT
CUFF TRNQT CYL 34X4.125X (TOURNIQUET CUFF) IMPLANT
DERMABOND ADVANCED (GAUZE/BANDAGES/DRESSINGS) ×1
DERMABOND ADVANCED .7 DNX12 (GAUZE/BANDAGES/DRESSINGS) ×1 IMPLANT
DRAIN CHANNEL 15F RND FF W/TCR (WOUND CARE) IMPLANT
DRAPE C-ARM 42X72 X-RAY (DRAPES) IMPLANT
DRAPE HALF SHEET 40X57 (DRAPES) IMPLANT
DRSG COVADERM 4X10 (GAUZE/BANDAGES/DRESSINGS) ×1 IMPLANT
ELECT CAUTERY BLADE 6.4 (BLADE) ×1 IMPLANT
ELECT REM PT RETURN 9FT ADLT (ELECTROSURGICAL) ×2
ELECTRODE REM PT RTRN 9FT ADLT (ELECTROSURGICAL) ×1 IMPLANT
EVACUATOR SILICONE 100CC (DRAIN) IMPLANT
GLOVE SURG ENC MOIS LTX SZ7.5 (GLOVE) ×2 IMPLANT
GOWN STRL REUS W/ TWL LRG LVL3 (GOWN DISPOSABLE) ×2 IMPLANT
GOWN STRL REUS W/ TWL XL LVL3 (GOWN DISPOSABLE) ×1 IMPLANT
GOWN STRL REUS W/TWL LRG LVL3 (GOWN DISPOSABLE) ×4
GOWN STRL REUS W/TWL XL LVL3 (GOWN DISPOSABLE) ×2
GRAFT PROPATEN THIN WALL 6X80 (Vascular Products) IMPLANT
GRAFT PROPATEN W/RING 6X80X60 (Vascular Products) ×1 IMPLANT
HEMOSTAT SNOW SURGICEL 2X4 (HEMOSTASIS) IMPLANT
INSERT FOGARTY SM (MISCELLANEOUS) IMPLANT
KIT BASIN OR (CUSTOM PROCEDURE TRAY) ×2 IMPLANT
KIT TURNOVER KIT B (KITS) ×2 IMPLANT
MARKER GRAFT CORONARY BYPASS (MISCELLANEOUS) IMPLANT
NS IRRIG 1000ML POUR BTL (IV SOLUTION) ×4 IMPLANT
PACK PERIPHERAL VASCULAR (CUSTOM PROCEDURE TRAY) ×2 IMPLANT
PAD ARMBOARD 7.5X6 YLW CONV (MISCELLANEOUS) ×4 IMPLANT
PENCIL BUTTON HOLSTER BLD 10FT (ELECTRODE) ×1 IMPLANT
POWDER SURGICEL 3.0 GRAM (HEMOSTASIS) ×1 IMPLANT
SET COLLECT BLD 21X3/4 12 (NEEDLE) IMPLANT
SPONGE T-LAP 18X18 ~~LOC~~+RFID (SPONGE) ×1 IMPLANT
STAPLER VISISTAT 35W (STAPLE) ×1 IMPLANT
SUT ETHILON 3 0 PS 1 (SUTURE) IMPLANT
SUT MNCRL AB 4-0 PS2 18 (SUTURE) ×4 IMPLANT
SUT PROLENE 4 0 RB 1 (SUTURE) ×6
SUT PROLENE 4-0 RB1 .5 CRCL 36 (SUTURE) IMPLANT
SUT PROLENE 5 0 C 1 24 (SUTURE) ×3 IMPLANT
SUT PROLENE 6 0 BV (SUTURE) ×5 IMPLANT
SUT SILK 2 0 SH (SUTURE) ×2 IMPLANT
SUT SILK 3 0 (SUTURE)
SUT SILK 3-0 18XBRD TIE 12 (SUTURE) IMPLANT
SUT VIC AB 2-0 CT1 18 (SUTURE) ×2 IMPLANT
SUT VIC AB 2-0 CT1 27 (SUTURE) ×6
SUT VIC AB 2-0 CT1 TAPERPNT 27 (SUTURE) ×2 IMPLANT
SUT VIC AB 3-0 SH 27 (SUTURE) ×4
SUT VIC AB 3-0 SH 27X BRD (SUTURE) ×2 IMPLANT
TOWEL GREEN STERILE (TOWEL DISPOSABLE) ×2 IMPLANT
TRAY FOLEY MTR SLVR 16FR STAT (SET/KITS/TRAYS/PACK) ×2 IMPLANT
UNDERPAD 30X36 HEAVY ABSORB (UNDERPADS AND DIAPERS) ×2 IMPLANT
WATER STERILE IRR 1000ML POUR (IV SOLUTION) ×2 IMPLANT

## 2021-01-20 NOTE — Progress Notes (Signed)
  Summary: Jose Hansen is a 50 y.o. male with severe PVD s/p multiple vascular interventions, right AKA, left femoral and popliteal artery occlusions, and recent right femoral DVT who was admitted 01/12/21 for further vascular workup of left lower extremity ischemia. Artery bypass on 01/20/21.  Objective:  Patient stated he feels well. He still has pain in the left lower extremity but is much improved. Stated bladder and bowel function is well. Endorsed anxiousness regarding the bypass procedure today but wanted to get it over with.     Objective:  Vital signs in last 24 hours: Vitals:   01/19/21 0438 01/19/21 1158 01/19/21 1955 01/20/21 0450  BP: 132/76 (!) 154/83 (!) 159/72 138/82  Pulse:  87 81 70  Resp: '14 20 17 18  '$ Temp: 98.2 F (36.8 C) 98.6 F (37 C) 98.2 F (36.8 C) 98 F (36.7 C)  TempSrc: Oral Oral Oral Oral  SpO2: 98% 97% 96% 95%  Weight:      Height:       Physical Exam: General: well appearing, in no acute distress CV: RRR, LLE cool to touch compared to upper extremity Lungs: CTAB, no wheezes, rhonchi, or rales Abdomen: normal bowel sounds, non-tender Skin: No hematoma at left groin. Assessment/Plan:  Principal Problem:   Atypical chest pain Active Problems:   PVD (peripheral vascular disease) (HCC)   Hyperlipidemia   Prediabetes   Hx of AKA (above knee amputation), right (HCC)   Ischemia of extremity  Critical limb ischemia of the left lower extremity secondary to PAD Management per VVS Bypass on later today NPO since midnight No hematoma observed in left groin. Heparin per pharmay Pain management: prn oxycodone, prn dilaudid Statin therapy: Lipitor '80mg'$  daily Antiplatelet therapy: Asprin 81 mg  GERD Pepcid '20mg'$  daily Maloox/Mylanta prn  Chronic depression Continue symbalta and seroquel  Right common femoral DVT Held heparin gtt by vascular. Will let the manage for now.   Chest pain--resolved Coronary Calcium score low, no further  workup at this time   DVT prophx: Heparin Code:Full  Prior to Admission Living Arrangement:home Anticipated Discharge Location: Home Barriers to Discharge: vascular workup Dispo: Anticipated discharge in approximately 2 day(s).   Jose Schuller, MD Internal Medicine Resident PGY-1 [facility] Pager: [phone] 01/20/2021 7:57 AM

## 2021-01-20 NOTE — Progress Notes (Signed)
  Progress Note    01/20/2021 3:12 PM Day of Surgery  Subjective: Still having left leg pain    Vitals:   01/20/21 1318 01/20/21 1501  BP: (!) 142/95 (!) 163/87  Pulse: 76 75  Resp: 17 18  Temp: 97.9 F (36.6 C) 98.4 F (36.9 C)  SpO2: 99% 100%    Physical Exam: Awake alert oriented On the respirations Left foot does not feel cool does have dark discoloration  CBC    Component Value Date/Time   WBC 8.5 01/20/2021 0449   RBC 4.53 01/20/2021 0449   HGB 12.3 (L) 01/20/2021 0449   HGB 15.4 12/03/2020 1202   HCT 37.8 (L) 01/20/2021 0449   HCT 47.0 12/03/2020 1202   PLT 211 01/20/2021 0449   PLT 288 12/03/2020 1202   MCV 83.4 01/20/2021 0449   MCV 81 12/03/2020 1202   MCH 27.2 01/20/2021 0449   MCHC 32.5 01/20/2021 0449   RDW 13.2 01/20/2021 0449   RDW 13.5 12/03/2020 1202   LYMPHSABS 3.5 01/12/2021 2145   LYMPHSABS 3.0 12/03/2020 1202   MONOABS 0.8 01/12/2021 2145   EOSABS 0.3 01/12/2021 2145   EOSABS 0.4 12/03/2020 1202   BASOSABS 0.1 01/12/2021 2145   BASOSABS 0.1 12/03/2020 1202    BMET    Component Value Date/Time   NA 137 01/20/2021 0449   NA 145 (H) 12/03/2020 1202   K 4.2 01/20/2021 0449   CL 105 01/20/2021 0449   CO2 24 01/20/2021 0449   GLUCOSE 109 (H) 01/20/2021 0449   BUN 9 01/20/2021 0449   BUN 11 12/03/2020 1202   CREATININE 1.03 01/20/2021 0449   CALCIUM 9.1 01/20/2021 0449   GFRNONAA >60 01/20/2021 0449    INR    Component Value Date/Time   INR 1.0 01/12/2021 2145     Intake/Output Summary (Last 24 hours) at 01/20/2021 1512 Last data filed at 01/20/2021 0656 Gross per 24 hour  Intake 2017.61 ml  Output 1400 ml  Net 617.61 ml     Assessment/plan:  50 y.o. male is s/p left lower extremity angiography with occluded SFA popliteal arteries.  Due to rest pain high risk of amputation we will plan for left femoral to posterior tibial artery bypass.  He does not appear to have suitable vein for bypass so we will plan to find some  aspect of vein use as a patch.  I discussed the limb threatening nature of his current disease and the risk of limb loss without surgery.  We also discussed the risk benefits of the surgery he demonstrates good understanding.    Tank Difiore C. Donzetta Matters, MD [facility] Office: [phone] Pager: [phone]  01/20/2021 3:12 PM

## 2021-01-20 NOTE — Anesthesia Procedure Notes (Signed)
Procedure Name: Intubation Date/Time: 01/20/2021 3:53 PM Performed by: Janene Harvey, CRNA Pre-anesthesia Checklist: Patient identified, Emergency Drugs available, Suction available and Patient being monitored Patient Re-evaluated:Patient Re-evaluated prior to induction Oxygen Delivery Method: Circle system utilized Preoxygenation: Pre-oxygenation with 100% oxygen Induction Type: IV induction Ventilation: Two handed mask ventilation required and Oral airway inserted - appropriate to patient size Laryngoscope Size: Mac and 4 Grade View: Grade III Tube type: Oral Tube size: 7.5 mm Number of attempts: 1 Airway Equipment and Method: Stylet and Oral airway Placement Confirmation: ETT inserted through vocal cords under direct vision, positive ETCO2 and breath sounds checked- equal and bilateral Secured at: 23 cm Tube secured with: Tape Dental Injury: Teeth and Oropharynx as per pre-operative assessment

## 2021-01-20 NOTE — Progress Notes (Signed)
ANTICOAGULATION CONSULT NOTE - Follow Up Consult  Pharmacy Consult for heparin Indication: chest pain/ACS  Allergies  Allergen Reactions   Contrast Media [Iodinated Diagnostic Agents] Nausea Only    NOT AN ALLERGY. ONLY NAUSEA. 12/11/20  Newly reported by [facility] on 12/04/20.    Patient Measurements: Height: '5\' 11"'$  (180.3 cm) Weight: 97.1 kg (214 lb 1.6 oz) IBW/kg (Calculated) : 75.3 Heparin Dosing Weight: 94 kg  Vital Signs: Temp: 98 F (36.7 C) (08/24 0450) Temp Source: Oral (08/24 0450) BP: 138/82 (08/24 0450) Pulse Rate: 70 (08/24 0450)  Labs: Recent Labs    01/18/21 0428 01/19/21 0347 01/19/21 1210 01/20/21 0449  HGB 12.4* 11.3*  --  12.3*  HCT 38.8* 35.3*  --  37.8*  PLT 224 205  --  211  APTT  --  70*  --   --   HEPARINUNFRC  --  0.37 0.47 0.75*  CREATININE  --   --   --  1.03     Estimated Creatinine Clearance: 100.8 mL/min (by C-G formula based on SCr of 1.03 mg/dL).   Assessment: 50 yo male with CP and cardiac CTA with no significant CAD. He is noted with history of PVD and multiple interventions including R AKA. 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   Heparin held 8/22 AM for angiography on Monday and resumed 8 hours after sheath removal. aPTT and Heparin levels began correlating 8/22 and 8/23, thus aPTT was discontinued.   Heparin level this morning was slightly elevated at 0.75. Patient scheduled for bypass procedure later today. Will decrease by 1 unit/kg/hr to 2000 units/hr now and check heparin level in 6 hours or after procedure.  Goal of Therapy:  Heparin level 0.3-0.7 units/ml Monitor platelets by anticoagulation protocol: Yes   Plan:  - Decrease heparin to 2000 units/hr - Check heparin level in 6 hours OR after procedure - Continue daily heparin level, CBC   Thank you for allowing pharmacy to be a part of this patient's care.  Ardyth Harps, PharmD Clinical Pharmacist

## 2021-01-20 NOTE — Anesthesia Postprocedure Evaluation (Signed)
Anesthesia Post Note  Patient: Birt Jacquiline Doe  Procedure(s) Performed: BYPASS GRAFT FEMORAL-PT ARTERY LEFT USING 6 MM X 80 CM GORE PROPATEN VASCULAR GRAFT REMOVABLE RING (Left)     Patient location during evaluation: PACU Anesthesia Type: General Level of consciousness: awake and alert Pain management: pain level controlled Vital Signs Assessment: post-procedure vital signs reviewed and stable Respiratory status: spontaneous breathing, nonlabored ventilation and respiratory function stable Cardiovascular status: blood pressure returned to baseline and stable Postop Assessment: no apparent nausea or vomiting Anesthetic complications: no   No notable events documented.  Last Vitals:  Vitals:   01/20/21 2000 01/20/21 2024  BP: (!) 149/83 (!) 144/68  Pulse: 95 93  Resp: (!) 22 14  Temp: 36.9 C 36.9 C  SpO2: 100% 96%    Last Pain:  Vitals:   01/20/21 2024  TempSrc: Oral  PainSc:                  Storm Dulski,W. EDMOND

## 2021-01-20 NOTE — Op Note (Signed)
Patient name: Jose Hansen MRN: [id number] DOB: [birth year] Sex: male  01/20/2021 Pre-operative Diagnosis: Chronic limb threatening ischemia with left lower extremity rest pain Post-operative diagnosis:  Same Surgeon:  Eda Paschal. Donzetta Matters, MD Assistants: Annamarie Major, MD; Paulo Fruit, [place] Procedure Performed: 1.  Exposure left common femoral artery and left posterior tibial artery greater than 30 days 2.  Left common femoral to posterior tibial artery bypass with 6 mm ringed PTFE  Indications: 50 year old male with history of right above-knee amputation.  He has previous left greater saphenous vein harvest for the right lower extremity and the wound failed to heal required healing by secondary intention.  He also has left common iliac artery and aortic stenting.  He has recent undergone angiography which demonstrates occlusion of his SFA with popliteal island on the left and reconstitution of a large posterior tibial artery.  He is now indicated for bypass due to the high risk of amputation.  Assistants were necessary to facilitate exposure and expedite the case  Findings: There was dense scar tissue both in the groin and below the knee.  Common femoral artery was heavily scarred and we were able to dissected free of medial circumflex branch as well as the profunda.  The SFA did have minimal trickle backbleeding.  The artery was thickened some intimal was removed but no frank endarterectomy was performed.  Distally the posterior tibial artery was approximately 3 mm actually dilated to 3-1/2 mm.  I could not find any suitable vein in his leg using ultrasound for patch angioplasty and so the graft was sewn directly to the artery.  At completion there was a strong posterior tibial signal at the ankle that was graft dependent.   Procedure:  The patient was identified in the holding area and taken to the operating was put supine operative when general anesthesia was induced.  He was sterilely prepped  draped in left lower extremity usual fashion, antibiotics were ministered timeout was called.  We began using ultrasound to search for saphenous vein both greater and small saphenous vein his left lower extremity none could be identified there was suitable for patch angioplasty.  A vertical incision was then made in the left groin at the site of his previous incision.  We dissected down we then done encountered dense scar tissue.  I did have to use ultrasound to identify where the common femoral artery plane was.  We were then able to dissected and found the SFA Vesseloops placed around this.  We identified a profunda and a large medial branch which were also isolated with Vesseloops.  We dissected up onto the inguinal ligament placed a vessel loop around the external iliac artery.  Attention was turned distally.  The previous large scar tissue was opened for approximately 15 cm below the knee.  There was dense scar tissue there.  Multiple veins were injured were repaired with Prolene suture.  We use Doppler identified a posterior tibial artery.  The tibial nerve was identified and isolated.  Identified posterior tibial arteries and veins did have to divide one of the posterior tibial veins and tied this off.  Posterior tibial artery was isolated and Vesseloops placed proximally and distally around it.  A tunneler was placed this was somewhat difficult due to the scar tissue and we tunneled a 6 mm ringed PTFE graft.  Patient was fully heparinized.  We clamped the profunda and SFA as well as the medial branch followed by the external neck artery.  The vessel  was opened longitudinally.  There was intimal tissue that was removed.  It appeared that there was a previous stitch in the artery or a small patch this may have been from endovascular intervention.  We trimmed the graft to size and sewn into side with 5-0 Prolene suture.  Upon completion we then flushed all vessels through the graft.  The graft was then flushed  with heparinized saline and clamped.  Below the knee we pulled the graft to size.  We clamped the posterior tibial proximally distally opened longitudinally.  I was able to dilate distally up to 3-1/2 mm.  There was good backbleeding distally.  The graft was straightened and trimmed to size and sewn end-to-side with 6-0 Prolene suture.  Prior completion of flushing all directions.  Upon ablation there was very good pulsatility in the posterior tibial artery distally.  There was a good signal at the ankle that was graft dependent.  25 mg of protamine was administered we obtain hemostasis meticulously in the wounds.  In the groin we irrigated and closed in layers of Vicryl and Monocryl at the skin level.  Below the knee there was dense scar tissue I placed interrupted 2-0 Vicryl sutures to get coverage over the graft.  Staples were then placed at the skin level.  Dermabond is placed at the groin skin.  He was then awakened from anesthesia having tolerated procedure well without immediate complication.  All counts were correct at completion.  EBL: 200 cc    Yanely Mast C. Donzetta Matters, MD [facility] [place] Office: [phone] Pager: [phone]

## 2021-01-20 NOTE — Progress Notes (Addendum)
ANTICOAGULATION CONSULT NOTE  Pharmacy Consult for heparin Indication: chest pain/ACS  Allergies  Allergen Reactions   Contrast Media [Iodinated Diagnostic Agents] Nausea Only    NOT AN ALLERGY. ONLY NAUSEA. 12/11/20  Newly reported by [facility] on 12/04/20.    Patient Measurements: Height: '5\' 11"'$  (180.3 cm) Weight: 97.1 kg (214 lb 1.6 oz) IBW/kg (Calculated) : 75.3 Heparin Dosing Weight: 94 kg  Vital Signs: Temp: 98.4 F (36.9 C) (08/24 2024) Temp Source: Oral (08/24 2024) BP: 144/68 (08/24 2024) Pulse Rate: 93 (08/24 2024)  Labs: Recent Labs    01/18/21 0428 01/18/21 0428 01/19/21 0347 01/19/21 1210 01/20/21 0449 01/20/21 1152  HGB 12.4*  --  11.3*  --  12.3*  --   HCT 38.8*  --  35.3*  --  37.8*  --   PLT 224  --  205  --  211  --   APTT  --   --  70*  --   --   --   HEPARINUNFRC  --    < > 0.37 0.47 0.75* 0.61  CREATININE  --   --   --   --  1.03  --    < > = values in this interval not displayed.     Estimated Creatinine Clearance: 100.8 mL/min (by C-G formula based on SCr of 1.03 mg/dL).   Assessment: 50 yo male with CP and cardiac CTA with no significant CAD. He is noted with history of PVD and multiple interventions including R AKA. 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  Patient was transitioned to IV heparin.  He is s/p fem-pop bypass today 8/24 and heparin to resume in AM per MD.  No complications noted.  Goal of Therapy:  Heparin level 0.3-0.7 units/ml Monitor platelets by anticoagulation protocol: Yes   Plan:  On 8/25 at 0700, resume heparin infusion at 2000 units/hr Check 6 hr heparin level Monitor for s/sx of bleeding  Carder Yin D. Mina Marble, PharmD, BCPS, [place] 01/20/2021, 9:20 PM

## 2021-01-20 NOTE — Plan of Care (Signed)

## 2021-01-20 NOTE — Anesthesia Preprocedure Evaluation (Addendum)
Anesthesia Evaluation  Patient identified by MRN, date of birth, ID band Patient awake    Reviewed: Allergy & Precautions, NPO status , Patient's Chart, lab work & pertinent test results  History of Anesthesia Complications Negative for: history of anesthetic complications  Airway Mallampati: II  TM Distance: >3 FB Neck ROM: Full    Dental  (+) Teeth Intact, Dental Advisory Given   Pulmonary former smoker,    breath sounds clear to auscultation       Cardiovascular (-) angina+ Peripheral Vascular Disease and + DVT   Rhythm:Regular Rate:Normal  01/13/2021 ECHO: Mild inferior and inferoseptal hypokinesis. EF 55 to 60%. LV has normal function, no significant valvular abnormalities   Neuro/Psych Depression negative neurological ROS     GI/Hepatic Neg liver ROS, GERD  Medicated and Controlled,  Endo/Other  negative endocrine ROS  Renal/GU negative Renal ROS     Musculoskeletal   Abdominal   Peds  Hematology eliquis   Anesthesia Other Findings   Reproductive/Obstetrics                            Anesthesia Physical Anesthesia Plan  ASA: 3  Anesthesia Plan: General   Post-op Pain Management:    Induction: Intravenous  PONV Risk Score and Plan: 2 and Ondansetron, Dexamethasone and Treatment may vary due to age or medical condition  Airway Management Planned: Oral ETT  Additional Equipment: None  Intra-op Plan:   Post-operative Plan: Extubation in OR  Informed Consent: I have reviewed the patients History and Physical, chart, labs and discussed the procedure including the risks, benefits and alternatives for the proposed anesthesia with the patient or authorized representative who has indicated his/her understanding and acceptance.     Dental advisory given  Plan Discussed with: CRNA and Surgeon  Anesthesia Plan Comments:        Anesthesia Quick Evaluation

## 2021-01-20 NOTE — Transfer of Care (Signed)
Immediate Anesthesia Transfer of Care Note  Patient: Jose Hansen  Procedure(s) Performed: BYPASS GRAFT FEMORAL-PT ARTERY LEFT USING 6 MM X 80 CM GORE PROPATEN VASCULAR GRAFT REMOVABLE RING (Left)  Patient Location: PACU  Anesthesia Type:General  Level of Consciousness: awake, alert  and oriented  Airway & Oxygen Therapy: Patient Spontanous Breathing and Patient connected to nasal cannula oxygen  Post-op Assessment: Report given to RN and Post -op Vital signs reviewed and stable  Post vital signs: Reviewed and stable  Last Vitals:  Vitals Value Taken Time  BP 130/83 01/20/21 1926  Temp    Pulse 94 01/20/21 1929  Resp 19 01/20/21 1929  SpO2 100 % 01/20/21 1929  Vitals shown include unvalidated device data.  Last Pain:  Vitals:   01/20/21 1513  TempSrc:   PainSc: 0-No pain      Patients Stated Pain Goal: 0 ([phone] [id number])  Complications: No notable events documented.

## 2021-01-20 NOTE — Progress Notes (Signed)
ANTICOAGULATION CONSULT NOTE - Follow Up Consult  Pharmacy Consult for heparin Indication: chest pain/ACS  Allergies  Allergen Reactions   Contrast Media [Iodinated Diagnostic Agents] Nausea Only    NOT AN ALLERGY. ONLY NAUSEA. 12/11/20  Newly reported by [facility] on 12/04/20.    Patient Measurements: Height: '5\' 11"'$  (180.3 cm) Weight: 97.1 kg (214 lb 1.6 oz) IBW/kg (Calculated) : 75.3 Heparin Dosing Weight: 94 kg  Vital Signs: Temp: 98.4 F (36.9 C) (08/24 1501) Temp Source: Oral (08/24 1501) BP: 163/87 (08/24 1501) Pulse Rate: 75 (08/24 1501)  Labs: Recent Labs    01/18/21 0428 01/18/21 0428 01/19/21 0347 01/19/21 1210 01/20/21 0449 01/20/21 1152  HGB 12.4*  --  11.3*  --  12.3*  --   HCT 38.8*  --  35.3*  --  37.8*  --   PLT 224  --  205  --  211  --   APTT  --   --  70*  --   --   --   HEPARINUNFRC  --    < > 0.37 0.47 0.75* 0.61  CREATININE  --   --   --   --  1.03  --    < > = values in this interval not displayed.     Estimated Creatinine Clearance: 100.8 mL/min (by C-G formula based on SCr of 1.03 mg/dL).   Assessment: 50 yo male with CP and cardiac CTA with no significant CAD. He is noted with history of PVD and multiple interventions including R AKA. 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   Heparin held 8/22 AM for angiography on Monday and resumed 8 hours after sheath removal. aPTT and Heparin levels began correlating 8/22 and 8/23, thus aPTT was discontinued.   Heparin level this morning was slightly elevated at 0.75. Patient scheduled for bypass procedure later today. Decreased this morning by 1 unit/kg/hr to 2000 units/hr. Heparin level @ 1200 now in therapeutic range with 0.61.   Goal of Therapy:  Heparin level 0.3-0.7 units/ml Monitor platelets by anticoagulation protocol: Yes   Plan:  - Continue heparin @ 2000 units/hr until procedure - Check heparin level in 6 hours OR after procedure -  Continue daily heparin level, CBC   Thank you for allowing pharmacy to be a part of this patient's care.  Ardyth Harps, PharmD Clinical Pharmacist

## 2021-01-21 ENCOUNTER — Encounter: Payer: Medicare Other | Admitting: Internal Medicine

## 2021-01-21 DIAGNOSIS — I739 Peripheral vascular disease, unspecified: Secondary | ICD-10-CM | POA: Diagnosis not present

## 2021-01-21 LAB — LIPID PANEL
Cholesterol: 173 mg/dL (ref 0–200)
HDL: 37 mg/dL — ABNORMAL LOW (ref >40)
LDL Cholesterol: 101 mg/dL — ABNORMAL HIGH (ref 0–99)
Total CHOL/HDL Ratio: 4.7 RATIO
Triglycerides: 173 mg/dL — ABNORMAL HIGH (ref <150)
VLDL: 35 mg/dL (ref 0–40)

## 2021-01-21 LAB — CBC
HCT: 36.2 % — ABNORMAL LOW (ref 39.0–52.0)
Hemoglobin: 11.8 g/dL — ABNORMAL LOW (ref 13.0–17.0)
MCH: 27.1 pg (ref 26.0–34.0)
MCHC: 32.6 g/dL (ref 30.0–36.0)
MCV: 83 fL (ref 80.0–100.0)
Platelets: 200 10*3/uL (ref 150–400)
RBC: 4.36 MIL/uL (ref 4.22–5.81)
RDW: 13.2 % (ref 11.5–15.5)
WBC: 13 10*3/uL — ABNORMAL HIGH (ref 4.0–10.5)
nRBC: 0 % (ref 0.0–0.2)

## 2021-01-21 LAB — COMPREHENSIVE METABOLIC PANEL
ALT: 138 U/L — ABNORMAL HIGH (ref 0–44)
AST: 105 U/L — ABNORMAL HIGH (ref 15–41)
Albumin: 3.8 g/dL (ref 3.5–5.0)
Alkaline Phosphatase: 61 U/L (ref 38–126)
Anion gap: 10 (ref 5–15)
BUN: 14 mg/dL (ref 6–20)
CO2: 21 mmol/L — ABNORMAL LOW (ref 22–32)
Calcium: 9 mg/dL (ref 8.9–10.3)
Chloride: 102 mmol/L (ref 98–111)
Creatinine, Ser: 1.2 mg/dL (ref 0.61–1.24)
GFR, Estimated: >60 mL/min (ref >60)
Glucose, Bld: 170 mg/dL — ABNORMAL HIGH (ref 70–99)
Potassium: 5.5 mmol/L — ABNORMAL HIGH (ref 3.5–5.1)
Sodium: 133 mmol/L — ABNORMAL LOW (ref 135–145)
Total Bilirubin: 0.6 mg/dL (ref 0.3–1.2)
Total Protein: 6.6 g/dL (ref 6.5–8.1)

## 2021-01-21 LAB — HEPARIN LEVEL (UNFRACTIONATED): Heparin Unfractionated: 0.52 IU/mL (ref 0.30–0.70)

## 2021-01-21 NOTE — Progress Notes (Signed)
PHARMACIST LIPID MONITORING   Jose Hansen is a 50 y.o. male POD#1 peripheral bypass surgery.  Pharmacy has been consulted to optimize lipid-lowering therapy with the indication of secondary prevention for clinical ASCVD.  Recent Labs:  Lipid Panel (last 6 months):   Lab Results  Component Value Date   CHOL 173 01/21/2021   TRIG 173 (H) 01/21/2021   HDL 37 (L) 01/21/2021   CHOLHDL 4.7 01/21/2021   VLDL 35 01/21/2021   LDLCALC 101 (H) 01/21/2021    Hepatic function panel (last 6 months):   Lab Results  Component Value Date   AST 105 (H) 01/21/2021   ALT 138 (H) 01/21/2021   ALKPHOS 61 01/21/2021   BILITOT 0.6 01/21/2021    SCr (since admission):   Serum creatinine: 1.2 mg/dL 01/21/21 0717 Estimated creatinine clearance: 86.5 mL/min  Current therapy and lipid therapy tolerance Current lipid-lowering therapy: Atorvastatin 80 mg daily Previous lipid-lowering therapies (if applicable): Atorvastatin 40 mg daily  Documented or reported allergies or intolerances to lipid-lowering therapies (if applicable): none  Assessment:   Atrovastatin was increased from 40 to 80 mg daily 12/03/20  - LDL 172 on 6/29, down to 101 today Transaminases normal on 01/12/21, elevated post-op.   Plan:   Statin intensity (high intensity recommended for all patients regardless of the LDL):  - Will hold Atorvastatin for now, with elevated transaminases. - Recheck LFTs on 8/29 - Resume Atrovastatin 80 mg when LFTs normalize.   Arty Baumgartner, [place] 01/21/2021, 2:44 PM

## 2021-01-21 NOTE — Progress Notes (Addendum)
  Progress Note    01/21/2021 7:52 AM 1 Day Post-Op  Subjective:  left groin and leg tender   Vitals:   01/21/21 0300 01/21/21 0426  BP: 132/76 (!) 125/96  Pulse:  97  Resp: 20 18  Temp:  98.6 F (37 C)  SpO2:  100%   Physical Exam: Cardiac:  regular Lungs:  non labored Incisions: left groin incision is clean, dry and intact. No swelling or hematoma. Possible fullness in proximal left thigh, soft, no ecchymosis. Left popliteal incision is intact with staples. Some bloody oozing. Dry dressings in place Extremities:  well perfused and warm. Brisk doppler PT and Peroneal signals Neurologic: alert and oriented  CBC    Component Value Date/Time   WBC 8.5 01/20/2021 0449   RBC 4.53 01/20/2021 0449   HGB 12.3 (L) 01/20/2021 0449   HGB 15.4 12/03/2020 1202   HCT 37.8 (L) 01/20/2021 0449   HCT 47.0 12/03/2020 1202   PLT 211 01/20/2021 0449   PLT 288 12/03/2020 1202   MCV 83.4 01/20/2021 0449   MCV 81 12/03/2020 1202   MCH 27.2 01/20/2021 0449   MCHC 32.5 01/20/2021 0449   RDW 13.2 01/20/2021 0449   RDW 13.5 12/03/2020 1202   LYMPHSABS 3.5 01/12/2021 2145   LYMPHSABS 3.0 12/03/2020 1202   MONOABS 0.8 01/12/2021 2145   EOSABS 0.3 01/12/2021 2145   EOSABS 0.4 12/03/2020 1202   BASOSABS 0.1 01/12/2021 2145   BASOSABS 0.1 12/03/2020 1202    BMET    Component Value Date/Time   NA 137 01/20/2021 0449   NA 145 (H) 12/03/2020 1202   K 4.2 01/20/2021 0449   CL 105 01/20/2021 0449   CO2 24 01/20/2021 0449   GLUCOSE 109 (H) 01/20/2021 0449   BUN 9 01/20/2021 0449   BUN 11 12/03/2020 1202   CREATININE 1.03 01/20/2021 0449   CALCIUM 9.1 01/20/2021 0449   GFRNONAA >60 01/20/2021 0449    INR    Component Value Date/Time   INR 1.0 01/12/2021 2145     Intake/Output Summary (Last 24 hours) at 01/21/2021 0752 Last data filed at 01/21/2021 0400 Gross per 24 hour  Intake 2059 ml  Output 3900 ml  Net -1841 ml     Assessment/Plan:  50 y.o. male is s/p exposure left  common femoral artery and left posterior tibial artery, left common femoral to posterior tibial artery bypass with PTFE 1 Day Post-Op   Patent left lower extremity bypass Doppler left Pt/ Pero signals. Foot is warm and well perfused Incisions are intact. Left proximal medial thigh fullness but soft VSS Pending morning labs Continue Heparin PT/ OT to evaluate tomorrow  Karoline Caldwell, PA-C [facility] [phone] 01/21/2021 7:52 AM  I have independently interviewed and examined patient and agree with PA assessment and plan above. Palpable PT. Oob with PT.  Shadie Sweatman C. Donzetta Matters, MD [facility] [place] Office: [phone] Pager: [phone]

## 2021-01-21 NOTE — Progress Notes (Signed)
Pt arrived to the floor alert and oriented via the bed. VSS, Cardiac monitoring initiated, CCMD notified. Pt oriented to staff, room and equipment0. We'll continue to monitor.

## 2021-01-21 NOTE — Significant Event (Signed)
Rapid Response Event Note   Reason for Call :  Bleeding surgical site.  Pt is s/p fem>tib bypass. Bleeding was noted to L tibial site as well as swelling to L groin site after pt returned to bed after getting up.   Initial Focused Assessment:  On arrival, patient is alert and oriented, laying in bed complaining of pain at groin and leg surgical sites. RN applied new dressing to leg wound and dressing is currently CDI- does not appear to be actively bleeding anymore. Groin site has soft hematoma distal to skin glue at groin, on inner thigh. It is tender to touch. Margins have been marked prior to RRT arrival.   VS: HR 113, BP 132/76, RR 27, Sats 100% on RA.   Bedside RN Notified VVS PTA RRT.  Interventions:  Obtained sand bag from OR to apply to patients groin.   Plan of Care:  Apply sand bag to groin per Dr. Stephens Shire orders.  Monitor BP CBC in AM Maintain bedrest until MD can come assess patient in AM.  Please call RRT if further assistance is needed.  Event Summary:   MD Notified: MD notified by RN PTA RRT Call Time: 0240 Arrival Time: [id number] End Time: 0300  Monna Fam, RN

## 2021-01-21 NOTE — Progress Notes (Signed)
Mobility Specialist: Progress Note   01/21/21 1435  Mobility  Activity Ambulated in room  Level of Assistance Minimal assist, patient does 75% or more  Assistive Device Crutches  Distance Ambulated (ft) 40 ft  Mobility Ambulated with assistance in room  Mobility Response Tolerated well  Mobility performed by Mobility specialist  Bed Position Chair  $Mobility charge 1 Mobility   Pre-Mobility: 92 HR, 131/87 BP, 100% SpO2 Post-Mobility: 102 HR, 120/79 BP, 97% SpO2  Pt independent with bed mobility and required minA to stand from EOB, two attempts to stand. Pt attempted to stand independently with crutches but was unable. MinA during second attempt to stand and contact guard during ambulation with c/o 10/10 pain. Pt to the bed and then performed lateral scoot to the recliner. Pt has call bell and phone in reach. Minimal bleeding noted from LLE, RN notified.   [facility] Renlee Floor Mobility Specialist Mobility Specialist Phone: [phone]

## 2021-01-21 NOTE — Progress Notes (Signed)
Mobility Specialist: Progress Note   01/21/21 1107  Mobility  Activity  (Cancel)   Session cancelled d/t bleeding at groin site over night. Will f/u tomorrow.   [facility] Sarh Kirschenbaum Mobility Specialist Mobility Specialist Phone: [phone]

## 2021-01-21 NOTE — Progress Notes (Addendum)
OT Cancellation Note  Patient Details Name: Jose Hansen MRN: [id number] DOB: 10-21-1970   Cancelled Treatment:    Reason Eval/Treat Not Completed: Patient not medically ready;Other (comment) Pt noted with bleeding from surgical site overnight. Per RR note, bedrest recommended until MD can assess in AM. Will hold OOB activities until cleared by MD.   Addendum @ 7:40AM: PT spoke with PA who recommends holding therapy evals until tomorrow due to events overnight.  Layla Maw 01/21/2021, 6:54 AM

## 2021-01-21 NOTE — Progress Notes (Signed)
Pt asked to get up and use the bathroom, after using the commode, the tibial incision started oozing and the left upper tight at the incision site started swelling. Dr. Trula Slade  notified, order to reinforce the dressing and put a sandbag on the groin area received.  The RRN got [place] the sandbag and came assessed the patient. We'll continue to monitor.

## 2021-01-21 NOTE — Progress Notes (Deleted)
Hospital d/c 08/16 PVD Gabapentin 300 mg TID Hx of DVT Eliquis 5 mg BID Hx of AKA Depression Duloxetine 60 mg History of auditory hallucinations Seroquel 50 TID--> change dosage if possible to decrease pill burden HLD Atorvastatin 80 mg HTN

## 2021-01-21 NOTE — Progress Notes (Signed)
HD#7 SUBJECTIVE:  Patient Summary: Jose Hansen is a 50 y.o. with a pertinent PMH of severe PVD s/p multiple vascular interventions, right AKA, left femoral and popliteal artery occlusions, and recent right femoral DVT who was admitted 01/12/21 for further vascular workup of left lower extremity ischemia. Artery bypass on 01/20/21.  Overnight Events: oozing of tibial incision and left upper thigh started swelling after using commode    Interm History: Patient seen and evaluated at bedside. He is alert and eating breakfast. Bleeding has begun to improve, but still oozing. Rates his pain 14/10. No other complaints at this time.   OBJECTIVE:  Vital Signs: Vitals:   01/20/21 2024 01/21/21 0005 01/21/21 0300 01/21/21 0426  BP: (!) 144/68 (!) 152/79 132/76 (!) 125/96  Pulse: 93 (!) 107  97  Resp: 14 (!) '21 20 18  '$ Temp: 98.4 F (36.9 C) 98.3 F (36.8 C)  98.6 F (37 C)  TempSrc: Oral Oral  Oral  SpO2: 96% 100%  100%  Weight:      Height:       Supplemental O2: Room Air SpO2: 100 % O2 Flow Rate (L/min): 2 L/min  Filed Weights   01/17/21 0442 01/18/21 0544 01/19/21 0056  Weight: 98.9 kg 96.9 kg 97.1 kg     Intake/Output Summary (Last 24 hours) at 01/21/2021 0617 Last data filed at 01/21/2021 0400 Gross per 24 hour  Intake 3299.94 ml  Output 3900 ml  Net -600.06 ml   Net IO Since Admission: 1,867.86 mL [01/21/21 0617]  Physical Exam: Physical Exam Constitutional:      Appearance: He is well-developed and normal weight.  HENT:     Head: Normocephalic and atraumatic.  Cardiovascular:     Rate and Rhythm: Normal rate and regular rhythm.     Heart sounds: Normal heart sounds.  Pulmonary:     Effort: Pulmonary effort is normal.     Breath sounds: Normal breath sounds.  Abdominal:     General: Bowel sounds are normal.     Palpations: Abdomen is soft.  Musculoskeletal:     Comments: RLL amputation.  LLE with surgical scar medial plane. Oozing of blood. No  surrounding edema or erythema. No signs of infection. Wound edges approximated. Tender area or edema in patient's left ground . Soft to touch.    Skin:    General: Skin is warm and dry.  Neurological:     General: No focal deficit present.     Mental Status: He is alert and oriented to person, place, and time.  Psychiatric:        Mood and Affect: Mood normal.        Behavior: Behavior normal.    Patient Lines/Drains/Airways Status     Active Line/Drains/Airways     Name Placement date Placement time Site Days   Peripheral IV 01/18/21 22 G 1" Anterior;Left;Proximal Forearm 01/18/21  2011  Forearm  3   Peripheral IV 01/20/21 18 G Left;Posterior Wrist 01/20/21  1514  Wrist  1   Peripheral IV 01/20/21 18 G Right Hand 01/20/21  1605  Hand  1   Urethral Catheter 01/20/21  1700  --  1   Incision (Closed) 01/20/21 Groin Left 01/20/21  1907  -- 1   Incision (Closed) 01/20/21 Leg Left 01/20/21  1907  -- 1            Pertinent Labs: CBC Latest Ref Rng & Units 01/20/2021 01/19/2021 01/18/2021  WBC 4.0 - 10.5 K/uL 8.5 9.0  8.9  Hemoglobin 13.0 - 17.0 g/dL 12.3(L) 11.3(L) 12.4(L)  Hematocrit 39.0 - 52.0 % 37.8(L) 35.3(L) 38.8(L)  Platelets 150 - 400 K/uL 211 205 224    CMP Latest Ref Rng & Units 01/20/2021 01/17/2021 01/12/2021  Glucose 70 - 99 mg/dL 109(H) 264(H) 97  BUN 6 - 20 mg/dL '9 10 13  '$ Creatinine 0.61 - 1.24 mg/dL 1.03 0.98 1.10  Sodium 135 - 145 mmol/L 137 135 143  Potassium 3.5 - 5.1 mmol/L 4.2 4.2 3.9  Chloride 98 - 111 mmol/L 105 103 107  CO2 22 - 32 mmol/L 24 25 -  Calcium 8.9 - 10.3 mg/dL 9.1 9.1 -  Total Protein 6.5 - 8.1 g/dL - - -  Total Bilirubin 0.3 - 1.2 mg/dL - - -  Alkaline Phos 38 - 126 U/L - - -  AST 15 - 41 U/L - - -  ALT 0 - 44 U/L - - -    Recent Labs    01/18/21 1213  GLUCAP 101*     Pertinent Imaging: No results found.  ASSESSMENT/PLAN:  Assessment: Principal Problem:   PVD (peripheral vascular disease) (HCC) Active Problems:    Hyperlipidemia   Prediabetes   Atypical chest pain   Hx of AKA (above knee amputation), right (HCC)   Ischemia of extremity   Jose Hansen is a 50 y.o. with pertinent PMH of severe PVD s/p multiple vascular interventions, right AKA, left femoral and popliteal artery occlusions, and recent right femoral DVT who was admitted 01/12/21 for further vascular workup of left lower extremity ischemia. Artery bypass on 01/20/21.on hospital day 7  Plan: #critical limb ischemia of the left lower extremity secondary to PAD -Management per VVS w/ bypass 8/24 -Patient is able to move limb today. Circulation adequate with good capillary refill. No pallor. Posterior tibial pulse intact. Dorsalis pedis diminished. Sensation intact.  -Oozing after attempting to ambulate last night. Heparin per pharm. Continue to monitor for ongoing oozing.  -Patient continues to endorse pain. Will need to start transitioning off IV. - prn oxycodone, prn dilaudid - lipitor '80mg'$  daily - aspirin 81   #GERD Pepcid '20mg'$  daily - maloox/mylanta prn  Chronic depression - cymbalta and seroquel  Right common femoral DVT - heparin restarted.  Chest pain - resolved  Best Practice: DVT prophx: Heparin Code:Full Prior to Admission Living Arrangement:home Anticipated Discharge Location: Home Barriers to Discharge: vascular workup Dispo: Anticipated discharge in approximately 2 day(s).  Signature: Delene Ruffini, MD  Internal Medicine Resident, PGY-1 [facility]  Pager: [phone] 6:17 AM, 01/21/2021   Please contact the on call pager after 5 pm and on weekends at [phone].

## 2021-01-21 NOTE — Progress Notes (Signed)
PT Cancellation Note  Patient Details Name: Jose Hansen MRN: [id number] DOB: July 12, 1970   Cancelled Treatment:    Reason Eval/Treat Not Completed: Patient not medically ready (discussed with PA bleeding from groin and will hold til next date per her request)   Ariana Cavenaugh B Kariyah Baugh 01/21/2021, 7:27 AM Bayard Males, PT Acute Rehabilitation Services Pager: [phone] Office: [phone]

## 2021-01-21 NOTE — Progress Notes (Signed)
Dressing to LLE changed for third time today.  Moderate amount of serosangenous drainage.  Dr. Stanford Breed on floor and comfortable with continuing to monitor and changing dressing prn throughout the evening.  Call if frank bleeding.

## 2021-01-21 NOTE — Progress Notes (Signed)
ANTICOAGULATION CONSULT NOTE  Pharmacy Consult for heparin Indication: chest pain/ACS  Allergies  Allergen Reactions   Contrast Media [Iodinated Diagnostic Agents] Nausea Only    NOT AN ALLERGY. ONLY NAUSEA. 12/11/20  Newly reported by [facility] on 12/04/20.    Patient Measurements: Height: '5\' 11"'$  (180.3 cm) Weight: 97.1 kg (214 lb 1.6 oz) IBW/kg (Calculated) : 75.3 Heparin Dosing Weight: 94 kg  Vital Signs: Temp: 97.8 F (36.6 C) (08/25 1725) Temp Source: Oral (08/25 1725) BP: 144/90 (08/25 1725) Pulse Rate: 91 (08/25 1133)  Labs: Recent Labs    01/19/21 0347 01/19/21 1210 01/20/21 0449 01/20/21 1152 01/21/21 0717 01/21/21 1511  HGB 11.3*  --  12.3*  --  11.8*  --   HCT 35.3*  --  37.8*  --  36.2*  --   PLT 205  --  211  --  200  --   APTT 70*  --   --   --   --   --   HEPARINUNFRC 0.37   < > 0.75* 0.61  --  0.52  CREATININE  --   --  1.03  --  1.20  --    < > = values in this interval not displayed.     Estimated Creatinine Clearance: 86.5 mL/min (by C-G formula based on SCr of 1.2 mg/dL).   Assessment: 50 yo male with CP and cardiac CTA with no significant CAD. He is noted with history of PVD and multiple interventions including R AKA. 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  Patient was transitioned to IV heparin.  He is s/p fem-pop bypass today 8/24 and heparin to resume in AM per MD.  No complications noted.  Per RN - some oozing throughout the day from leg incision.  Dr. Stanford Breed has seen it.  Heparin level this evening within goal range, no other overt bleeding or complications noted.  Goal of Therapy:  Heparin level 0.3-0.7 units/ml Monitor platelets by anticoagulation protocol: Yes   Plan:  Continue IV heparin at current rate of 2000 units/hr. Repeat heparin level and CBC in AM.  [place] Crane, Vena Austria, BCPS, [facility] Clinical Pharmacist  01/21/2021 6:01 PM   [facility] pharmacy phone numbers are listed on  [email or web address]

## 2021-01-22 ENCOUNTER — Encounter (HOSPITAL_COMMUNITY): Payer: Self-pay | Admitting: Vascular Surgery

## 2021-01-22 DIAGNOSIS — I739 Peripheral vascular disease, unspecified: Secondary | ICD-10-CM | POA: Diagnosis not present

## 2021-01-22 LAB — CBC
HCT: 34.6 % — ABNORMAL LOW (ref 39.0–52.0)
Hemoglobin: 11 g/dL — ABNORMAL LOW (ref 13.0–17.0)
MCH: 26.9 pg (ref 26.0–34.0)
MCHC: 31.8 g/dL (ref 30.0–36.0)
MCV: 84.6 fL (ref 80.0–100.0)
Platelets: 184 10*3/uL (ref 150–400)
RBC: 4.09 MIL/uL — ABNORMAL LOW (ref 4.22–5.81)
RDW: 13.6 % (ref 11.5–15.5)
WBC: 12.7 10*3/uL — ABNORMAL HIGH (ref 4.0–10.5)
nRBC: 0 % (ref 0.0–0.2)

## 2021-01-22 LAB — BASIC METABOLIC PANEL
Anion gap: 7 (ref 5–15)
BUN: 12 mg/dL (ref 6–20)
CO2: 23 mmol/L (ref 22–32)
Calcium: 8.4 mg/dL — ABNORMAL LOW (ref 8.9–10.3)
Chloride: 104 mmol/L (ref 98–111)
Creatinine, Ser: 1.1 mg/dL (ref 0.61–1.24)
GFR, Estimated: >60 mL/min (ref >60)
Glucose, Bld: 163 mg/dL — ABNORMAL HIGH (ref 70–99)
Potassium: 4.1 mmol/L (ref 3.5–5.1)
Sodium: 134 mmol/L — ABNORMAL LOW (ref 135–145)

## 2021-01-22 LAB — HEPARIN LEVEL (UNFRACTIONATED): Heparin Unfractionated: 0.3 IU/mL (ref 0.30–0.70)

## 2021-01-22 NOTE — Progress Notes (Signed)
ANTICOAGULATION CONSULT NOTE - Follow Up Consult  Pharmacy Consult for Heparin Indication:  hx recurrent DVT/PE/LV thrombus  Allergies  Allergen Reactions   Contrast Media [Iodinated Diagnostic Agents] Nausea Only    NOT AN ALLERGY. ONLY NAUSEA. 12/11/20  Newly reported by [facility] on 12/04/20.    Patient Measurements: Height: '5\' 11"'$  (180.3 cm) Weight: 97.1 kg (214 lb 1.6 oz) IBW/kg (Calculated) : 75.3 Heparin Dosing Weight: 94 kg  Vital Signs: Temp: 97.7 F (36.5 C) (08/26 0515) Temp Source: Oral (08/26 0833) BP: 114/64 (08/26 0833) Pulse Rate: 97 (08/26 0833)  Labs: Recent Labs    01/20/21 0449 01/20/21 1152 01/21/21 0717 01/21/21 1511 01/22/21 0254  HGB 12.3*  --  11.8*  --  11.0*  HCT 37.8*  --  36.2*  --  34.6*  PLT 211  --  200  --  184  HEPARINUNFRC 0.75* 0.61  --  0.52 0.30  CREATININE 1.03  --  1.20  --  1.10    Estimated Creatinine Clearance: 94.4 mL/min (by C-G formula based on SCr of 1.1 mg/dL).  Assessment: 50 yo male with CP and cardiac CTA with no significant CAD. He is noted with history of PVD and multiple interventions including R AKA. 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  Patient was transitioned to IV heparin.   S/p fem-pop bypass 8/24 and heparin resumed on 8/25 am. Heparin level is low therapeutic (0.30) on 2000 units/hr. Noted continued oozing from leg incision.  Dr. Donzetta Matters evaluated this am.    Goal of Therapy:  Heparin level 0.3-0.7 units/ml Monitor platelets by anticoagulation protocol: Yes   Plan:  Continue heparin drip at 2000 units/hr Daily heparin level and CBC. Monitor oozing/ resolution. Noted plan to change back to Eliquis when drainage from leg decreases.  Arty Baumgartner, RPh 01/22/2021,10:22 AM

## 2021-01-22 NOTE — Evaluation (Signed)
Physical Therapy Evaluation Patient Details Name: Jose Hansen MRN: [id number] DOB: 10-16-1970 Today's Date: 01/22/2021   History of Present Illness  50 y.o. male admitted 8/16 with PVD and leg pain. 8/22 abdominal aortogram. 8/24 L bypass graft common femoral to posterior tibial artery. PMH: severe PVD, R AKA, and VTE.  Clinical Impression  Pt tolerated treatment well with VSS throughout. Pt mentioned bleeding at surgical site on LLE prior to movement. Notified staff. Pt ambulated 28' in hall with RW to provide greater stability. Pt moved well, but became fatigued. Pt's deficits include decreased mobility, activity tolerance, and balance. Pt would benefit from continued PT to improve upon deficits and function. Recommend HHPT with supervision for mobility upon d/c, as pt has equipment all necessary equipment at home and an accessible environment. Pt agreeable to d/c recommendation.    Follow Up Recommendations Home health PT;Supervision for mobility/OOB    Equipment Recommendations  Rolling walker with 5" wheels    Recommendations for Other Services       Precautions / Restrictions Precautions Precautions: Fall Precaution Comments: Rt AKa, pt reports several falls in the last year      Mobility  Bed Mobility Overal bed mobility: Needs Assistance Bed Mobility: Supine to Sit     Supine to sit: Supervision     General bed mobility comments: supervision for lines    Transfers Overall transfer level: Needs assistance   Transfers: Sit to/from Stand Sit to Stand: Min assist         General transfer comment: min assist to rise from bed and chair with cues for hand placement and assist to fully rise  Ambulation/Gait Ambulation/Gait assistance: Min guard;+2 safety/equipment Gait Distance (Feet): 28 Feet Assistive device: Rolling walker (2 wheeled) Gait Pattern/deviations: Step-to pattern   Gait velocity interpretation: 1.31 - 2.62 ft/sec, indicative of limited  community ambulator General Gait Details: cues for safety and sequence, limited by fatigue, assist for lines, chair follow. Pt walked 28' then 24' with seated rest between trials  Stairs            Wheelchair Mobility    Modified Rankin (Stroke Patients Only)       Balance Overall balance assessment: Needs assistance   Sitting balance-Leahy Scale: Good     Standing balance support: Bilateral upper extremity supported Standing balance-Leahy Scale: Poor Standing balance comment: bil UE to maintain balance with AKA                             Pertinent Vitals/Pain Pain Assessment: 0-10 Pain Score: 8  Pain Location: LLe Pain Descriptors / Indicators: Aching Pain Intervention(s): Limited activity within patient's tolerance;Monitored during session;Premedicated before session;Repositioned    Home Living Family/patient expects to be discharged to:: Private residence Living Arrangements: Other relatives Available Help at Discharge: Family;Available PRN/intermittently Type of Home: House Home Access: Stairs to enter Entrance Stairs-Rails: None Entrance Stairs-Number of Steps: 2 Home Layout: One level Home Equipment: Wheelchair - manual;Crutches;Shower seat      Prior Function Level of Independence: Independent with assistive device(s)         Comments: pt has been using crutches in the house and WC in the community, able to perform ADLs     Hand Dominance        Extremity/Trunk Assessment   Upper Extremity Assessment Upper Extremity Assessment: Defer to OT evaluation    Lower Extremity Assessment Lower Extremity Assessment: RLE deficits/detail;LLE deficits/detail RLE Deficits / Details: AKA  LLE Deficits / Details: decreased strength and pain with limited hip flexion with groin pain and edema    Cervical / Trunk Assessment Cervical / Trunk Assessment: Normal  Communication   Communication: No difficulties  Cognition Arousal/Alertness:  Awake/alert Behavior During Therapy: WFL for tasks assessed/performed Overall Cognitive Status: Within Functional Limits for tasks assessed                                        General Comments General comments (skin integrity, edema, etc.): pt reports he had a prosthesis but it was stolen about a month ago    Exercises General Exercises - Lower Extremity Long Arc Quad: AROM;Left;Seated;10 reps Hip Flexion/Marching: AROM;Left;Seated;10 reps   Assessment/Plan    PT Assessment Patient needs continued PT services  PT Problem List Decreased strength;Decreased mobility;Decreased safety awareness;Decreased activity tolerance;Decreased balance;Decreased knowledge of use of DME;Pain       PT Treatment Interventions Gait training;Stair training;Functional mobility training;Therapeutic activities;Patient/family education;Balance training;Therapeutic exercise;DME instruction    PT Goals (Current goals can be found in the Care Plan section)  Acute Rehab PT Goals Patient Stated Goal: return home and to dog PT Goal Formulation: With patient Time For Goal Achievement: 02/05/21 Potential to Achieve Goals: Good    Frequency Min 3X/week   Barriers to discharge Decreased caregiver support      Co-evaluation               AM-PAC PT "6 Clicks" Mobility  Outcome Measure Help needed turning from your back to your side while in a flat bed without using bedrails?: None Help needed moving from lying on your back to sitting on the side of a flat bed without using bedrails?: None Help needed moving to and from a bed to a chair (including a wheelchair)?: A Little Help needed standing up from a chair using your arms (e.g., wheelchair or bedside chair)?: A Little Help needed to walk in hospital room?: A Little Help needed climbing 3-5 steps with a railing? : A Lot 6 Click Score: 19    End of Session Equipment Utilized During Treatment: Gait belt Activity Tolerance: Patient  tolerated treatment well Patient left: in chair;with call bell/phone within reach;with chair alarm set Nurse Communication: Mobility status PT Visit Diagnosis: Other abnormalities of gait and mobility (R26.89);Difficulty in walking, not elsewhere classified (R26.2)    Time: [id number] PT Time Calculation (min) (ACUTE ONLY): 27 min   Charges:   PT Evaluation $PT Eval Moderate Complexity: 1 Mod PT Treatments $Gait Training: 8-22 mins        Louie Casa, SPT Acute Rehab: [phone] [id number]     Domingo Dimes 01/22/2021, 8:07 AM

## 2021-01-22 NOTE — Progress Notes (Signed)
  Progress Note    01/22/2021 8:25 AM 2 Days Post-Op  Subjective: Foot feeling better, having continued oozing from medial leg wound  Vitals:   01/22/21 0515 01/22/21 0717  BP: 107/68   Pulse: 92 95  Resp: 16   Temp: 97.7 F (36.5 C)   SpO2: 97%     Physical Exam: Awake alert oriented Dressing on left lower extremity incision Palpable left PT  CBC    Component Value Date/Time   WBC 12.7 (H) 01/22/2021 0254   RBC 4.09 (L) 01/22/2021 0254   HGB 11.0 (L) 01/22/2021 0254   HGB 15.4 12/03/2020 1202   HCT 34.6 (L) 01/22/2021 0254   HCT 47.0 12/03/2020 1202   PLT 184 01/22/2021 0254   PLT 288 12/03/2020 1202   MCV 84.6 01/22/2021 0254   MCV 81 12/03/2020 1202   MCH 26.9 01/22/2021 0254   MCHC 31.8 01/22/2021 0254   RDW 13.6 01/22/2021 0254   RDW 13.5 12/03/2020 1202   LYMPHSABS 3.5 01/12/2021 2145   LYMPHSABS 3.0 12/03/2020 1202   MONOABS 0.8 01/12/2021 2145   EOSABS 0.3 01/12/2021 2145   EOSABS 0.4 12/03/2020 1202   BASOSABS 0.1 01/12/2021 2145   BASOSABS 0.1 12/03/2020 1202    BMET    Component Value Date/Time   NA 134 (L) 01/22/2021 0254   NA 145 (H) 12/03/2020 1202   K 4.1 01/22/2021 0254   CL 104 01/22/2021 0254   CO2 23 01/22/2021 0254   GLUCOSE 163 (H) 01/22/2021 0254   BUN 12 01/22/2021 0254   BUN 11 12/03/2020 1202   CREATININE 1.10 01/22/2021 0254   CALCIUM 8.4 (L) 01/22/2021 0254   GFRNONAA >60 01/22/2021 0254    INR    Component Value Date/Time   INR 1.0 01/12/2021 2145     Intake/Output Summary (Last 24 hours) at 01/22/2021 0825 Last data filed at 01/22/2021 0600 Gross per 24 hour  Intake 1868.68 ml  Output 650 ml  Net 1218.68 ml     Assessment/plan:  50 y.o. male is s/p left femoral to PT bypass complicated by severe scar tissue from previous operations.  He is having oozing from his lower leg incision H&H is relatively stable and he is hemodynamically stable.  We will continue to change dressing as needed.  He is okay for  activity as tolerated.  He is on heparin drip when he transitioned back to home dose Eliquis prior to discharge when drainage from leg incision decreases.   Evoleth Nordmeyer C. Donzetta Matters, MD [facility] Office: [phone] Pager: [phone]  01/22/2021 8:25 AM

## 2021-01-22 NOTE — Progress Notes (Signed)
  Summary: Jose Hansen is a 50 y.o. male with severe PVD s/p multiple vascular interventions, right AKA, left femoral and popliteal artery occlusions, and recent right femoral DVT who was admitted 01/12/21 for further vascular workup of left lower extremity ischemia. Artery bypass on 01/20/21.  Subjective: Patient seen at bedside. He was in the recliner. Declined any chest pain, sob, abdominal pain, or constipation. He was complaining about leg pain which is more located along the area of incision compared to before where it was more in the foot. He seemed to worried about drainage from his wound.   Objective:  Vital signs in last 24 hours: Vitals:   01/21/21 1725 01/21/21 2015 01/22/21 0013 01/22/21 0515  BP: (!) 144/90 127/90 111/61 107/68  Pulse:  85 85 92  Resp: '18 17 15 16  '$ Temp: 97.8 F (36.6 C) 97.7 F (36.5 C) 98.3 F (36.8 C) 97.7 F (36.5 C)  TempSrc: Oral Oral Oral Oral  SpO2: 98% 98% 96% 97%  Weight:      Height:       Physical Exam: General: well appearing, in no acute distress CV: RRR, LLE warm to touch. DP pulses palpated.  Lungs: CTAB, no wheezes, rhonchi, or rales Abdomen: normal bowel sounds, non-tender Skin: No hematoma at left groin. Left leg wrapped below the knee.  Assessment/Plan:  Principal Problem:   PVD (peripheral vascular disease) (HCC) Active Problems:   Hyperlipidemia   Prediabetes   Atypical chest pain   Hx of AKA (above knee amputation), right (HCC)   Ischemia of extremity  Critical limb ischemia of the left lower extremity secondary to PAD Management per VVS Bypass on 08/24 VVS still following to rule out any complications Heparin per pharmay Transition to Eliquis prior to discharge after drainage from leg decreases Pain management: prn oxycodone, prn dilaudid Statin therapy: Lipitor '80mg'$  daily Antiplatelet therapy: Asprin 81 mg  GERD Pepcid '20mg'$  daily Maloox/Mylanta prn  Chronic depression Continue symbalta and  seroquel  Right common femoral DVT Continue heparin gtt by vascular. Transition slowly to Apixaban.   Chest pain--resolved Coronary Calcium score low, no further workup at this time   DVT prophx: Heparin Code:Full  Prior to Admission Living Arrangement:home Anticipated Discharge Location: Home Barriers to Discharge: vascular clearance Dispo: Anticipated discharge in approximately 2 day(s).   [name], MD Internal Medicine Resident PGY-1 [facility] Pager: [phone] 01/22/2021 6:32 AM

## 2021-01-22 NOTE — Evaluation (Signed)
Occupational Therapy Evaluation Patient Details Name: Jose Hansen MRN: [id number] DOB: 13-Apr-1971 Today's Date: 01/22/2021    History of Present Illness 50 y.o. male admitted 8/16 with PVD and leg pain. 8/22 abdominal aortogram. 8/24 L bypass graft common femoral to posterior tibial artery. PMH: severe PVD, R AKA, and VTE.   Clinical Impression   PTA patient reports independent with ADLs, IADLs and mobility using crutches but endorses falls at home.  Admitted for above and limited by problem list below, including impaired balance, L LE pain, decreased activity tolerance. He completes transfers with min assist using RW, requires mod assist for LB ADLs and setup for UB ADLs.  Discussed home setup and reports RW/WC will not fit in bathroom, will have to use crutches; tub bench will not fit in bathroom either for safer tub transfer.  Recommend continued OT services while admitted and after dc at [facility] level to optimize independence, safety and return to PLOF with ADLs and mobility.     Follow Up Recommendations  Home health OT;Supervision - Intermittent    Equipment Recommendations  3 in 1 bedside commode    Recommendations for Other Services       Precautions / Restrictions Precautions Precautions: Fall Precaution Comments: Rt AKa, pt reports several falls in the last year Restrictions Weight Bearing Restrictions: No      Mobility Bed Mobility Overal bed mobility: Needs Assistance Bed Mobility: Supine to Sit     Supine to sit: Supervision     General bed mobility comments: OOB in recliner upon entry    Transfers Overall transfer level: Needs assistance   Transfers: Sit to/from Stand Sit to Stand: Min assist         General transfer comment: min assist to power up and steady from recliner with cueing for hand placment and safety    Balance Overall balance assessment: Needs assistance   Sitting balance-Leahy Scale: Good     Standing balance support: Bilateral  upper extremity supported;During functional activity Standing balance-Leahy Scale: Poor Standing balance comment: bil UE to maintain balance with AKA                           ADL either performed or assessed with clinical judgement   ADL Overall ADL's : Needs assistance/impaired     Grooming: Set up;Sitting           Upper Body Dressing : Set up;Sitting   Lower Body Dressing: Moderate assistance;Sit to/from stand Lower Body Dressing Details (indicate cue type and reason): requires assist to fully manage L sock, min assist sit to stand but fully relies on BUE support; discussed lateral lean technqiues   Toilet Transfer Details (indicate cue type and reason): min assist sit to stand         Functional mobility during ADLs: Minimal assistance;Rolling walker;Cueing for safety General ADL Comments: pt limited by pain, impaired balance     Vision         Perception     Praxis      Pertinent Vitals/Pain Pain Assessment: 0-10 Pain Score: 10-Worst pain ever Pain Location: LLe Pain Descriptors / Indicators: Aching;Stabbing Pain Intervention(s): Limited activity within patient's tolerance;Monitored during session;Repositioned     Hand Dominance Right   Extremity/Trunk Assessment Upper Extremity Assessment Upper Extremity Assessment: Overall WFL for tasks assessed   Lower Extremity Assessment Lower Extremity Assessment: Defer to PT evaluation RLE Deficits / Details: AKA LLE Deficits / Details: decreased strength and pain  with limited hip flexion with groin pain and edema   Cervical / Trunk Assessment Cervical / Trunk Assessment: Normal   Communication Communication Communication: No difficulties   Cognition Arousal/Alertness: Awake/alert Behavior During Therapy: WFL for tasks assessed/performed Overall Cognitive Status: Within Functional Limits for tasks assessed                                     General Comments  pt reports he had  a prosthesis but it was stolen about a month ago    Exercises General Exercises - Lower Extremity Long Arc Quad: AROM;Left;Seated;10 reps Hip Flexion/Marching: AROM;Left;Seated;10 reps   Shoulder Instructions      Home Living Family/patient expects to be discharged to:: Private residence Living Arrangements: Other relatives (brother, sister in law, neice and nephews) Available Help at Discharge: Family;Available PRN/intermittently Type of Home: House Home Access: Stairs to enter [employer] of Steps: 2 Entrance Stairs-Rails: None Home Layout: One level     Bathroom Shower/Tub: Teacher, early years/pre: Standard     Home Equipment: Wheelchair - manual;Crutches;Shower seat   Additional Comments: reports wc or RW will not fit in bathroom, would have to use crutches      Prior Functioning/Environment Level of Independence: Independent with assistive device(s)        Comments: pt has been using crutches in the house and WC in the community, able to perform ADLs; completing ADLs and IADLs independently from crutches (but reports at least10 falls in the last 2 months)        OT Problem List: Decreased strength;Decreased activity tolerance;Impaired balance (sitting and/or standing);Decreased safety awareness;Decreased knowledge of use of DME or AE;Decreased knowledge of precautions;Pain      OT Treatment/Interventions: Self-care/ADL training;DME and/or AE instruction;Therapeutic exercise;Therapeutic activities;Patient/family education;Balance training    OT Goals(Current goals can be found in the care plan section) Acute Rehab OT Goals Patient Stated Goal: return home and to dog OT Goal Formulation: With patient Time For Goal Achievement: 02/05/21 Potential to Achieve Goals: Good  OT Frequency: Min 2X/week   Barriers to D/C:            Co-evaluation              AM-PAC OT "6 Clicks" Daily Activity     Outcome Measure Help from another  person eating meals?: None Help from another person taking care of personal grooming?: A Little Help from another person toileting, which includes using toliet, bedpan, or urinal?: A Lot Help from another person bathing (including washing, rinsing, drying)?: A Lot Help from another person to put on and taking off regular upper body clothing?: None Help from another person to put on and taking off regular lower body clothing?: A Lot 6 Click Score: 17   End of Session Equipment Utilized During Treatment: Gait belt;Rolling walker Nurse Communication: Mobility status  Activity Tolerance: Patient tolerated treatment well Patient left: in chair;with call bell/phone within reach;with chair alarm set  OT Visit Diagnosis: Other abnormalities of gait and mobility (R26.89);Pain;History of falling (Z91.81) Pain - Right/Left: Left Pain - part of body: Leg                Time: [id number] OT Time Calculation (min): 18 min Charges:  OT General Charges $OT Visit: 1 Visit OT Evaluation $OT Eval Moderate Complexity: 1 Mod  Jolaine Artist, OT Acute Rehabilitation Services Pager [phone] Office [phone]   Anner Crete  Syleena Mchan 01/22/2021, 9:26 AM

## 2021-01-22 NOTE — Care Management Important Message (Signed)
Important Message  Patient Details  Name: Jose Hansen MRN: [id number] Date of Birth: August 31, 1970   Medicare Important Message Given:  Yes     [name] 01/22/2021, 10:22 AM

## 2021-01-23 LAB — CBC
HCT: 32.9 % — ABNORMAL LOW (ref 39.0–52.0)
Hemoglobin: 10.2 g/dL — ABNORMAL LOW (ref 13.0–17.0)
MCH: 26.3 pg (ref 26.0–34.0)
MCHC: 31 g/dL (ref 30.0–36.0)
MCV: 84.8 fL (ref 80.0–100.0)
Platelets: 208 10*3/uL (ref 150–400)
RBC: 3.88 MIL/uL — ABNORMAL LOW (ref 4.22–5.81)
RDW: 13.7 % (ref 11.5–15.5)
WBC: 14.2 10*3/uL — ABNORMAL HIGH (ref 4.0–10.5)
nRBC: 0 % (ref 0.0–0.2)

## 2021-01-23 LAB — BASIC METABOLIC PANEL
Anion gap: 7 (ref 5–15)
BUN: 9 mg/dL (ref 6–20)
CO2: 25 mmol/L (ref 22–32)
Calcium: 8.6 mg/dL — ABNORMAL LOW (ref 8.9–10.3)
Chloride: 102 mmol/L (ref 98–111)
Creatinine, Ser: 0.99 mg/dL (ref 0.61–1.24)
GFR, Estimated: >60 mL/min (ref >60)
Glucose, Bld: 136 mg/dL — ABNORMAL HIGH (ref 70–99)
Potassium: 3.9 mmol/L (ref 3.5–5.1)
Sodium: 134 mmol/L — ABNORMAL LOW (ref 135–145)

## 2021-01-23 LAB — HEPARIN LEVEL (UNFRACTIONATED): Heparin Unfractionated: 0.42 IU/mL (ref 0.30–0.70)

## 2021-01-23 MED ORDER — HYDROMORPHONE HCL 1 MG/ML IJ SOLN
1.0000 mg | Freq: Four times a day (QID) | INTRAMUSCULAR | Status: DC
  Administered 2021-01-23 – 2021-01-24 (×4): 1 mg via INTRAVENOUS
  Filled 2021-01-23 (×4): qty 1

## 2021-01-23 MED ORDER — HYDROMORPHONE HCL 1 MG/ML IJ SOLN: 1.0000 mg | Freq: Three times a day (TID) | INTRAMUSCULAR | Status: DC

## 2021-01-23 MED ORDER — GABAPENTIN 300 MG PO CAPS
600.0000 mg | ORAL_CAPSULE | Freq: Three times a day (TID) | ORAL | Status: DC
  Administered 2021-01-23 – 2021-01-25 (×7): 600 mg via ORAL
  Filled 2021-01-23 (×7): qty 2

## 2021-01-23 MED ORDER — OXYCODONE HCL 5 MG PO TABS
10.0000 mg | ORAL_TABLET | ORAL | Status: DC | PRN
  Administered 2021-01-23 – 2021-01-25 (×8): 10 mg via ORAL
  Filled 2021-01-23 (×9): qty 2

## 2021-01-23 MED ORDER — ACETAMINOPHEN 325 MG PO TABS
650.0000 mg | ORAL_TABLET | Freq: Four times a day (QID) | ORAL | Status: DC
  Administered 2021-01-23 – 2021-01-26 (×12): 650 mg via ORAL
  Filled 2021-01-23 (×12): qty 2

## 2021-01-23 NOTE — Progress Notes (Signed)
ANTICOAGULATION CONSULT NOTE - Follow Up Consult  Pharmacy Consult for Heparin Indication:  hx recurrent DVT/PE/LV thrombus  Allergies  Allergen Reactions   Contrast Media [Iodinated Diagnostic Agents] Nausea Only    NOT AN ALLERGY. ONLY NAUSEA. 12/11/20  Newly reported by [facility] on 12/04/20.    Patient Measurements: Height: '5\' 11"'$  (180.3 cm) Weight: 97.1 kg (214 lb 1.6 oz) IBW/kg (Calculated) : 75.3 Heparin Dosing Weight: 94 kg  Vital Signs: Temp: 98.6 F (37 C) (08/27 0753) Temp Source: Oral (08/27 0753) BP: 106/71 (08/27 0753) Pulse Rate: 93 (08/27 0753)  Labs: Recent Labs    01/21/21 0717 01/21/21 1511 01/22/21 0254 01/23/21 0244  HGB 11.8*  --  11.0* 10.2*  HCT 36.2*  --  34.6* 32.9*  PLT 200  --  184 208  HEPARINUNFRC  --  0.52 0.30 0.42  CREATININE 1.20  --  1.10 0.99     Estimated Creatinine Clearance: 104.9 mL/min (by C-G formula based on SCr of 0.99 mg/dL).  Assessment: 50 yo male with CP and cardiac CTA with no significant CAD. He is noted with history of PVD and multiple interventions including R AKA. 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  Patient was transitioned to IV heparin.   S/p fem-pop bypass 8/24 and heparin resumed on 8/25 am. Heparin level is therapeutic (0.42) on 2000 units/hr. Noted continued oozing from leg incision, however vascular team would like to continue heparin at this time.   Goal of Therapy:  Heparin level 0.3-0.7 units/ml Monitor platelets by anticoagulation protocol: Yes   Plan:  Continue heparin drip at 2000 units/hr Daily heparin level and CBC. Monitor oozing/ resolution. Noted plan to change back to Eliquis when drainage from leg decreases.  Thank you for allowing pharmacy to participate in this patient's care.  Reatha Harps, PharmD PGY1 Pharmacy Resident 01/23/2021 9:00 AM Check [email or web address] for unit specific pharmacy number

## 2021-01-23 NOTE — Progress Notes (Addendum)
Summary: Jose Hansen is a 50 y.o. male with severe PVD s/p multiple vascular interventions, right AKA, left femoral and popliteal artery occlusions, and recent right femoral DVT who was admitted 01/12/21 for further vascular workup of left lower extremity ischemia. Artery bypass on 01/20/21.  Subjective: Patient states he is doing well. Only complaint is pain which is more in the operating site than in the foot as previously.   Objective:  Vital signs in last 24 hours: Vitals:   01/22/21 1339 01/22/21 1959 01/23/21 0003 01/23/21 0406  BP: 126/67 125/82 115/69 112/62  Pulse: 98     Resp: '18 15 16 16  '$ Temp: 98.1 F (36.7 C) 98.8 F (37.1 C) 98.6 F (37 C) 98.2 F (36.8 C)  TempSrc: Oral Oral Oral   SpO2: 97% 98%    Weight:      Height:       Physical Exam: General: well appearing, in no acute distress CV: RRR, LLE warm to touch. DP pulses palpated.  Lungs: CTAB, no wheezes, rhonchi, or rales Abdomen: normal bowel sounds, non-tender Skin: No hematoma at left groin. TTP in the left groin. Left leg wrapped below the knee.    CBC Latest Ref Rng & Units 01/23/2021 01/22/2021 01/21/2021  WBC 4.0 - 10.5 K/uL 14.2(H) 12.7(H) 13.0(H)  Hemoglobin 13.0 - 17.0 g/dL 10.2(L) 11.0(L) 11.8(L)  Hematocrit 39.0 - 52.0 % 32.9(L) 34.6(L) 36.2(L)  Platelets 150 - 400 K/uL 208 184 200    CMP Latest Ref Rng & Units 01/23/2021 01/22/2021 01/21/2021  Glucose 70 - 99 mg/dL 136(H) 163(H) 170(H)  BUN 6 - 20 mg/dL '9 12 14  '$ Creatinine 0.61 - 1.24 mg/dL 0.99 1.10 1.20  Sodium 135 - 145 mmol/L 134(L) 134(L) 133(L)  Potassium 3.5 - 5.1 mmol/L 3.9 4.1 5.5(H)  Chloride 98 - 111 mmol/L 102 104 102  CO2 22 - 32 mmol/L 25 23 21(L)  Calcium 8.9 - 10.3 mg/dL 8.6(L) 8.4(L) 9.0  Total Protein 6.5 - 8.1 g/dL - - 6.6  Total Bilirubin 0.3 - 1.2 mg/dL - - 0.6  Alkaline Phos 38 - 126 U/L - - 61  AST 15 - 41 U/L - - 105(H)  ALT 0 - 44 U/L - - 138(H)    Assessment/Plan:  Principal Problem:   PVD (peripheral  vascular disease) (HCC) Active Problems:   Hyperlipidemia   Prediabetes   Atypical chest pain   Hx of AKA (above knee amputation), right (HCC)   Ischemia of extremity  Critical limb ischemia of the left lower extremity secondary to PAD S/P bypass on 08/24 by VVS VVS still following to rule out any complications Heparin per pharmay Transition to Eliquis prior to discharge after drainage from leg decreases Pain management: 10 mg oxycodone q4hr prn, 1 mg dilaudid q6hrs scheduled, 650 q6hr tylenol scheduled, Gabapentin 600 mg TID. Statin therapy: Lipitor '80mg'$  daily Antiplatelet therapy: Asprin 81 mg   Leukocytosis Patient has leukocytosis s/p bypass surgery. He is denying any symptoms suggestive of infection. Will continue to monitor.   Plan: -CBC with differential  -Continue to follow signs of infection  GERD Pepcid '20mg'$  daily Maloox/Mylanta prn  Chronic depression Continue symbalta and seroquel  Right common femoral DVT Continue heparin gtt by vascular. Transition slowly to Apixaban.   Chest pain--resolved Coronary Calcium score low, no further workup at this time   DVT prophx: Heparin Code:Full  Prior to Admission Living Arrangement:home Anticipated Discharge Location: Home Barriers to Discharge: vascular clearance Dispo: Anticipated discharge in approximately 2 day(s).  [name], MD Internal Medicine Resident PGY-1 [facility] Pager: [phone] 01/23/2021 7:26 AM

## 2021-01-23 NOTE — Progress Notes (Addendum)
  Progress Note    01/23/2021 7:19 AM 3 Days Post-Op  Subjective:  still having a lot of pain in the lower leg. Says they changed the dressing last night  Afebrile HR 80's-100's NSR [id number] systolic   Vitals:   [id number] 01/23/21 0406  BP: 115/69 112/62  Pulse:    Resp: 16 16  Temp: 98.6 F (37 C) 98.2 F (36.8 C)  SpO2:      Physical Exam: Cardiac:  regular Lungs:  non labored Incisions:  left groin is clean and dry; lower leg incision with serosanguinous drainage on bandage. Extremities:  palpable left PT pulse   CBC    Component Value Date/Time   WBC 14.2 (H) 01/23/2021 0244   RBC 3.88 (L) 01/23/2021 0244   HGB 10.2 (L) 01/23/2021 0244   HGB 15.4 12/03/2020 1202   HCT 32.9 (L) 01/23/2021 0244   HCT 47.0 12/03/2020 1202   PLT 208 01/23/2021 0244   PLT 288 12/03/2020 1202   MCV 84.8 01/23/2021 0244   MCV 81 12/03/2020 1202   MCH 26.3 01/23/2021 0244   MCHC 31.0 01/23/2021 0244   RDW 13.7 01/23/2021 0244   RDW 13.5 12/03/2020 1202   LYMPHSABS 3.5 01/12/2021 2145   LYMPHSABS 3.0 12/03/2020 1202   MONOABS 0.8 01/12/2021 2145   EOSABS 0.3 01/12/2021 2145   EOSABS 0.4 12/03/2020 1202   BASOSABS 0.1 01/12/2021 2145   BASOSABS 0.1 12/03/2020 1202    BMET    Component Value Date/Time   NA 134 (L) 01/23/2021 0244   NA 145 (H) 12/03/2020 1202   K 3.9 01/23/2021 0244   CL 102 01/23/2021 0244   CO2 25 01/23/2021 0244   GLUCOSE 136 (H) 01/23/2021 0244   BUN 9 01/23/2021 0244   BUN 11 12/03/2020 1202   CREATININE 0.99 01/23/2021 0244   CALCIUM 8.6 (L) 01/23/2021 0244   GFRNONAA >60 01/23/2021 0244    INR    Component Value Date/Time   INR 1.0 01/12/2021 2145     Intake/Output Summary (Last 24 hours) at 01/23/2021 0719 Last data filed at 01/23/2021 0300 Gross per 24 hour  Intake 689.98 ml  Output 1975 ml  Net -1285.02 ml     Assessment/Plan:  50 y.o. male is s/p:   left femoral to PT bypass complicated by severe scar tissue from  previous operations.   3 Days Post-Op   -pt with palpable left PT pulse -continued pain-pain med regimen being adjusted by medicine service.   -acute blood loss anemia-hgb trending downward but pt tolerating.   -DVT prophylaxis:  continue heparin gtt until closer to discharge and lower leg ooze slows. -WBC trending upward-pt remains afebrile -activity as tolerated.   Leontine Locket, PA-C [facility] [phone] 01/23/2021 7:19 AM  I have seen and evaluated the patient. I agree with the PA note as documented above.  Status post left femoral to PT bypass.  He has a palpable PT pulse at the ankle.  There have been ongoing issues with oozing of the below-knee incision.  We changed his dressing at bedside and he does have some ongoing oozing although he does feel this is somewhat improved.  I would continue heparin for my standpoint until closer to discharge.  Pain has been an issue and the medicine team has been adjusting his pain medicine.  Hopefully we can get him more comfortable today.  Marty Heck, MD [facility] [place] Office: [phone]

## 2021-01-24 DIAGNOSIS — I739 Peripheral vascular disease, unspecified: Secondary | ICD-10-CM | POA: Diagnosis not present

## 2021-01-24 LAB — CBC WITH DIFFERENTIAL/PLATELET
Abs Immature Granulocytes: 0.53 10*3/uL — ABNORMAL HIGH (ref 0.00–0.07)
Basophils Absolute: 0.1 10*3/uL (ref 0.0–0.1)
Basophils Relative: 1 %
Eosinophils Absolute: 0.4 10*3/uL (ref 0.0–0.5)
Eosinophils Relative: 3 %
HCT: 32.4 % — ABNORMAL LOW (ref 39.0–52.0)
Hemoglobin: 10.3 g/dL — ABNORMAL LOW (ref 13.0–17.0)
Immature Granulocytes: 4 %
Lymphocytes Relative: 21 %
Lymphs Abs: 2.6 10*3/uL (ref 0.7–4.0)
MCH: 26.8 pg (ref 26.0–34.0)
MCHC: 31.8 g/dL (ref 30.0–36.0)
MCV: 84.4 fL (ref 80.0–100.0)
Monocytes Absolute: 1.5 10*3/uL — ABNORMAL HIGH (ref 0.1–1.0)
Monocytes Relative: 12 %
Neutro Abs: 7.4 10*3/uL (ref 1.7–7.7)
Neutrophils Relative %: 59 %
Platelets: 206 10*3/uL (ref 150–400)
RBC: 3.84 MIL/uL — ABNORMAL LOW (ref 4.22–5.81)
RDW: 13.6 % (ref 11.5–15.5)
WBC: 12.4 10*3/uL — ABNORMAL HIGH (ref 4.0–10.5)
nRBC: 0 % (ref 0.0–0.2)

## 2021-01-24 LAB — BASIC METABOLIC PANEL
Anion gap: 8 (ref 5–15)
BUN: 8 mg/dL (ref 6–20)
CO2: 24 mmol/L (ref 22–32)
Calcium: 8.6 mg/dL — ABNORMAL LOW (ref 8.9–10.3)
Chloride: 103 mmol/L (ref 98–111)
Creatinine, Ser: 0.93 mg/dL (ref 0.61–1.24)
GFR, Estimated: >60 mL/min (ref >60)
Glucose, Bld: 186 mg/dL — ABNORMAL HIGH (ref 70–99)
Potassium: 3.7 mmol/L (ref 3.5–5.1)
Sodium: 135 mmol/L (ref 135–145)

## 2021-01-24 LAB — HEPARIN LEVEL (UNFRACTIONATED): Heparin Unfractionated: 0.32 IU/mL (ref 0.30–0.70)

## 2021-01-24 MED ORDER — HYDROMORPHONE HCL 1 MG/ML IJ SOLN
1.0000 mg | Freq: Two times a day (BID) | INTRAMUSCULAR | Status: DC | PRN
  Administered 2021-01-24 – 2021-01-25 (×3): 1 mg via INTRAVENOUS
  Filled 2021-01-24 (×3): qty 1

## 2021-01-24 MED ORDER — APIXABAN 5 MG PO TABS
5.0000 mg | ORAL_TABLET | Freq: Two times a day (BID) | ORAL | Status: DC
  Administered 2021-01-24 – 2021-01-26 (×5): 5 mg via ORAL
  Filled 2021-01-24 (×5): qty 1

## 2021-01-24 NOTE — Progress Notes (Addendum)
ANTICOAGULATION CONSULT NOTE - Follow Up Consult  Pharmacy Consult for Heparin Indication:  hx recurrent DVT/PE/LV thrombus  Allergies  Allergen Reactions   Contrast Media [Iodinated Diagnostic Agents] Nausea Only    NOT AN ALLERGY. ONLY NAUSEA. 12/11/20  Newly reported by [facility] on 12/04/20.    Patient Measurements: Height: '5\' 11"'$  (180.3 cm) Weight: 97.1 kg (214 lb 1.6 oz) IBW/kg (Calculated) : 75.3 Heparin Dosing Weight: 94 kg  Vital Signs: Temp: 98.1 F (36.7 C) (08/28 0751) Temp Source: Oral (08/28 0751) BP: 113/78 (08/28 0751) Pulse Rate: 75 (08/28 0751)  Labs: Recent Labs    01/22/21 0254 01/23/21 0244 01/24/21 0145  HGB 11.0* 10.2* 10.3*  HCT 34.6* 32.9* 32.4*  PLT 184 208 206  HEPARINUNFRC 0.30 0.42 0.32  CREATININE 1.10 0.99 0.93     Estimated Creatinine Clearance: 111.6 mL/min (by C-G formula based on SCr of 0.93 mg/dL).  Assessment: 50 yo male with CP and cardiac CTA with no significant CAD. He is noted with history of PVD and multiple interventions including R AKA. 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  Patient was transitioned to IV heparin.   S/p fem-pop bypass 8/24 and heparin resumed on 8/25 am. Heparin level is at lower end of therapeutic (0.32) on 2000 units/hr. Noted continued oozing from leg incision, however vascular team would like to continue heparin at this time. H/H and platelets are stable. Will continue at current rate, in light of leg drainage, to stay at lower end of therapeutic.   Goal of Therapy:  Heparin level 0.3-0.7 units/ml Monitor platelets by anticoagulation protocol: Yes   Plan:  Continue heparin drip at 2000 units/hr Daily heparin level and CBC. Monitor oozing/ resolution. Noted plan to change back to Eliquis when drainage from leg decreases.  Thank you for allowing pharmacy to participate in this patient's care.  Reatha Harps, PharmD PGY1 Pharmacy Resident 01/24/2021  8:28 AM Check [email or web address] for unit specific pharmacy number

## 2021-01-24 NOTE — Progress Notes (Signed)
ANTICOAGULATION CONSULT NOTE - Follow Up Consult  Pharmacy Consult for Heparin transition to Eliquis Indication:  hx recurrent DVT/PE/LV thrombus  Allergies  Allergen Reactions   Contrast Media [Iodinated Diagnostic Agents] Nausea Only    NOT AN ALLERGY. ONLY NAUSEA. 12/11/20  Newly reported by [facility] on 12/04/20.    Patient Measurements: Height: '5\' 11"'$  (180.3 cm) Weight: 97.1 kg (214 lb 1.6 oz) IBW/kg (Calculated) : 75.3 Heparin Dosing Weight: 94 kg  Vital Signs: Temp: 98.1 F (36.7 C) (08/28 0751) Temp Source: Oral (08/28 0751) BP: 113/78 (08/28 0751) Pulse Rate: 75 (08/28 0751)  Labs: Recent Labs    01/22/21 0254 01/23/21 0244 01/24/21 0145  HGB 11.0* 10.2* 10.3*  HCT 34.6* 32.9* 32.4*  PLT 184 208 206  HEPARINUNFRC 0.30 0.42 0.32  CREATININE 1.10 0.99 0.93     Estimated Creatinine Clearance: 111.6 mL/min (by C-G formula based on SCr of 0.93 mg/dL).  Assessment: 50 yo male with CP and cardiac CTA with no significant CAD. He is noted with history of PVD and multiple interventions including R AKA. 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  Patient was transitioned to IV heparin.   S/p fem-pop bypass 8/24 and heparin resumed on 8/25 am. Heparin level is at lower end of therapeutic (0.32) on 2000 units/hr. Vascular team would like to transition to Eliquis at this time. H/H and platelets are stable. Instructed nursing to stop the heparin infusion when the first dose of Eliquis is given this morning.    Goal of Therapy:  Heparin level 0.3-0.7 units/ml Monitor platelets by anticoagulation protocol: Yes   Plan:  Stop heparin drip when first dose of Eliquis is given Start Eliquis 5 mg twice daily this morning  Thank you for allowing pharmacy to participate in this patient's care.  Reatha Harps, PharmD PGY1 Pharmacy Resident 01/24/2021 10:27 AM Check [email or web address] for unit specific pharmacy number

## 2021-01-24 NOTE — Progress Notes (Addendum)
  Progress Note    01/24/2021 7:14 AM 4 Days Post-Op  Subjective:  still has pain.  Says they would not change the dressing until we have changed it.   afebrile  Vitals:   01/23/21 2347 01/24/21 0355  BP: 122/68 115/68  Pulse: 90 89  Resp: 16 16  Temp: 98.4 F (36.9 C) 98 F (36.7 C)  SpO2: 94% 100%    Physical Exam: Cardiac:  regular Lungs:  non labored Incisions:  left lower leg incision dressing changed.  Same dressing that we placed yesterday is in place and there is mild serosanguinous drainage on bandage.   No evidence of bleeding from incision.  Extremities:  palpable pedal pulse.    CBC    Component Value Date/Time   WBC 12.4 (H) 01/24/2021 0145   RBC 3.84 (L) 01/24/2021 0145   HGB 10.3 (L) 01/24/2021 0145   HGB 15.4 12/03/2020 1202   HCT 32.4 (L) 01/24/2021 0145   HCT 47.0 12/03/2020 1202   PLT 206 01/24/2021 0145   PLT 288 12/03/2020 1202   MCV 84.4 01/24/2021 0145   MCV 81 12/03/2020 1202   MCH 26.8 01/24/2021 0145   MCHC 31.8 01/24/2021 0145   RDW 13.6 01/24/2021 0145   RDW 13.5 12/03/2020 1202   LYMPHSABS 2.6 01/24/2021 0145   LYMPHSABS 3.0 12/03/2020 1202   MONOABS 1.5 (H) 01/24/2021 0145   EOSABS 0.4 01/24/2021 0145   EOSABS 0.4 12/03/2020 1202   BASOSABS 0.1 01/24/2021 0145   BASOSABS 0.1 12/03/2020 1202    BMET    Component Value Date/Time   NA 135 01/24/2021 0145   NA 145 (H) 12/03/2020 1202   K 3.7 01/24/2021 0145   CL 103 01/24/2021 0145   CO2 24 01/24/2021 0145   GLUCOSE 186 (H) 01/24/2021 0145   BUN 8 01/24/2021 0145   BUN 11 12/03/2020 1202   CREATININE 0.93 01/24/2021 0145   CALCIUM 8.6 (L) 01/24/2021 0145   GFRNONAA >60 01/24/2021 0145    INR    Component Value Date/Time   INR 1.0 01/12/2021 2145     Intake/Output Summary (Last 24 hours) at 01/24/2021 [id number] Last data filed at 01/23/2021 2102 Gross per 24 hour  Intake 423 ml  Output 1250 ml  Net -827 ml     Assessment/Plan:  50 y.o. male is s/p:   left  femoral to PT bypass complicated by severe scar tissue from previous operations.   4 Days Post-Op   -pt with palpable pedal pulse.  -lower leg incision with decreased drainage from wound and this is serosanguinous and there is no evidence of bleeding from incision.   Leukocytosis improved from yesterday and pt is afebrile -DVT prophylaxis:  (hx common femoral DVT) heparin gtt-since no evidence of bleeding and hgb is stable, ok to transition back to po anticoagulation from surgical standpoint. -PT and OT recommending HHPT/OT   Leontine Locket, PA-C [facility] [phone] 01/24/2021 7:14 AM  I have seen and evaluated the patient. I agree with the PA note as documented above.  Left PT remains palpable at the ankle after left femoral to PT bypass.  Only oozing over the last 24 hours has really been serous oozing from the staples.  Okay transitioning him to his DOAC today.  We changed his dressing at bedside.  White count improved to 12.  Marty Heck, MD [facility] [place] Office: [phone]

## 2021-01-24 NOTE — Progress Notes (Signed)
Pt. Feels pain management has not been sufficient. Has taken regularly his tylenol, oxycodone, dilaudid, gabapentin throughout today.  Pt has sustained a "9-22" pain level out of 10 with little relief from medications.  Will continue current medication orders.  Pt. request to have dilaudid, tylenol, and oxycodone given all at one time tonight to have relief to sleep.

## 2021-01-24 NOTE — Progress Notes (Signed)
Summary: Jose Hansen is a 50 y.o. male with severe PVD s/p multiple vascular interventions, right AKA, left femoral and popliteal artery occlusions, and recent right femoral DVT who was admitted 01/12/21 for further vascular workup of left lower extremity ischemia. Artery bypass on 01/20/21.  Subjective: Patient doing well. He was endorsing pain that is worse upon waking up and gets better. He stated he is started to move and do things by himself. Patient complained of pain in the left groin.   Objective:  Vital signs in last 24 hours: Vitals:   01/23/21 1449 01/23/21 1946 01/23/21 2347 01/24/21 0355  BP:  127/72 122/68 115/68  Pulse:  93 90 89  Resp: '20 19 16 16  '$ Temp:  97.9 F (36.6 C) 98.4 F (36.9 C) 98 F (36.7 C)  TempSrc:  Oral Oral Oral  SpO2:  99% 94% 100%  Weight:      Height:       Physical Exam: General: well appearing, in no acute distress CV: RRR, LLE warm to touch. DP pulses palpated.  Lungs: CTAB, no wheezes, rhonchi, or rales Abdomen: normal bowel sounds, non-tender Skin: No hematoma at left groin. TTP in the left groin still. Left leg wrapped below the knee.    CBC Latest Ref Rng & Units 01/24/2021 01/23/2021 01/22/2021  WBC 4.0 - 10.5 K/uL 12.4(H) 14.2(H) 12.7(H)  Hemoglobin 13.0 - 17.0 g/dL 10.3(L) 10.2(L) 11.0(L)  Hematocrit 39.0 - 52.0 % 32.4(L) 32.9(L) 34.6(L)  Platelets 150 - 400 K/uL 206 208 184    CMP Latest Ref Rng & Units 01/24/2021 01/23/2021 01/22/2021  Glucose 70 - 99 mg/dL 186(H) 136(H) 163(H)  BUN 6 - 20 mg/dL '8 9 12  '$ Creatinine 0.61 - 1.24 mg/dL 0.93 0.99 1.10  Sodium 135 - 145 mmol/L 135 134(L) 134(L)  Potassium 3.5 - 5.1 mmol/L 3.7 3.9 4.1  Chloride 98 - 111 mmol/L 103 102 104  CO2 22 - 32 mmol/L '24 25 23  '$ Calcium 8.9 - 10.3 mg/dL 8.6(L) 8.6(L) 8.4(L)  Total Protein 6.5 - 8.1 g/dL - - -  Total Bilirubin 0.3 - 1.2 mg/dL - - -  Alkaline Phos 38 - 126 U/L - - -  AST 15 - 41 U/L - - -  ALT 0 - 44 U/L - - -     Assessment/Plan:  Principal Problem:   PVD (peripheral vascular disease) (HCC) Active Problems:   Hyperlipidemia   Prediabetes   Atypical chest pain   Hx of AKA (above knee amputation), right (HCC)   Ischemia of extremity  Critical limb ischemia of the left lower extremity secondary to PAD S/P bypass on 08/24 by VVS VVS still following to rule out any complications No bleeding at incision site so we can restart dc heparin and restart eliquis.  Pain management: 10 mg oxycodone q4hr prn, 1 mg dilaudid q12hrs scheduled, 650 q6hr tylenol scheduled, Gabapentin 600 mg TID. Statin therapy: Lipitor '80mg'$  daily Antiplatelet therapy: Asprin 81 mg   Leukocytosis Patient has leukocytosis s/p bypass surgery. He is denying any symptoms suggestive of infection. Will continue to monitor.   Plan: -CBC with differential  -Improving and no left shift and denies any symptoms concerning for infection. 01/24/21 -Continue to follow signs of infection  GERD Pepcid '20mg'$  daily Maloox/Mylanta prn  Chronic depression Continue symbalta and seroquel  Right common femoral DVT Eliquis 5 mg BID  Chest pain--resolved Coronary Calcium score low, no further workup at this time   DVT prophx: Eliquis Code:Full  Prior to Admission  Living Arrangement:home Anticipated Discharge Location: Home Barriers to Discharge: Pain control Dispo: Anticipated discharge in approximately 1 day(s).   Jose Schuller, MD Internal Medicine Resident PGY-1 [facility] Pager: [phone] 01/24/2021 6:10 AM

## 2021-01-24 NOTE — TOC Initial Note (Signed)
Transition of Care (TOC) - Initial/Assessment Note  Marvetta Gibbons RN, BSN Transitions of Care Unit 4E- RN Case Manager See Treatment Team for direct phone #    Patient Details  Name: Jose Hansen MRN: [id number] Date of Birth: Jan 21, 1971  Transition of Care [facility]) CM/SW Contact:    Dawayne Patricia, RN Phone Number: 01/24/2021, 4:15 PM  Clinical Narrative:                 Noted pt with orders for [facility] and DME, CM in to speak with pt regarding transition needs. Per pt he moved here from [facility] and has been staying with his brother (address confirmed in epic for brother). He is asking about alternate housing options- explained that we can provide some housing resources- however do not find housing that is something he would have to follow up on himself.  Pt voiced understanding and states he plans to return to his brother's for now.  Discussed HH and DME needs- pt agreeable to [facility] services- list provided for choice Per CMS guidelines from medicare.gov website with star ratings (copy placed in shadow chart) , pt voiced he would like to review list with his brother- TOC to f/u with pt in the am.  On DME needs- pt states he has a w/c, and shower chair at home, would like a RW but does not need a BSC. Pt also asked about getting an electric wheelchair- informed pt that he should f/u with his PCP regarding that.   TOC to continue to follow for [facility] arrangements and DME.     Expected Discharge Plan: [place] Barriers to Discharge: Continued Medical Work up   Patient Goals and CMS Choice Patient states their goals for this hospitalization and ongoing recovery are:: return home CMS Medicare.gov Compare Post Acute Care list provided to:: Patient Choice offered to / list presented to : Patient  Expected Discharge Plan and Services Expected Discharge Plan: [place]   Discharge Planning Services: CM Consult Post Acute Care Choice: Durable Medical Equipment,  Home Health Living arrangements for the past 2 months: Single Family Home                 DME Arranged: Walker rolling DME Agency: [employer]       HH Arranged: PT, OT          Prior Living Arrangements/Services Living arrangements for the past 2 months: Single Family Home Lives with:: Siblings Patient language and need for interpreter reviewed:: Yes Do you feel safe going back to the place where you live?: Yes      Need for Family Participation in Patient Care: Yes (Comment) Care giver support system in place?: Yes (comment) Current home services: DME (W/C, shower seat) Criminal Activity/Legal Involvement Pertinent to Current Situation/Hospitalization: No - Comment as needed  Activities of Daily Living Home Assistive Devices/Equipment: Crutches ADL Screening (condition at time of admission) Patient's cognitive ability adequate to safely complete daily activities?: Yes Is the patient deaf or have difficulty hearing?: No Does the patient have difficulty seeing, even when wearing glasses/contacts?: No Does the patient have difficulty concentrating, remembering, or making decisions?: No Patient able to express need for assistance with ADLs?: Yes Does the patient have difficulty dressing or bathing?: No Independently performs ADLs?: Yes (appropriate for developmental age) Does the patient have difficulty walking or climbing stairs?: Yes Weakness of Legs: Left Weakness of Arms/Hands: None  Permission Sought/Granted Permission sought to share information with :  Facility Art therapist granted to share information with : Yes, Verbal Permission Granted     Permission granted to share info w AGENCY: HH/DME        Emotional Assessment Appearance:: Appears stated age Attitude/Demeanor/Rapport: Engaged Affect (typically observed): Appropriate, Accepting, Pleasant Orientation: : Oriented to Self, Oriented to Place, Oriented to  Time, Oriented to  Situation Alcohol / Substance Use: Not Applicable Psych Involvement: No (comment)  Admission diagnosis:  Chest pain [R07.9] Ischemia of extremity [I99.8] Patient Active Problem List   Diagnosis Date Noted   Ischemia of extremity 01/14/2021   Atypical chest pain 01/13/2021   Hx of AKA (above knee amputation), right ([place]) 01/13/2021   Insomnia 01/08/2021   Right arm pain 12/03/2020   Depression 12/03/2020   Prediabetes 12/03/2020   Left leg weakness 11/24/2020   DVT (deep venous thrombosis) ([place]) 11/24/2020   Essential hypertension 11/24/2020   PVD (peripheral vascular disease) ([place]) 11/24/2020   Hyperlipidemia 11/24/2020   PCP:  [name], MD Pharmacy:   [id number] [id number] [id number]: [phone]: [phone] [place]: [phone]: [phone]    Social Determinants of Health (SDOH) Interventions    Readmission Risk Interventions No flowsheet data found.

## 2021-01-25 DIAGNOSIS — I739 Peripheral vascular disease, unspecified: Secondary | ICD-10-CM | POA: Diagnosis not present

## 2021-01-25 LAB — COMPREHENSIVE METABOLIC PANEL
ALT: 59 U/L — ABNORMAL HIGH (ref 0–44)
AST: 28 U/L (ref 15–41)
Albumin: 3.1 g/dL — ABNORMAL LOW (ref 3.5–5.0)
Alkaline Phosphatase: 83 U/L (ref 38–126)
Anion gap: 8 (ref 5–15)
BUN: 10 mg/dL (ref 6–20)
CO2: 25 mmol/L (ref 22–32)
Calcium: 9.1 mg/dL (ref 8.9–10.3)
Chloride: 102 mmol/L (ref 98–111)
Creatinine, Ser: 0.99 mg/dL (ref 0.61–1.24)
GFR, Estimated: >60 mL/min (ref >60)
Glucose, Bld: 121 mg/dL — ABNORMAL HIGH (ref 70–99)
Potassium: 4 mmol/L (ref 3.5–5.1)
Sodium: 135 mmol/L (ref 135–145)
Total Bilirubin: 0.6 mg/dL (ref 0.3–1.2)
Total Protein: 6.7 g/dL (ref 6.5–8.1)

## 2021-01-25 MED ORDER — HYDROMORPHONE HCL 2 MG PO TABS
2.0000 mg | ORAL_TABLET | ORAL | Status: DC
  Administered 2021-01-25 – 2021-01-26 (×5): 2 mg via ORAL
  Filled 2021-01-25 (×6): qty 1

## 2021-01-25 MED ORDER — PREGABALIN 100 MG PO CAPS
100.0000 mg | ORAL_CAPSULE | Freq: Three times a day (TID) | ORAL | Status: DC
  Administered 2021-01-25 – 2021-01-26 (×4): 100 mg via ORAL
  Filled 2021-01-25 (×4): qty 1

## 2021-01-25 NOTE — Care Management Important Message (Signed)
Important Message  Patient Details  Name: Jose Hansen MRN: [id number] Date of Birth: Jul 31, 1970   Medicare Important Message Given:  Yes     [name] 01/25/2021, 11:17 AM

## 2021-01-25 NOTE — Progress Notes (Signed)
Physical Therapy Treatment Patient Details Name: Jose Hansen MRN: [id number] DOB: 1970/10/30 Today's Date: 01/25/2021    History of Present Illness 50 y.o. male admitted 8/16 with PVD and leg pain. 8/22 abdominal aortogram. 8/24 L bypass graft common femoral to posterior tibial artery. PMH: severe PVD, R AKA, and VTE.    PT Comments    Patient received in bed, agreeable to PT, however wants 2 people present for safety. Patient is mod independent with bed mobility, transfers with min guard and ambulated with RW and min guard. Required one seated rest while ambulating due to fatigue.  He will continue to benefit from skilled PT while here to improve endurance and safety with mobility.     Follow Up Recommendations  Home health PT;Supervision - Intermittent;Supervision for mobility/OOB     Equipment Recommendations  Rolling walker with 5" wheels    Recommendations for Other Services       Precautions / Restrictions Precautions Precautions: Fall Precaution Comments: Rt AKa, pt reports several falls in the last year Restrictions Weight Bearing Restrictions: No    Mobility  Bed Mobility Overal bed mobility: Modified Independent Bed Mobility: Supine to Sit;Sit to Supine     Supine to sit: Modified independent (Device/Increase time) Sit to supine: Modified independent (Device/Increase time)        Transfers Overall transfer level: Needs assistance Equipment used: Rolling walker (2 wheeled) Transfers: Sit to/from Stand Sit to Stand: Min guard            Ambulation/Gait Ambulation/Gait assistance: Min guard Gait Distance (Feet): 80 Feet Assistive device: Rolling walker (2 wheeled) Gait Pattern/deviations: Step-to pattern Gait velocity: decr   General Gait Details: patient limited by fatigue and pain. Required one seated rest break.   Stairs             Wheelchair Mobility    Modified Rankin (Stroke Patients Only)       Balance Overall balance  assessment: Needs assistance Sitting-balance support: Feet supported Sitting balance-Leahy Scale: Normal     Standing balance support: Bilateral upper extremity supported;During functional activity Standing balance-Leahy Scale: Fair Standing balance comment: bil UE to maintain balance with AKA                            Cognition Arousal/Alertness: Awake/alert Behavior During Therapy: WFL for tasks assessed/performed Overall Cognitive Status: Within Functional Limits for tasks assessed                                        Exercises      General Comments        Pertinent Vitals/Pain Pain Assessment: Faces Faces Pain Scale: Hurts worst Pain Location: LLe Pain Descriptors / Indicators: Aching;Discomfort;Sore Pain Intervention(s): Monitored during session;Limited activity within patient's tolerance;Patient requesting pain meds-RN notified    Home Living                      Prior Function            PT Goals (current goals can now be found in the care plan section) Acute Rehab PT Goals Patient Stated Goal: return home and to dog PT Goal Formulation: With patient Time For Goal Achievement: 02/05/21 Potential to Achieve Goals: Good Progress towards PT goals: Progressing toward goals    Frequency    Min 3X/week  PT Plan Current plan remains appropriate    Co-evaluation              AM-PAC PT "6 Clicks" Mobility   Outcome Measure  Help needed turning from your back to your side while in a flat bed without using bedrails?: None Help needed moving from lying on your back to sitting on the side of a flat bed without using bedrails?: None Help needed moving to and from a bed to a chair (including a wheelchair)?: A Little Help needed standing up from a chair using your arms (e.g., wheelchair or bedside chair)?: A Little Help needed to walk in hospital room?: A Little Help needed climbing 3-5 steps with a railing? : A  Lot 6 Click Score: 19    End of Session Equipment Utilized During Treatment: Gait belt Activity Tolerance: Patient tolerated treatment well;Patient limited by fatigue Patient left: in bed;with call bell/phone within reach Nurse Communication: Mobility status PT Visit Diagnosis: Other abnormalities of gait and mobility (R26.89);Difficulty in walking, not elsewhere classified (R26.2);Pain Pain - Right/Left: Left Pain - part of body: Leg     Time: [id number] PT Time Calculation (min) (ACUTE ONLY): 18 min  Charges:  $Gait Training: 8-22 mins                    [employer], PT, GCS 01/25/21,12:16 PM

## 2021-01-25 NOTE — Progress Notes (Addendum)
  Progress Note    01/25/2021 8:10 AM 5 Days Post-Op  Subjective:  left leg painful   Vitals:   01/24/21 2000 01/25/21 0000  BP: 131/76 130/67  Pulse: 80 82  Resp: 14 16  Temp: 98.2 F (36.8 C) 98.4 F (36.9 C)  SpO2:     Physical Exam: Cardiac:  regular Lungs: non labored Incisions:  left groin and left leg incisions are intact and well appearing, very minimal serosanguinous drainage on dressings, no active drainage appreciable or on compression Extremities:  left leg warm with palpable PT Neurologic: alert and oriented  CBC    Component Value Date/Time   WBC 12.4 (H) 01/24/2021 0145   RBC 3.84 (L) 01/24/2021 0145   HGB 10.3 (L) 01/24/2021 0145   HGB 15.4 12/03/2020 1202   HCT 32.4 (L) 01/24/2021 0145   HCT 47.0 12/03/2020 1202   PLT 206 01/24/2021 0145   PLT 288 12/03/2020 1202   MCV 84.4 01/24/2021 0145   MCV 81 12/03/2020 1202   MCH 26.8 01/24/2021 0145   MCHC 31.8 01/24/2021 0145   RDW 13.6 01/24/2021 0145   RDW 13.5 12/03/2020 1202   LYMPHSABS 2.6 01/24/2021 0145   LYMPHSABS 3.0 12/03/2020 1202   MONOABS 1.5 (H) 01/24/2021 0145   EOSABS 0.4 01/24/2021 0145   EOSABS 0.4 12/03/2020 1202   BASOSABS 0.1 01/24/2021 0145   BASOSABS 0.1 12/03/2020 1202    BMET    Component Value Date/Time   NA 135 01/25/2021 0207   NA 145 (H) 12/03/2020 1202   K 4.0 01/25/2021 0207   CL 102 01/25/2021 0207   CO2 25 01/25/2021 0207   GLUCOSE 121 (H) 01/25/2021 0207   BUN 10 01/25/2021 0207   BUN 11 12/03/2020 1202   CREATININE 0.99 01/25/2021 0207   CALCIUM 9.1 01/25/2021 0207   GFRNONAA >60 01/25/2021 0207    INR    Component Value Date/Time   INR 1.0 01/12/2021 2145     Intake/Output Summary (Last 24 hours) at 01/25/2021 0810 Last data filed at 01/24/2021 0900 Gross per 24 hour  Intake 240 ml  Output 1100 ml  Net -860 ml     Assessment/Plan:  50 y.o. Hansen is s/p  left femoral to PT bypass 5 Days Post-Op   Pain control is ongoing issue LLE well  perfused and warm with palpable PT Incisions are c/d/I. Minimal serosanguinous drainage on medial leg dressings but no active drainage Transitioned back to  Eliquis Hemodynamically stable/ VSS/ Afebrile Continue PT/OT mobilize as able HHPT/OT Dispo Home likely when pain is better controlled  DVT prophylaxis:  Eliquis   Karoline Caldwell, PA-C [facility] [phone] 01/25/2021 8:10 AM  I have independently interviewed and examined patient and agree with PA assessment and plan above.  He is still having moderate pain in his left leg.  I discussed with the patient his significant scar tissue which is likely causing his pain.  Overall he is progressing as expected is on home dose Eliquis with stable H&H.  Okay for discharge from surgical standpoint when pain is controlled  Chaos Carlile C. Donzetta Matters, MD [facility] [place] Office: [phone] Pager: [phone]

## 2021-01-25 NOTE — Progress Notes (Signed)
Summary: Jose Hansen is a 50 y.o. male with severe PVD s/p multiple vascular interventions, right AKA, left femoral and popliteal artery occlusions, and recent right femoral DVT who was admitted 01/12/21 for further vascular workup of left lower extremity ischemia. Artery bypass on 01/20/21.  Subjective: Patient doing well. He was endorsing pain that is worse upon waking up and gets better. Stated he was still having a lot of pain in the left leg. He stated he was moving around and trying to get more active.   Objective:  Vital signs in last 24 hours: Vitals:   01/24/21 2000 01/25/21 0000 01/25/21 0823 01/25/21 1250  BP: 131/76 130/67 129/70 127/65  Pulse: 80 82 86 87  Resp: '14 16 17 16  '$ Temp: 98.2 F (36.8 C) 98.4 F (36.9 C) 97.7 F (36.5 C) 97.7 F (36.5 C)  TempSrc: Oral Oral Oral Oral  SpO2:   100% 97%  Weight:      Height:       Physical Exam: General: well appearing, in no acute distress CV: RRR, LLE warm to touch. PT pulse palpated.  Lungs: CTAB, no wheezes, rhonchi, or rales Abdomen: normal bowel sounds, non-tender Skin: No hematoma at left groin. Area looks clean. TTP in the left groin still. Left leg wrapped below the knee.    CBC Latest Ref Rng & Units 01/24/2021 01/23/2021 01/22/2021  WBC 4.0 - 10.5 K/uL 12.4(H) 14.2(H) 12.7(H)  Hemoglobin 13.0 - 17.0 g/dL 10.3(L) 10.2(L) 11.0(L)  Hematocrit 39.0 - 52.0 % 32.4(L) 32.9(L) 34.6(L)  Platelets 150 - 400 K/uL 206 208 184    CMP Latest Ref Rng & Units 01/25/2021 01/24/2021 01/23/2021  Glucose 70 - 99 mg/dL 121(H) 186(H) 136(H)  BUN 6 - 20 mg/dL '10 8 9  '$ Creatinine 0.61 - 1.24 mg/dL 0.99 0.93 0.99  Sodium 135 - 145 mmol/L 135 135 134(L)  Potassium 3.5 - 5.1 mmol/L 4.0 3.7 3.9  Chloride 98 - 111 mmol/L 102 103 102  CO2 22 - 32 mmol/L '25 24 25  '$ Calcium 8.9 - 10.3 mg/dL 9.1 8.6(L) 8.6(L)  Total Protein 6.5 - 8.1 g/dL 6.7 - -  Total Bilirubin 0.3 - 1.2 mg/dL 0.6 - -  Alkaline Phos 38 - 126 U/L 83 - -  AST 15 - 41  U/L 28 - -  ALT 0 - 44 U/L 59(H) - -    Assessment/Plan:  Principal Problem:   PVD (peripheral vascular disease) (HCC) Active Problems:   Hyperlipidemia   Prediabetes   Atypical chest pain   Hx of AKA (above knee amputation), right (HCC)   Ischemia of extremity  Critical limb ischemia of the left lower extremity secondary to PAD S/P bypass on 08/24 by VVS VVS signed out from surgical stand point.  Pain management: 2 mg Dilaudid q4hr scheduled. Lyrica 100 mg TID Statin therapy: Lipitor '80mg'$  daily Antiplatelet therapy: Asprin 81 mg   GERD Pepcid '20mg'$  daily Maloox/Mylanta prn  Chronic depression Continue symbalta and seroquel  Right common femoral DVT Eliquis 5 mg BID   Leukocytosis (resolving) Patient has leukocytosis s/p bypass surgery. He is denying any symptoms suggestive of infection. Most likely inflammatory. Will continue to monitor.   Plan: -CBC with differential tomorrow   Chest pain--resolved Coronary Calcium score low, no further workup at this time   DVT prophx: Eliquis Code:Full  Prior to Admission Living Arrangement:home Anticipated Discharge Location: Home Barriers to Discharge: Pain control Dispo: Anticipated discharge in approximately 1 day(s).   [name], MD Internal Medicine Resident  PGY-1 [facility] Pager: [phone] 01/25/2021 1:51 PM

## 2021-01-25 NOTE — Progress Notes (Signed)
Occupational Therapy Treatment Patient Details Name: Jose Hansen MRN: [id number] DOB: 1970/12/15 Today's Date: 01/25/2021    History of present illness 50 y.o. male admitted 8/16 with PVD and leg pain. 8/22 abdominal aortogram. 8/24 L bypass graft common femoral to posterior tibial artery. PMH: severe PVD, R AKA, and VTE.   OT comments  [name] is progressing well. Co-tx with PT this session as pt requested +2 assist to ambulate further distances with RW and complete ADLs. Pt was min guard overall for functional mobility with RW with great balance, limited by endurance. He was also min guard for toileting tasks however required heavy use of grab bars. Pt shared that he plans to sleep on an air mattress at d/c, lower surface transfers should be practices next session if agreeable. Pt continues to benefit from continued OT. D/c plan remains appropriate.    Follow Up Recommendations  Home health OT;Supervision - Intermittent    Equipment Recommendations  3 in 1 bedside commode       Precautions / Restrictions Precautions Precautions: Fall Precaution Comments: Rt AKA, pt reports several falls in the last year Restrictions Weight Bearing Restrictions: No       Mobility Bed Mobility Overal bed mobility: Modified Independent Bed Mobility: Supine to Sit;Sit to Supine     Supine to sit: Modified independent (Device/Increase time) Sit to supine: Modified independent (Device/Increase time)        Transfers Overall transfer level: Needs assistance Equipment used: Rolling walker (2 wheeled) Transfers: Sit to/from Stand Sit to Stand: Min guard         General transfer comment: min guard for safet, vc for hand placement    Balance Overall balance assessment: Needs assistance Sitting-balance support: Feet supported Sitting balance-Leahy Scale: Normal     Standing balance support: Bilateral upper extremity supported;During functional activity Standing balance-Leahy Scale:  Fair Standing balance comment: bil UE to maintain balance with AKA                           ADL either performed or assessed with clinical judgement   ADL Overall ADL's : Needs assistance/impaired         Lower Body Dressing: Moderate assistance;Sit to/from stand Lower Body Dressing Details (indicate cue type and reason): pt unable to don is own sock Toilet Transfer: Min guard;Cueing for safety;Ambulation;Regular Toilet;Grab bars;RW [employer] forces technical [employer] Details (indicate cue type and reason): heavy use of grab bars for decending and ascending from toilet Toileting- Clothing Manipulation and Hygiene: Supervision/safety;Sitting/lateral lean       Functional mobility during ADLs: Min guard;+2 for safety/equipment;Rolling walker (pt does not need +2 assist but he will ambulate further distances with the support of +2 for chair follow. Overall min gaurd) General ADL Comments: Pt shared that he plans to sleep on an airmatress at d/c, need to practice tranferring frmo lower surfaces      Cognition Arousal/Alertness: Awake/alert Behavior During Therapy: WFL for tasks assessed/performed Overall Cognitive Status: Within Functional Limits for tasks assessed              Exercises General Exercises - Lower Extremity Hip Flexion/Marching: AROM;Left;Seated;10 reps   Shoulder Instructions       General Comments VSS on RA    Pertinent Vitals/ Pain       Pain Assessment: Faces Faces Pain Scale: Hurts worst Pain Location: LLe Pain Descriptors / Indicators: Aching;Discomfort;Sore Pain Intervention(s): Limited activity within patient's tolerance;Patient requesting pain meds-RN notified   Frequency  Min 2X/week        Progress Toward Goals  OT Goals(current goals can now be found in the care plan section)     Acute Rehab OT Goals Patient Stated Goal: return home and to dog OT Goal Formulation: With patient Time For Goal Achievement: 02/05/21 Potential to Achieve  Goals: Good ADL Goals Pt Will Perform Grooming: with modified independence;standing Pt Will Perform Lower Body Dressing: with modified independence;with adaptive equipment;sitting/lateral leans Pt Will Transfer to Toilet: with modified independence;ambulating;bedside commode Pt Will Perform Toileting - Clothing Manipulation and hygiene: with modified independence;sitting/lateral leans Pt Will Perform Tub/Shower Transfer: Tub transfer;with modified independence;shower seat;ambulating  Plan Discharge plan remains appropriate    Co-evaluation    PT/OT/SLP Co-Evaluation/Treatment: Yes Reason for Co-Treatment: Complexity of the patient's impairments (multi-system involvement);For patient/therapist safety;To address functional/ADL transfers   OT goals addressed during session: ADL's and self-care;Strengthening/ROM      AM-PAC OT "6 Clicks" Daily Activity     Outcome Measure   Help from another person eating meals?: None Help from another person taking care of personal grooming?: A Little Help from another person toileting, which includes using toliet, bedpan, or urinal?: A Little Help from another person bathing (including washing, rinsing, drying)?: A Little Help from another person to put on and taking off regular upper body clothing?: None Help from another person to put on and taking off regular lower body clothing?: A Little 6 Click Score: 20    End of Session Equipment Utilized During Treatment: Gait belt;Rolling walker  OT Visit Diagnosis: Other abnormalities of gait and mobility (R26.89);Pain;History of falling (Z91.81) Pain - Right/Left: Left Pain - part of body: Leg   Activity Tolerance Patient tolerated treatment well   Patient Left in bed;with call bell/phone within reach;with bed alarm set   Nurse Communication Mobility status;Patient requests pain meds        Time: [id number] OT Time Calculation (min): 25 min  Charges: OT General Charges $OT Visit: 1 Visit OT  Treatments $Self Care/Home Management : 8-22 mins    Danean Marner A Cherl Gorney 01/25/2021, 1:06 PM

## 2021-01-25 NOTE — TOC Progression Note (Signed)
Transition of Care (TOC) - Progression Note  Marvetta Gibbons RN, BSN Transitions of Care Unit 4E- RN Case Manager See Treatment Team for direct phone #    Patient Details  Name: Jose Hansen MRN: [id number] Date of Birth: 1971/03/19  Transition of Care [facility]) CM/SW Contact  Dahlia Client, Romeo Rabon, RN Phone Number: 01/25/2021, 3:50 PM  Clinical Narrative:    F/u done with pt for [facility] choice- per pt he did not have chance to review list with brother yet- needs more time- TOC will f/u again tomorrow.  Housing resources provided to pt as per request for [facility].    Expected Discharge Plan: [place] Barriers to Discharge: Continued Medical Work up  Expected Discharge Plan and Services Expected Discharge Plan: [place]   Discharge Planning Services: CM Consult Post Acute Care Choice: Durable Medical Equipment, Home Health Living arrangements for the past 2 months: Single Family Home                 DME Arranged: Walker rolling DME Agency: [employer]       HH Arranged: PT, OT           Social Determinants of Health (SDOH) Interventions    Readmission Risk Interventions No flowsheet data found.

## 2021-01-25 NOTE — Discharge Summary (Addendum)
Name: Jose Hansen MRN: [id number] DOB: 1971-03-22 50 y.o. PCP: [name], MD  Date of Admission: 01/12/2021  9:19 PM Date of Discharge: 01/26/21 Attending Physician: Lucious Groves, DO  Discharge Diagnosis: 1. 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 Chronic Right common femoral DVT 3. Depression 4. GERD 5. Insomnia   Discharge Medications: Allergies as of 01/26/2021       Reactions   Contrast Media [iodinated Diagnostic Agents] Nausea Only   NOT AN ALLERGY. ONLY NAUSEA. 12/11/20 Newly reported by [facility] on 12/04/20.        Medication List     STOP taking these medications    gabapentin 300 MG capsule Commonly known as: NEURONTIN       TAKE these medications    acetaminophen 325 MG tablet Commonly known as: TYLENOL Take 2 tablets (650 mg total) by mouth every 6 (six) hours as needed for moderate pain or mild pain.   aspirin EC 81 MG tablet Take 81 mg by mouth daily. Swallow whole.   atorvastatin 80 MG tablet Commonly known as: Lipitor Take 1 tablet (80 mg total) by mouth daily.   DULoxetine 60 MG capsule Commonly known as: Cymbalta Take 1 capsule (60 mg total) by mouth daily.   Eliquis 5 MG Tabs tablet Generic drug: apixaban Take 1 tablet (5 mg total) by mouth 2 (two) times daily.   famotidine 20 MG tablet Commonly known as: PEPCID Take 1 tablet (20 mg total) by mouth daily. Start taking on: January 27, 2021   oxyCODONE 15 MG immediate release tablet Commonly known as: ROXICODONE Take 1 tablet (15 mg total) by mouth every 4 (four) hours as needed for up to 5 days for severe pain or breakthrough pain.   pregabalin 100 MG capsule Commonly known as: LYRICA Take 1 capsule (100 mg total) by mouth 3 (three) times daily.   QUEtiapine 50 MG tablet Commonly known as: SEROQUEL Take 3 tablets (150 mg total) by mouth at bedtime.               Durable Medical Equipment  (From admission, onward)            Start     Ordered   01/24/21 1314  For home use only DME Bedside commode  Once       Question:  Patient needs a bedside commode to treat with the following condition  Answer:  Hx of AKA (above knee amputation) ([place])   01/24/21 1314   01/24/21 1027  For home use only DME Walker rolling  Once       Question Answer Comment  Walker: With [place]   Patient needs a walker to treat with the following condition Ischemia of extremity      01/24/21 1027              Discharge Care Instructions  (From admission, onward)           Start     Ordered   01/26/21 0000  Change dressing (specify)       Comments: Dressing change: once per day using clean supplies.   01/26/21 1312            Disposition and follow-up:   Jose Hansen was discharged from [facility] in Stable condition.  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 Patient had  pain at the surgical site but not in the foot. Please follow for any other symptom development.  Right common femoral DVT: Patient has history of DVT and CT abdomen showed a chronic DVT in the right femoral vein. Patient on eliquis 5 mg BID and aspirin 81 mg. Please ensure patient adheres to this regimen.   Chronic Management: Patient has depression, GERD, and Insomnia. He endorsed a history of Schizophrenia. Please follow up on these and arrange for psychiatric consult..   2.  Labs / imaging needed at time of follow-up: CBC  3.  Pending labs/ test needing follow-up: None  Follow-up Appointments:  Follow-up Information     [name], MD Follow up in 3 day(s).   Specialty: Internal Medicine Contact information: [place] [address] [place], [facility] Follow up.   Why: rolling walker arranged- to be delivered to room prior to discharge Contact information: [place] [address] [phone]         Care, [facility] Follow up.   Specialty: [place] Why: HHPT/OT arranged- they will contact you to schedule home visits Contact information: [address] [place] [address] [phone]                 Hospital Course by Problem List: Left Leg Ischemia 2/2 to PAD: Patient presented with severe pain in the left leg. He was previously seen by vascular surgeon who stated patient could undergo bypass or needs amputation as the bypass would use prosthetic material as vein had been previously harvested. Patient proceeded with angiogram and then decided on getting the bypass. Vascular surgery performed the bypass. Patient pain from the severe leg ischemia as well as his pain his post operation pain was managed by the internal medicine team. He was cleared by vascular surgery for discharge and he was discharged with oral pain medication with PCP follow.  GERD: Patient complained of burning chest pain after eating that was relieved by Myloox. Pepcid 20 mg daily was prescribed. He was discharged with this medicine.   Depression and Insomnia:Patient depression was managed by Duloxetine and Seroquel. He stated this helped him with sleep as well. He endorsed auditory hallucinations when not on this medication. Please refer to psychiatry. Chaplain was consulted and patient appreciated speaking with them.   Subjective: Patient states his dilaudid was not working as we transitioned him from IV to oral. He stated percocet works better for him. Denied any other complaints except pain. Marland Kitchen Discharge Exam:   BP 130/65 (BP Location: Right Arm)   Pulse 76   Temp 97.8 F (36.6 C) (Oral)   Resp 18   Ht '5\' 11"'$  (1.803 m)   Wt 97.1 kg   SpO2 100%   BMI 29.86 kg/m  Discharge exam:  General: well appearing, in no acute distress CV: RRR, LLE warm to touch. PT pulse palpated.  Lungs: CTAB, no wheezes, rhonchi, or  rales Abdomen: normal bowel sounds, non-tender Skin: No hematoma at left groin. Area looks clean. TTP in the left groin still. Left leg still wrapped below the knee.   Pertinent Labs, Studies, and Procedures:  CBC Latest Ref Rng & Units 01/26/2021 01/24/2021 01/23/2021  WBC 4.0 - 10.5 K/uL 10.8(H) 12.4(H) 14.2(H)  Hemoglobin 13.0 - 17.0 g/dL 11.9(L) 10.3(L) 10.2(L)  Hematocrit 39.0 - 52.0 % 37.0(L) 32.4(L) 32.9(L)  Platelets 150 - 400 K/uL 330 206 208  CMP Latest Ref Rng & Units 01/25/2021 01/24/2021 01/23/2021  Glucose 70 - 99 mg/dL 121(H) 186(H) 136(H)  BUN 6 - 20 mg/dL '10 8 9  '$ Creatinine 0.61 - 1.24 mg/dL 0.99 0.93 0.99  Sodium 135 - 145 mmol/L 135 135 134(L)  Potassium 3.5 - 5.1 mmol/L 4.0 3.7 3.9  Chloride 98 - 111 mmol/L 102 103 102  CO2 22 - 32 mmol/L '25 24 25  '$ Calcium 8.9 - 10.3 mg/dL 9.1 8.6(L) 8.6(L)  Total Protein 6.5 - 8.1 g/dL 6.7 - -  Total Bilirubin 0.3 - 1.2 mg/dL 0.6 - -  Alkaline Phos 38 - 126 U/L 83 - -  AST 15 - 41 U/L 28 - -  ALT 0 - 44 U/L 59(H) - -   CT Angio Aortobifemoral W and/or Wo Contrast  Result Date: 01/12/2021 CLINICAL DATA:  History of occluded right iliac system and previous right above the knee amputation with leg pain and back pain, initial encounter EXAM: CT ANGIOGRAPHY OF ABDOMINAL AORTA WITH ILIOFEMORAL RUNOFF IMPRESSION: VASCULAR Stable long segment occlusion involving the right iliac arterial system and extending into the common femoral and superficial femoral artery. Patent left common iliac artery stent. Long segment left superficial femoral artery occlusion and proximal popliteal occlusion. The more distal popliteal artery is occluded following reconstitution with multiple muscular collaterals reconstituting single-vessel runoff via the posterior tibial artery to the ankle. The overall appearance of these findings is stable from the prior exam. NON-VASCULAR Nonvascular structures of the lower extremities are within normal limits with the exception of  changes of prior above knee amputation on the right. Electronically Signed   By: Inez Catalina M.D.   On: 01/12/2021 23:59   DG Chest Portable 1 View  Result Date: 01/12/2021 CLINICAL DATA:  Chest pain and shortness of breath EXAM: PORTABLE CHEST 1 VIEW COMPARISON:  11/24/2020  IMPRESSION: No active disease. Electronically Signed   By: Inez Catalina M.D.   On: 01/12/2021 22:01   ECHOCARDIOGRAM COMPLETE  Result Date: 01/13/2021    ECHOCARDIOGRAM REPORT   Patient Name:   Jose Hansen Date of Exam: 01/13/2021 Medical Rec #:  [id number]         Height:       71.0 in Accession #:    [id number]        Weight:       209.0 lb Date of Birth:  27-Dec-1970          BSA:          2.148 m Patient Age:    51 years          BP:           126/78 mmHg Patient Gender: M                 HR:           83 bpm. Exam Location:  Inpatient Procedure: 2D Echo, Cardiac Doppler, Color Doppler and Intracardiac            Opacification Agent Indications:    Chest pain  History:        Patient has no prior history of Echocardiogram examinations.                 PVD, Signs/Symptoms:DVT, Right AKA; Risk Factors:Former Smoker.   IMPRESSIONS  1. Mild inferior and inferoseptal hypokinesis. Left ventricular ejection fraction, by estimation, is 55 to 60%. The left ventricle has normal function. The left ventricle has no regional wall motion abnormalities.  Left ventricular diastolic parameters are consistent with Grade I diastolic dysfunction (impaired relaxation).  2. Right ventricular systolic function is normal. The right ventricular size is normal. There is normal pulmonary artery systolic pressure.  3. The mitral valve is normal in structure. No evidence of mitral valve regurgitation. No evidence of mitral stenosis.  4. The aortic valve is tricuspid. Aortic valve regurgitation is not visualized. No aortic stenosis is present.  5. The inferior vena cava is normal in size with greater than 50% respiratory variability, suggesting right atrial  pressure of 3 mmHg.   CT Angio Chest/Abd/Pel for Dissection W and/or Wo Contrast  Result Date: 01/13/2021 CLINICAL DATA:  Chest pain, evaluate for dissection. EXAM: CT ANGIOGRAPHY CHEST, ABDOMEN AND PELVIS TECHNIQUE:  IMPRESSION: No evidence of thoracoabdominal aortic aneurysm or dissection. 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 No evidence of acute pulmonary embolism. Refer to dedicated abdominal runoff report for additional pertinent vascular findings. Electronically Signed   By: Julian Hy M.D.   On: 01/13/2021 00:15     Discharge Instructions: Discharge Instructions     Call MD for:  difficulty breathing, headache or visual disturbances   Complete by: As directed    Call MD for:  extreme fatigue   Complete by: As directed    Call MD for:  hives   Complete by: As directed    Call MD for:  persistant dizziness or light-headedness   Complete by: As directed    Call MD for:  persistant nausea and vomiting   Complete by: As directed    Call MD for:  redness, tenderness, or signs of infection (pain, swelling, redness, odor or green/yellow discharge around incision site)   Complete by: As directed    Call MD for:  temperature >100.4   Complete by: As directed    Change dressing (specify)   Complete by: As directed    Dressing change: once per day using clean supplies.   Discharge instructions   Complete by: As directed    You came in for left leg pain. Your pain was managed until vascular surgery evaluated you. You were seen by vascular surgery and they performed a bypass. After the surgery, your medical team and vascular surgery followed you to see if any complications develop. Your post-operative pain was controlled. Please follow up with your PCP after discharge.   Increase activity slowly   Complete by: As directed        Signed: [name], MD 01/26/2021, 1:59 PM   Pager: [phone]

## 2021-01-26 ENCOUNTER — Other Ambulatory Visit (HOSPITAL_COMMUNITY): Payer: Self-pay

## 2021-01-26 DIAGNOSIS — I739 Peripheral vascular disease, unspecified: Secondary | ICD-10-CM | POA: Diagnosis not present

## 2021-01-26 LAB — CBC WITH DIFFERENTIAL/PLATELET
Abs Immature Granulocytes: 0.38 10*3/uL — ABNORMAL HIGH (ref 0.00–0.07)
Basophils Absolute: 0.1 10*3/uL (ref 0.0–0.1)
Basophils Relative: 1 %
Eosinophils Absolute: 0.3 10*3/uL (ref 0.0–0.5)
Eosinophils Relative: 2 %
HCT: 37 % — ABNORMAL LOW (ref 39.0–52.0)
Hemoglobin: 11.9 g/dL — ABNORMAL LOW (ref 13.0–17.0)
Immature Granulocytes: 4 %
Lymphocytes Relative: 15 %
Lymphs Abs: 1.6 10*3/uL (ref 0.7–4.0)
MCH: 26.9 pg (ref 26.0–34.0)
MCHC: 32.2 g/dL (ref 30.0–36.0)
MCV: 83.7 fL (ref 80.0–100.0)
Monocytes Absolute: 0.9 10*3/uL (ref 0.1–1.0)
Monocytes Relative: 9 %
Neutro Abs: 7.6 10*3/uL (ref 1.7–7.7)
Neutrophils Relative %: 69 %
Platelets: 330 10*3/uL (ref 150–400)
RBC: 4.42 MIL/uL (ref 4.22–5.81)
RDW: 13.4 % (ref 11.5–15.5)
WBC: 10.8 10*3/uL — ABNORMAL HIGH (ref 4.0–10.5)
nRBC: 0 % (ref 0.0–0.2)

## 2021-01-26 MED ORDER — FAMOTIDINE 20 MG PO TABS
20.0000 mg | ORAL_TABLET | Freq: Every day | ORAL | 0 refills | Status: DC
  Filled 2021-01-26: qty 30, 30d supply, fill #0

## 2021-01-26 MED ORDER — OXYCODONE HCL 15 MG PO TABS
15.0000 mg | ORAL_TABLET | ORAL | 0 refills | Status: AC | PRN
  Filled 2021-01-26: qty 30, 5d supply, fill #0

## 2021-01-26 MED ORDER — ACETAMINOPHEN 325 MG PO TABS: 650.0000 mg | ORAL_TABLET | Freq: Four times a day (QID) | ORAL | Status: AC | PRN

## 2021-01-26 MED ORDER — OXYCODONE HCL 5 MG PO TABS
15.0000 mg | ORAL_TABLET | ORAL | Status: DC | PRN
  Administered 2021-01-26 (×2): 15 mg via ORAL
  Filled 2021-01-26 (×2): qty 3

## 2021-01-26 MED ORDER — PREGABALIN 100 MG PO CAPS
100.0000 mg | ORAL_CAPSULE | Freq: Three times a day (TID) | ORAL | 0 refills | Status: DC
  Filled 2021-01-26: qty 90, 30d supply, fill #0

## 2021-01-26 NOTE — TOC Transition Note (Signed)
Transition of Care (TOC) - CM/SW Discharge Note Marvetta Gibbons RN, BSN Transitions of Care Unit 4E- RN Case Manager See Treatment Team for direct phone #    Patient Details  Name: BARDO GIUSTINO MRN: [id number] Date of Birth: 1971-04-14  Transition of Care [facility] [place]) CM/SW Contact:  Dawayne Patricia, RN Phone Number: 01/26/2021, 3:09 PM   Clinical Narrative:    Pt stable for transition home today, [employer] to deliver RW to the room for home prior to discharge.  CM followed up with pt regarding HH choice- per pt he still has not been able to discuss with brother, he states he does not have a preference and will defer to CM to secure an agency on his behalf.   Call made to [facility] with [facility]- referral has been accepted- informed Tommi Rumps that pt has a dog in the home so that staff would be aware prior to visit, and also let him know ahead when they are coming so he can put dog up. Cory noted on referral.   Per pt he will need some paper scrubs for d/c as well as transportation assistance home. Unit will set pt up with [facility] transport (wheelchair assist) once he is ready- per pt he has a wheelchair at home that he can use once he is there, RW to be at the bedside to take home prior to d/c. - Bedside RN aware of plan.    Final next level of care: [place] Barriers to Discharge: Barriers Resolved   Patient Goals and CMS Choice Patient states their goals for this hospitalization and ongoing recovery are:: return home CMS Medicare.gov Compare Post Acute Care list provided to:: Patient Choice offered to / list presented to : Patient  Discharge Placement                 Home w/ [facility]      Discharge Plan and Services   Discharge Planning Services: CM Consult Post Acute Care Choice: Durable Medical Equipment, Home Health          DME Arranged: Walker rolling DME Agency: [employer] Date DME Agency Contacted: 01/25/21 Time DME Agency Contacted: [phone] Representative spoke  with at DME Agency: Freda Munro HH Arranged: PT, OT [facility] Agency: [place] Date [place]: 01/26/21 Time [place]: [id number] Representative spoke with at [place]: [place] ([place]) Interventions     Readmission Risk Interventions Readmission Risk Prevention Plan 01/26/2021  Transportation Screening Complete  PCP or Specialist Appt within 5-7 Days Complete  Home Care Screening Complete  Medication Review (RN CM) Complete  Some recent data might be hidden

## 2021-01-26 NOTE — Progress Notes (Signed)
  Summary: Jose Hansen is a 50 y.o. male with severe PVD s/p multiple vascular interventions, right AKA, left femoral and popliteal artery occlusions, and recent right femoral DVT who was admitted 01/12/21 for further vascular workup of left lower extremity ischemia. Artery bypass on 01/20/21.  Subjective: Patient states his dilaudid was not working as we transitioned him from IV to oral. He stated percocet works better for him. Denied any other complaints except pain. .  Objective:  Vital signs in last 24 hours: Vitals:   01/25/21 1628 01/25/21 2016 01/25/21 2331 01/26/21 0336  BP: 127/62 136/78 138/72 130/65  Pulse: 83 85 80 76  Resp: '16 17 20 18  '$ Temp: 97.8 F (36.6 C) 97.8 F (36.6 C) 97.8 F (36.6 C) 97.8 F (36.6 C)  TempSrc: Oral Oral Oral Oral  SpO2: 100% 100% 100% 100%  Weight:      Height:       Physical Exam: General: well appearing, in no acute distress CV: RRR, LLE warm to touch. PT pulse palpated.  Lungs: CTAB, no wheezes, rhonchi, or rales Abdomen: normal bowel sounds, non-tender Skin: No hematoma at left groin. Area looks clean. TTP in the left groin still. Left leg still wrapped below the knee.    CBC Latest Ref Rng & Units 01/24/2021 01/23/2021 01/22/2021  WBC 4.0 - 10.5 K/uL 12.4(H) 14.2(H) 12.7(H)  Hemoglobin 13.0 - 17.0 g/dL 10.3(L) 10.2(L) 11.0(L)  Hematocrit 39.0 - 52.0 % 32.4(L) 32.9(L) 34.6(L)  Platelets 150 - 400 K/uL 206 208 184    CMP Latest Ref Rng & Units 01/25/2021 01/24/2021 01/23/2021  Glucose 70 - 99 mg/dL 121(H) 186(H) 136(H)  BUN 6 - 20 mg/dL '10 8 9  '$ Creatinine 0.61 - 1.24 mg/dL 0.99 0.93 0.99  Sodium 135 - 145 mmol/L 135 135 134(L)  Potassium 3.5 - 5.1 mmol/L 4.0 3.7 3.9  Chloride 98 - 111 mmol/L 102 103 102  CO2 22 - 32 mmol/L '25 24 25  '$ Calcium 8.9 - 10.3 mg/dL 9.1 8.6(L) 8.6(L)  Total Protein 6.5 - 8.1 g/dL 6.7 - -  Total Bilirubin 0.3 - 1.2 mg/dL 0.6 - -  Alkaline Phos 38 - 126 U/L 83 - -  AST 15 - 41 U/L 28 - -  ALT 0 - 44 U/L  59(H) - -    Assessment/Plan:  Principal Problem:   PVD (peripheral vascular disease) (HCC) Active Problems:   Hyperlipidemia   Prediabetes   Atypical chest pain   Hx of AKA (above knee amputation), right (HCC)   Ischemia of extremity  Critical limb ischemia of the left lower extremity secondary to PAD S/P bypass on 08/24 by VVS VVS signed out from surgical stand point.  Pain management: Changed to Oxycodone 15 mg for pain. Tylenol 650 q6hrs scheduled. Lyrica 100 mg TID Statin therapy: Lipitor '80mg'$  daily Antiplatelet therapy: Asprin 81 mg   GERD Pepcid '20mg'$  daily Maloox/Mylanta prn  Chronic depression Continue symbalta and seroquel  Right common femoral DVT Eliquis 5 mg BID   Chest pain--resolved Coronary Calcium score low, no further workup at this time   DVT prophx: Eliquis Code:Full  Prior to Admission Living Arrangement:home Anticipated Discharge Location: Home Barriers to Discharge: Pain control Dispo: Anticipated discharge later today.   [name], MD Internal Medicine Resident PGY-1 [facility] Pager: [phone] 01/26/2021 10:19 AM

## 2021-01-26 NOTE — Progress Notes (Signed)
Physical Therapy Treatment Patient Details Name: Jose Hansen MRN: [id number] DOB: 1971-04-27 Today's Date: 01/26/2021    History of Present Illness 50 y.o. male admitted 8/16 with PVD and leg pain. 8/22 abdominal aortogram. 8/24 L bypass graft common femoral to posterior tibial artery. PMH: severe PVD, R AKA, and VTE.    PT Comments    Patient received in bed, he reports pain in groin, but is talkative and pleasant. Patient feels as though he is being rushed out of here. He is mod independent with bed mobility, transfers with min guard. Ambulated 100 feet with RW and min guard. One seated rest break during ambulation due to fatigue and pain. Patient will continue to benefit from skilled PT while here to improve endurance and functional independence.     Follow Up Recommendations  Home health PT;Supervision - Intermittent;Supervision for mobility/OOB     Equipment Recommendations  Rolling walker with 5" wheels    Recommendations for Other Services       Precautions / Restrictions Precautions Precautions: Fall Precaution Comments: Rt AKA, pt reports several falls in the last year Restrictions Weight Bearing Restrictions: No    Mobility  Bed Mobility Overal bed mobility: Modified Independent Bed Mobility: Supine to Sit;Sit to Supine     Supine to sit: Modified independent (Device/Increase time) Sit to supine: Modified independent (Device/Increase time)        Transfers Overall transfer level: Needs assistance Equipment used: Rolling walker (2 wheeled) Transfers: Sit to/from Stand Sit to Stand: Min guard         General transfer comment: min guard for safety  Ambulation/Gait Ambulation/Gait assistance: Min guard Gait Distance (Feet): 100 Feet Assistive device: Rolling walker (2 wheeled) Gait Pattern/deviations: Step-to pattern Gait velocity: decr   General Gait Details: patient limited by fatigue and pain. Required one seated rest break. Demonstrates good  strength and balance with mobility.   Stairs             Wheelchair Mobility    Modified Rankin (Stroke Patients Only)       Balance Overall balance assessment: Needs assistance Sitting-balance support: Feet supported Sitting balance-Leahy Scale: Normal     Standing balance support: Bilateral upper extremity supported;During functional activity Standing balance-Leahy Scale: Good Standing balance comment: bil UE to maintain balance with AKA                            Cognition Arousal/Alertness: Awake/alert Behavior During Therapy: WFL for tasks assessed/performed Overall Cognitive Status: Within Functional Limits for tasks assessed                                        Exercises      General Comments        Pertinent Vitals/Pain Pain Assessment: Faces Faces Pain Scale: Hurts even more Pain Location: LLe, groin Pain Descriptors / Indicators: Aching;Discomfort;Sore Pain Intervention(s): Monitored during session    Home Living                      Prior Function            PT Goals (current goals can now be found in the care plan section) Acute Rehab PT Goals Patient Stated Goal: return home with brother, find a place to live PT Goal Formulation: With patient Time For Goal Achievement: 02/05/21 Potential to  Achieve Goals: Good Progress towards PT goals: Progressing toward goals    Frequency    Min 3X/week      PT Plan Current plan remains appropriate    Co-evaluation              AM-PAC PT "6 Clicks" Mobility   Outcome Measure  Help needed turning from your back to your side while in a flat bed without using bedrails?: None Help needed moving from lying on your back to sitting on the side of a flat bed without using bedrails?: None Help needed moving to and from a bed to a chair (including a wheelchair)?: A Little Help needed standing up from a chair using your arms (e.g., wheelchair or bedside  chair)?: A Little Help needed to walk in hospital room?: A Little Help needed climbing 3-5 steps with a railing? : A Lot 6 Click Score: 19    End of Session Equipment Utilized During Treatment: Gait belt Activity Tolerance: Patient tolerated treatment well;Patient limited by fatigue Patient left: in bed;with call bell/phone within reach Nurse Communication: Mobility status PT Visit Diagnosis: Other abnormalities of gait and mobility (R26.89);Difficulty in walking, not elsewhere classified (R26.2);Pain;Muscle weakness (generalized) (M62.81) Pain - Right/Left: Left Pain - part of body: Leg     Time: 1325-1346 PT Time Calculation (min) (ACUTE ONLY): 21 min  Charges:  $Gait Training: 8-22 mins                    Yvan Dority, PT, GCS 01/26/21,1:55 PM

## 2021-01-26 NOTE — Progress Notes (Signed)
    He has a palpable left PT pulse.  All incisions appear intact with minimal bruising below the knee.  He does have significant pain.  He is okay for discharge from a vascular standpoint.  Jose Hansen C. Donzetta Matters, MD [facility] Pager: [phone]

## 2021-01-27 ENCOUNTER — Telehealth: Payer: Self-pay | Admitting: Internal Medicine

## 2021-01-27 NOTE — Telephone Encounter (Signed)
TOC HFU appointment scheduled for 02/04/2021 at 2:30 pm.  Unable to contact patient via telephone, left detailed message, and appointment letter mailed.

## 2021-01-27 NOTE — Telephone Encounter (Signed)
-----   Message from [name], MD sent at 01/26/2021  1:00 PM EDT ----- Regarding: Hospital Follow up Hello,   Please schedule this patient for a hospital follow up in 7-10 days.   Sincerely, [name] MD

## 2021-01-29 ENCOUNTER — Telehealth: Payer: Self-pay | Admitting: Internal Medicine

## 2021-01-29 NOTE — Telephone Encounter (Signed)
Requesting call back on wound care how to change his bandages.  Nurse come two days ago and not sure what to do.

## 2021-01-29 NOTE — Telephone Encounter (Signed)
RTC Pt states he had bypass graft on 8/22 and his "entire incision is now open and bleeding bright red blood". He states it has saturated the dressing.   Pt instructed to call his vascular surgeon, Dr. Donzetta Matters and his office number was provided.  Pt informed vascular should have information on phone regarding how to get in touch with o/c MD if their office is closed.  Instructed pt to present to ED if he is unable to speak with anyone from vascular.  He verbalized understanding. SChaplin, RN,BSN

## 2021-02-02 ENCOUNTER — Telehealth: Payer: Self-pay | Admitting: Internal Medicine

## 2021-02-02 NOTE — Telephone Encounter (Signed)
Returned call to [place], [place] with Alvis Lemmings  Requesting VO for [facility] OT 1 week 4 to work on independence and safety with ADLs and IADLs. And [facility] SW Eval for help with alternative living arrangements. Verbal auth given. Will route to PCP for agreement/denial.

## 2021-02-02 NOTE — Telephone Encounter (Signed)
Rec'd call from [facility] OT requesting VO for OT for 1 time for 4 weeks and HH SW.  Please call back.

## 2021-02-03 ENCOUNTER — Emergency Department (HOSPITAL_COMMUNITY)
Admission: EM | Admit: 2021-02-03 | Discharge: 2021-02-03 | Disposition: A | Discharge status: Home / Self Care | Payer: Medicare Other | Source: Home / Self Care

## 2021-02-03 ENCOUNTER — Ambulatory Visit: Payer: Medicare Other | Admitting: Behavioral Health

## 2021-02-03 DIAGNOSIS — Z5321 Procedure and treatment not carried out due to patient leaving prior to being seen by health care provider: Secondary | ICD-10-CM | POA: Insufficient documentation

## 2021-02-03 DIAGNOSIS — T8130XA Disruption of wound, unspecified, initial encounter: Secondary | ICD-10-CM | POA: Diagnosis not present

## 2021-02-03 DIAGNOSIS — M79672 Pain in left foot: Secondary | ICD-10-CM | POA: Insufficient documentation

## 2021-02-03 DIAGNOSIS — M7989 Other specified soft tissue disorders: Secondary | ICD-10-CM | POA: Diagnosis not present

## 2021-02-03 NOTE — ED Notes (Signed)
Pt called cab and left ED.

## 2021-02-04 ENCOUNTER — Encounter: Payer: Self-pay | Admitting: Internal Medicine

## 2021-02-04 ENCOUNTER — Other Ambulatory Visit: Payer: Self-pay

## 2021-02-04 ENCOUNTER — Inpatient Hospital Stay (HOSPITAL_COMMUNITY)
Admission: EM | Admit: 2021-02-04 | Discharge: 2021-02-12 | DRG: 921 | Disposition: A | Discharge status: Home Health | Payer: Medicare Other | Attending: Internal Medicine | Admitting: Internal Medicine

## 2021-02-04 ENCOUNTER — Ambulatory Visit (INDEPENDENT_AMBULATORY_CARE_PROVIDER_SITE_OTHER): Payer: Medicare Other | Admitting: Internal Medicine

## 2021-02-04 VITALS — BP 119/66 | HR 87 | Temp 98.3°F

## 2021-02-04 DIAGNOSIS — Y838 Other surgical procedures as the cause of abnormal reaction of the patient, or of later complication, without mention of misadventure at the time of the procedure: Secondary | ICD-10-CM | POA: Diagnosis present

## 2021-02-04 DIAGNOSIS — Z89611 Acquired absence of right leg above knee: Secondary | ICD-10-CM

## 2021-02-04 DIAGNOSIS — D649 Anemia, unspecified: Secondary | ICD-10-CM | POA: Diagnosis present

## 2021-02-04 DIAGNOSIS — Z7982 Long term (current) use of aspirin: Secondary | ICD-10-CM

## 2021-02-04 DIAGNOSIS — K219 Gastro-esophageal reflux disease without esophagitis: Secondary | ICD-10-CM

## 2021-02-04 DIAGNOSIS — I739 Peripheral vascular disease, unspecified: Secondary | ICD-10-CM | POA: Diagnosis not present

## 2021-02-04 DIAGNOSIS — Z20822 Contact with and (suspected) exposure to covid-19: Secondary | ICD-10-CM | POA: Diagnosis present

## 2021-02-04 DIAGNOSIS — M7989 Other specified soft tissue disorders: Secondary | ICD-10-CM | POA: Diagnosis present

## 2021-02-04 DIAGNOSIS — I998 Other disorder of circulatory system: Secondary | ICD-10-CM

## 2021-02-04 DIAGNOSIS — I82591 Chronic embolism and thrombosis of other specified deep vein of right lower extremity: Secondary | ICD-10-CM

## 2021-02-04 DIAGNOSIS — Z7901 Long term (current) use of anticoagulants: Secondary | ICD-10-CM

## 2021-02-04 DIAGNOSIS — Z8249 Family history of ischemic heart disease and other diseases of the circulatory system: Secondary | ICD-10-CM

## 2021-02-04 DIAGNOSIS — Z91041 Radiographic dye allergy status: Secondary | ICD-10-CM

## 2021-02-04 DIAGNOSIS — M792 Neuralgia and neuritis, unspecified: Secondary | ICD-10-CM

## 2021-02-04 DIAGNOSIS — I70202 Unspecified atherosclerosis of native arteries of extremities, left leg: Secondary | ICD-10-CM | POA: Diagnosis present

## 2021-02-04 DIAGNOSIS — T8130XA Disruption of wound, unspecified, initial encounter: Principal | ICD-10-CM | POA: Diagnosis present

## 2021-02-04 DIAGNOSIS — G4700 Insomnia, unspecified: Secondary | ICD-10-CM

## 2021-02-04 DIAGNOSIS — G546 Phantom limb syndrome with pain: Secondary | ICD-10-CM | POA: Diagnosis present

## 2021-02-04 DIAGNOSIS — D72829 Elevated white blood cell count, unspecified: Secondary | ICD-10-CM | POA: Diagnosis present

## 2021-02-04 DIAGNOSIS — Z79899 Other long term (current) drug therapy: Secondary | ICD-10-CM

## 2021-02-04 DIAGNOSIS — Z86718 Personal history of other venous thrombosis and embolism: Secondary | ICD-10-CM

## 2021-02-04 DIAGNOSIS — G8918 Other acute postprocedural pain: Secondary | ICD-10-CM

## 2021-02-04 DIAGNOSIS — F32A Depression, unspecified: Secondary | ICD-10-CM | POA: Diagnosis present

## 2021-02-04 LAB — BASIC METABOLIC PANEL
Anion gap: 7 (ref 5–15)
BUN: 11 mg/dL (ref 6–20)
CO2: 26 mmol/L (ref 22–32)
Calcium: 9 mg/dL (ref 8.9–10.3)
Chloride: 107 mmol/L (ref 98–111)
Creatinine, Ser: 1.23 mg/dL (ref 0.61–1.24)
GFR, Estimated: >60 mL/min (ref >60)
Glucose, Bld: 96 mg/dL (ref 70–99)
Potassium: 3.8 mmol/L (ref 3.5–5.1)
Sodium: 140 mmol/L (ref 135–145)

## 2021-02-04 LAB — CBC WITH DIFFERENTIAL/PLATELET
Abs Immature Granulocytes: 0.08 10*3/uL — ABNORMAL HIGH (ref 0.00–0.07)
Basophils Absolute: 0.1 10*3/uL (ref 0.0–0.1)
Basophils Relative: 1 %
Eosinophils Absolute: 0.4 10*3/uL (ref 0.0–0.5)
Eosinophils Relative: 4 %
HCT: 36.9 % — ABNORMAL LOW (ref 39.0–52.0)
Hemoglobin: 11.3 g/dL — ABNORMAL LOW (ref 13.0–17.0)
Immature Granulocytes: 1 %
Lymphocytes Relative: 20 %
Lymphs Abs: 2.1 10*3/uL (ref 0.7–4.0)
MCH: 26.5 pg (ref 26.0–34.0)
MCHC: 30.6 g/dL (ref 30.0–36.0)
MCV: 86.4 fL (ref 80.0–100.0)
Monocytes Absolute: 0.8 10*3/uL (ref 0.1–1.0)
Monocytes Relative: 8 %
Neutro Abs: 6.9 10*3/uL (ref 1.7–7.7)
Neutrophils Relative %: 66 %
Platelets: 281 10*3/uL (ref 150–400)
RBC: 4.27 MIL/uL (ref 4.22–5.81)
RDW: 13.2 % (ref 11.5–15.5)
WBC: 10.3 10*3/uL (ref 4.0–10.5)
nRBC: 0 % (ref 0.0–0.2)

## 2021-02-04 LAB — LACTIC ACID, PLASMA: Lactic Acid, Venous: 2 mmol/L (ref 0.5–1.9)

## 2021-02-04 MED ORDER — OXYCODONE-ACETAMINOPHEN 5-325 MG PO TABS
2.0000 | ORAL_TABLET | Freq: Once | ORAL | Status: AC
  Administered 2021-02-04: 2 via ORAL
  Filled 2021-02-04: qty 2

## 2021-02-04 NOTE — ED Provider Notes (Signed)
Emergency Medicine Provider Triage Evaluation Note  Jose Hansen , a 50 y.o. male  was evaluated in triage.  Pt complains of incision site pain. S/p left femoral bypass graft on 8/24. Has had increased incisional site pain with redness and swelling for several days. S/p BKA right leg  Review of Systems  Positive: Fatigue, incision site pain Negative: Fevers, chills, N/V, numbness, tingling  Physical Exam  BP 127/75 (BP Location: Right Arm)   Pulse 77   Temp 98.4 F (36.9 C)   Resp 14   SpO2 100%  Gen:   Awake, no distress   Resp:  Normal effort  MSK:   Moves extremities without difficulty  Other:  Approximately 12 cm incision with staples to medial left calf with evidence of wound dehiscence   Medical Decision Making  Medically screening exam initiated at 6:01 PM.  Appropriate orders placed.  Jose Hansen was informed that the remainder of the evaluation will be completed by another provider, this initial triage assessment does not replace that evaluation, and the importance of remaining in the ED until their evaluation is complete.     Estill Cotta 02/04/21 1805    Charlesetta Shanks, MD 02/04/21 [phone]

## 2021-02-04 NOTE — Assessment & Plan Note (Deleted)
Assessment:  Patient presented to the clinic for hospital follow up. He stated he was having significant pain in the left leg and it was swelling up. He stated the swelling about three days ago where it was getting bigger and bigger. He stated the oral percocet given to him at discharge helped control his pain. He came to the ED yesterday but left due to long wait times. He endorsed compliance with his eliquis. He had bypass surgery done for left leg ischemia where a prosthetic graft was used. My concern is possibility of that graft failing vs development of new clot. 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 His left foot appeared to be cooler than when he was seen inpatient.   Plan: -Send to ED for admission for DVT rule out and vascular surgery evaluation -Percocet 10 mg given in the clinic; Percocet appears to control patient's pain better than oral Dilaudid.

## 2021-02-04 NOTE — Assessment & Plan Note (Signed)
Assessment:  Patient presented to the clinic for hospital follow up. He stated he was having significant pain in the left leg and it was swelling up. He stated the swelling about three days ago where it was getting bigger and bigger. He stated the oral percocet given to him at discharge helped control his pain. He came to the ED yesterday but left due to long wait times. He endorsed compliance with his eliquis. He had bypass surgery done for left leg ischemia where a prosthetic graft was used. My concern is possibility of that graft failing vs development of new clot. 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 His left foot appeared to be cooler than when he was seen inpatient.   Plan: -Send to ED for admission for DVT rule out and vascular surgery evaluation -Percocet 10 mg given in the clinic; Percocet appears to control patient's pain better than oral Dilaudid.

## 2021-02-04 NOTE — ED Triage Notes (Signed)
C/o left leg swelling + pain. S/p femoral-tibial bypass graft a week ago.

## 2021-02-04 NOTE — Assessment & Plan Note (Signed)
Assessment: Patient reports insomnia and hallucinations if he is not taking Seroquel. He stated he has been diagnosed with Schizophrenia while he was in [facility]. Plan was to refer him to psychiatry today for evaluation but he was sent to the ED for worsening swelling and pain the left leg.   Plan: -Psych referral -Continue Seroquel 150 mg qhs until Psych eval

## 2021-02-04 NOTE — Assessment & Plan Note (Signed)
Assessment: Patient continued to have pain at right extremity after amputation so gabapentin was started. He did not notice any relief of the pain with that and the dose was increased. He still endorsed pain in the right extremity and gabapentin was changed to lyrica. Today he endorsed relieve of the right extremity pain.  Plan: -Continue Lyrica 100 mg TID

## 2021-02-04 NOTE — Progress Notes (Signed)
CC: Hospital Follow up  HPI:  Mr.Alen Z Pacek is a 50 y.o. with medical history as below presenting to [facility] for a hospital follow up. He stated his pain has been worsening after discharge and last few days he has been noticing his left leg getting bigger. He stated he came to the ED yesterday but had to leave due to long wait time. He endorsed taking his Eliquis regularly.   Please see problem-based list for further details, assessments, and plans.  Past Medical History:  Diagnosis Date   H/O blood clots    Review of Systems:    Review of Systems  Constitutional:  Negative for chills and fever.  HENT:  Negative for ear pain, hearing loss, sinus pain, sore throat and tinnitus.   Eyes:  Negative for blurred vision, double vision, pain and discharge.  Respiratory:  Negative for cough, hemoptysis, shortness of breath and wheezing.   Cardiovascular:  Positive for leg swelling (left leg swelling). Negative for chest pain, palpitations and orthopnea.  Gastrointestinal:  Negative for abdominal pain, constipation, diarrhea, heartburn and nausea.  Genitourinary:  Negative for dysuria and urgency.  Musculoskeletal:  Negative for back pain and myalgias.  Skin:  Negative for itching and rash.  Neurological:  Negative for dizziness and headaches.  Psychiatric/Behavioral:  Negative for depression and suicidal ideas.    Physical Exam:  Vitals:   02/04/21 1440 02/04/21 1705  BP: 125/71 119/66  Pulse: 95 87  Temp: 98.3 F (36.8 C)   TempSrc: Oral   SpO2: 97% 100%    Physical Exam Constitutional:      General: He is in acute distress.     Appearance: Normal appearance.  HENT:     Head: Normocephalic and atraumatic.     Right Ear: External ear normal.     Left Ear: External ear normal.     Nose: Nose normal. No rhinorrhea.     Mouth/Throat:     Mouth: Mucous membranes are moist.     Pharynx: Oropharynx is clear.  Eyes:     Extraocular Movements: Extraocular movements intact.      Conjunctiva/sclera: Conjunctivae normal.  Cardiovascular:     Rate and Rhythm: Normal rate and regular rhythm.     Pulses:          Radial pulses are 2+ on the right side and 2+ on the left side.     Heart sounds: Normal heart sounds. No murmur heard.   No friction rub. No gallop.     Comments: Left pedal pulse was not palpable. Patient is s/p arterial bypass due to left leg ischemia Pulmonary:     Effort: Pulmonary effort is normal. No respiratory distress.     Breath sounds: Normal breath sounds. No stridor. No wheezing or rhonchi.  Abdominal:     General: Bowel sounds are normal.     Palpations: Abdomen is soft. There is no mass.     Tenderness: There is no abdominal tenderness.  Musculoskeletal:        General: Swelling (left leg swollen) and tenderness (left leg tender to touch) present. Normal range of motion.     Cervical back: Normal range of motion and neck supple.  Skin:    General: Skin is warm.     Findings: Lesion (pateint has scar from recent bypass) present.     Comments: Patient right left leg was swollen and tender to touch. He had dressing on LLE which was removed. The wound appeared well without any  redness, or irritation. It had small drainage from the wound. Left groin was checked as well and did not show redness, or irritation.   Neurological:     General: No focal deficit present.     Mental Status: He is alert and oriented to person, place, and time.  Psychiatric:        Mood and Affect: Mood normal.        Behavior: Behavior normal.    Assessment & Plan:   See Encounters Tab for problem based charting.  Patient seen with Dr. Odella Aquas, MD

## 2021-02-04 NOTE — Assessment & Plan Note (Signed)
Assessment: Patient complained of heartburn and was placed on Pepcid 20 mg. He states his heartburns have resolved.   Plan: -Continue Pepcid 20 mg daily

## 2021-02-05 ENCOUNTER — Observation Stay (HOSPITAL_BASED_OUTPATIENT_CLINIC_OR_DEPARTMENT_OTHER): Payer: Medicare Other

## 2021-02-05 ENCOUNTER — Other Ambulatory Visit: Payer: Self-pay

## 2021-02-05 ENCOUNTER — Emergency Department (HOSPITAL_COMMUNITY): Payer: Medicare Other

## 2021-02-05 DIAGNOSIS — M7989 Other specified soft tissue disorders: Secondary | ICD-10-CM

## 2021-02-05 DIAGNOSIS — R609 Edema, unspecified: Secondary | ICD-10-CM | POA: Diagnosis not present

## 2021-02-05 DIAGNOSIS — T8130XA Disruption of wound, unspecified, initial encounter: Secondary | ICD-10-CM | POA: Diagnosis present

## 2021-02-05 DIAGNOSIS — G8918 Other acute postprocedural pain: Secondary | ICD-10-CM | POA: Diagnosis not present

## 2021-02-05 DIAGNOSIS — I70229 Atherosclerosis of native arteries of extremities with rest pain, unspecified extremity: Secondary | ICD-10-CM

## 2021-02-05 DIAGNOSIS — Z95828 Presence of other vascular implants and grafts: Secondary | ICD-10-CM

## 2021-02-05 DIAGNOSIS — T8781 Dehiscence of amputation stump: Secondary | ICD-10-CM

## 2021-02-05 LAB — BASIC METABOLIC PANEL
Anion gap: 12 (ref 5–15)
BUN: 11 mg/dL (ref 6–20)
CO2: 24 mmol/L (ref 22–32)
Calcium: 9.4 mg/dL (ref 8.9–10.3)
Chloride: 100 mmol/L (ref 98–111)
Creatinine, Ser: 1.24 mg/dL (ref 0.61–1.24)
GFR, Estimated: >60 mL/min (ref >60)
Glucose, Bld: 137 mg/dL — ABNORMAL HIGH (ref 70–99)
Potassium: 4.4 mmol/L (ref 3.5–5.1)
Sodium: 136 mmol/L (ref 135–145)

## 2021-02-05 LAB — CBC
HCT: 35.6 % — ABNORMAL LOW (ref 39.0–52.0)
Hemoglobin: 11 g/dL — ABNORMAL LOW (ref 13.0–17.0)
MCH: 26.7 pg (ref 26.0–34.0)
MCHC: 30.9 g/dL (ref 30.0–36.0)
MCV: 86.4 fL (ref 80.0–100.0)
Platelets: 295 10*3/uL (ref 150–400)
RBC: 4.12 MIL/uL — ABNORMAL LOW (ref 4.22–5.81)
RDW: 13.2 % (ref 11.5–15.5)
WBC: 8.4 10*3/uL (ref 4.0–10.5)
nRBC: 0 % (ref 0.0–0.2)

## 2021-02-05 LAB — LACTIC ACID, PLASMA: Lactic Acid, Venous: 1.5 mmol/L (ref 0.5–1.9)

## 2021-02-05 LAB — PROTIME-INR
INR: 1 (ref 0.8–1.2)
Prothrombin Time: 13.2 seconds (ref 11.4–15.2)

## 2021-02-05 LAB — SARS CORONAVIRUS 2 (TAT 6-24 HRS): SARS Coronavirus 2: NEGATIVE

## 2021-02-05 MED ORDER — FAMOTIDINE 20 MG PO TABS
20.0000 mg | ORAL_TABLET | Freq: Every day | ORAL | Status: DC
  Administered 2021-02-05 – 2021-02-12 (×7): 20 mg via ORAL
  Filled 2021-02-05 (×7): qty 1

## 2021-02-05 MED ORDER — VANCOMYCIN HCL 1500 MG/300ML IV SOLN
1500.0000 mg | Freq: Once | INTRAVENOUS | Status: AC
  Administered 2021-02-05: 1500 mg via INTRAVENOUS
  Filled 2021-02-05: qty 300

## 2021-02-05 MED ORDER — PREGABALIN 100 MG PO CAPS
100.0000 mg | ORAL_CAPSULE | Freq: Three times a day (TID) | ORAL | Status: DC
  Administered 2021-02-05 – 2021-02-12 (×21): 100 mg via ORAL
  Filled 2021-02-05 (×21): qty 1

## 2021-02-05 MED ORDER — HYDROMORPHONE HCL 1 MG/ML IJ SOLN
1.0000 mg | Freq: Once | INTRAMUSCULAR | Status: AC
  Administered 2021-02-05: 1 mg via INTRAVENOUS
  Filled 2021-02-05: qty 1

## 2021-02-05 MED ORDER — ASPIRIN 81 MG PO CHEW
81.0000 mg | CHEWABLE_TABLET | Freq: Every day | ORAL | Status: DC
  Administered 2021-02-06 – 2021-02-12 (×6): 81 mg via ORAL
  Filled 2021-02-05 (×6): qty 1

## 2021-02-05 MED ORDER — HYDROCORTISONE SOD SUC (PF) 100 MG IJ SOLR
200.0000 mg | Freq: Once | INTRAMUSCULAR | Status: AC
  Administered 2021-02-05: 200 mg via INTRAVENOUS
  Filled 2021-02-05: qty 4

## 2021-02-05 MED ORDER — ASPIRIN EC 81 MG PO TBEC: 81.0000 mg | DELAYED_RELEASE_TABLET | Freq: Every day | ORAL | Status: DC

## 2021-02-05 MED ORDER — QUETIAPINE FUMARATE 50 MG PO TABS
150.0000 mg | ORAL_TABLET | Freq: Every day | ORAL | Status: DC
  Administered 2021-02-05 – 2021-02-11 (×7): 150 mg via ORAL
  Filled 2021-02-05 (×5): qty 3
  Filled 2021-02-05 (×2): qty 6
  Filled 2021-02-05 (×4): qty 3

## 2021-02-05 MED ORDER — ATORVASTATIN CALCIUM 80 MG PO TABS
80.0000 mg | ORAL_TABLET | Freq: Every day | ORAL | Status: DC
  Administered 2021-02-05 – 2021-02-12 (×7): 80 mg via ORAL
  Filled 2021-02-05 (×7): qty 1

## 2021-02-05 MED ORDER — HYDROMORPHONE HCL 1 MG/ML IJ SOLN: 0.5000 mg | INTRAMUSCULAR | Status: DC | PRN

## 2021-02-05 MED ORDER — HEPARIN SODIUM (PORCINE) 5000 UNIT/ML IJ SOLN: 5000.0000 [IU] | Freq: Three times a day (TID) | INTRAMUSCULAR | Status: DC

## 2021-02-05 MED ORDER — HYDROCORTISONE SOD SUC (PF) 250 MG IJ SOLR: 200.0000 mg | Freq: Once | INTRAMUSCULAR | Status: DC

## 2021-02-05 MED ORDER — OXYCODONE HCL 5 MG PO TABS
5.0000 mg | ORAL_TABLET | ORAL | Status: DC | PRN
Start: 2021-02-05 — End: 2021-02-07
  Administered 2021-02-05 – 2021-02-06 (×4): 5 mg via ORAL
  Filled 2021-02-05 (×6): qty 1

## 2021-02-05 MED ORDER — HYDROMORPHONE HCL 1 MG/ML IJ SOLN
1.0000 mg | INTRAMUSCULAR | Status: DC | PRN
  Administered 2021-02-05 – 2021-02-07 (×9): 1 mg via INTRAVENOUS
  Filled 2021-02-05 (×11): qty 1

## 2021-02-05 MED ORDER — PIPERACILLIN-TAZOBACTAM 3.375 G IVPB
3.3750 g | Freq: Three times a day (TID) | INTRAVENOUS | Status: DC
  Administered 2021-02-05 – 2021-02-10 (×14): 3.375 g via INTRAVENOUS
  Filled 2021-02-05 (×14): qty 50

## 2021-02-05 MED ORDER — HYDROMORPHONE HCL 1 MG/ML IJ SOLN
1.0000 mg | Freq: Once | INTRAMUSCULAR | Status: AC
Start: 2021-02-05 — End: 2021-02-05
  Administered 2021-02-05: 1 mg via INTRAVENOUS
  Filled 2021-02-05: qty 1

## 2021-02-05 MED ORDER — HYDROMORPHONE HCL 1 MG/ML IJ SOLN
0.5000 mg | INTRAMUSCULAR | Status: DC | PRN
  Administered 2021-02-05: 0.5 mg via INTRAVENOUS
  Filled 2021-02-05: qty 1

## 2021-02-05 MED ORDER — ACETAMINOPHEN 650 MG RE SUPP: 650.0000 mg | Freq: Four times a day (QID) | RECTAL | Status: DC | PRN

## 2021-02-05 MED ORDER — IOHEXOL 350 MG/ML SOLN
100.0000 mL | Freq: Once | INTRAVENOUS | Status: AC | PRN
  Administered 2021-02-05: 100 mL via INTRAVENOUS

## 2021-02-05 MED ORDER — DULOXETINE HCL 60 MG PO CPEP
60.0000 mg | ORAL_CAPSULE | Freq: Every day | ORAL | Status: DC
  Administered 2021-02-05 – 2021-02-12 (×7): 60 mg via ORAL
  Filled 2021-02-05 (×7): qty 1

## 2021-02-05 MED ORDER — DIPHENHYDRAMINE HCL 25 MG PO CAPS: 50.0000 mg | ORAL_CAPSULE | Freq: Once | ORAL | Status: AC

## 2021-02-05 MED ORDER — POLYETHYLENE GLYCOL 3350 17 G PO PACK: 17.0000 g | PACK | Freq: Every day | ORAL | Status: DC | PRN

## 2021-02-05 MED ORDER — SODIUM CHLORIDE 0.9 % IV SOLN
1.0000 g | INTRAVENOUS | Status: DC
  Administered 2021-02-05: 1 g via INTRAVENOUS
  Filled 2021-02-05: qty 10

## 2021-02-05 MED ORDER — DIPHENHYDRAMINE HCL 50 MG/ML IJ SOLN
50.0000 mg | Freq: Once | INTRAMUSCULAR | Status: AC
  Administered 2021-02-05: 50 mg via INTRAVENOUS
  Filled 2021-02-05: qty 1

## 2021-02-05 MED ORDER — ACETAMINOPHEN 325 MG PO TABS: 650.0000 mg | ORAL_TABLET | Freq: Four times a day (QID) | ORAL | Status: DC | PRN

## 2021-02-05 MED ORDER — APIXABAN 5 MG PO TABS
5.0000 mg | ORAL_TABLET | Freq: Two times a day (BID) | ORAL | Status: DC
  Administered 2021-02-05 – 2021-02-08 (×6): 5 mg via ORAL
  Filled 2021-02-05 (×6): qty 1

## 2021-02-05 MED ORDER — ACETAMINOPHEN 325 MG PO TABS
650.0000 mg | ORAL_TABLET | Freq: Four times a day (QID) | ORAL | Status: DC
  Administered 2021-02-05 – 2021-02-12 (×26): 650 mg via ORAL
  Filled 2021-02-05 (×28): qty 2

## 2021-02-05 MED ORDER — VANCOMYCIN HCL 750 MG/150ML IV SOLN
750.0000 mg | Freq: Three times a day (TID) | INTRAVENOUS | Status: DC
  Administered 2021-02-05 – 2021-02-10 (×13): 750 mg via INTRAVENOUS
  Filled 2021-02-05 (×15): qty 150

## 2021-02-05 NOTE — ED Notes (Signed)
Patient resting in stretcher comfortably. Denies any needs at this time. Call bell within reach. Stretcher in low and locked position. Side rails up x2.  

## 2021-02-05 NOTE — ED Notes (Signed)
Report attempted.  Number left with Network engineer.  RN to return call in a few or I will call back in a bit.

## 2021-02-05 NOTE — ED Notes (Signed)
Pt alert, NAD, calm, interactive, resps e/u, using phone, lights dimmed per request.

## 2021-02-05 NOTE — ED Notes (Signed)
Eating lunch,pain med given, ABT initiated.

## 2021-02-05 NOTE — ED Notes (Signed)
Patient transported to Ultrasound 

## 2021-02-05 NOTE — H&P (Addendum)
Date: 02/05/2021               Patient Name:  Jose Hansen MRN: [id number]  DOB: Mar 16, 1971 Age / Sex: 50 y.o., male   PCP: [name], MD         Medical Service: Internal Medicine Teaching Service         Attending Physician: Dr. Velna Ochs, MD    First Contact: Farrel Gordon, DO Pager: [phone]  Second Contact: Rick Duff Pager: [phone]       After Hours (After 5p/  First Contact Pager: [phone]  weekends / holidays): Second Contact Pager: [phone]   SUBJECTIVE   Chief Complaint: Left lower leg swelling and pain  History of Present Illness:  Patient is a 50 year old man with severe PVD s/p multiple vascular interventions and right AKA, GERD, depression, insomnia with auditory hallucinations, and a recent surgical bypass for critical Limb ischemia 2/2 to PAD on 01/20/21 is presenting with increased leg swelling and pain. Not exactly sure when swelling started but it has been worse the last 3 days. Saw IMTS yesterday morning who sent patient to ED but due to long wait patient went home. Pain not improved so he came back today. He described the left lower extremity as "dead" and had a lack of sensation, but does endorse pain at this time. He describes the pain as an electric shock. The pain is from his groin down to his foot and it located in the "front of his leg." He has a history of chronic left lower extremity pain, but since the swelling of the lower leg, the pain has been the worst it has ever been. Has had persistent drainage from his wound that has been bloody at times. He endorses it soaking his pants at times. Says that he has to change the dressings due to drainage twice a day. The drainage from his incision in his calf does not smell but the drainage coming from his groin incision has had an odor to it. Describes some twitching of his legs that has been present for some time. The twitching was going on longer than the new pain and swelling. Has not had any  improvement in the swelling. No inciting events prior, no trauma to the area. Denies missing any medications. Denies fever, chills, cough, chest pain, shortness of breath, n/v/d. Does have increasing fatigue.    ED Course: Patient had a CT angio of his leg that showed patent blood flow to his lower extremity and doppler pulses of his PT DP were present. However the CT did also show new fluid collections in the proximal thigh and a smaller one in his mid calf.  Meds:  Tylenol 650 mg q6h Aspirin 81 mg daily Atorvastatin 80 mg daily Cymbalta 60 mg daily Eliquis 5 mg BID Famotidine 20 mg daily Oxycodone 15 q4h Pregabalin 100 mg TID Quetiapine 150 mg daily  Past Medical History:  Diagnosis Date   Atypical chest pain 01/13/2021   H/O blood clots    Left leg weakness 11/24/2020    Past Surgical History:  Procedure Laterality Date   ABDOMINAL AORTOGRAM W/LOWER EXTREMITY N/A 01/18/2021   Procedure: ABDOMINAL AORTOGRAM W/LOWER EXTREMITY;  Surgeon: Waynetta Sandy, MD;  Location: [facility];  Service: Cardiovascular;  Laterality: N/A;   BELOW KNEE LEG AMPUTATION Right 2018   FEMORAL-TIBIAL BYPASS GRAFT Left 01/20/2021   Procedure: BYPASS GRAFT FEMORAL-PT ARTERY LEFT USING 6 MM X 80 CM GORE PROPATEN VASCULAR GRAFT  REMOVABLE RING;  Surgeon: Waynetta Sandy, MD;  Location: [facility] OR;  Service: Vascular;  Laterality: Left;    Social:  Lives With: Family Occupation: Retired Support: Family, had home health and OT at home.  Level of Function: Uses wheelchair PCP: [name] MD Substances: Denies tobacco, alcohol, or other non-prescription drugs at this time. Last used marijuana three months ago. Denies IV drug use.   Family History:  Extensive cardiac history on paternal side, heart failure and MI's. Patient unsure of family member ages when episodes occur.   Allergies: Allergies as of 02/04/2021 - Review Complete 02/04/2021  Allergen Reaction Noted   Contrast media  [iodinated diagnostic agents] Nausea Only 12/04/2020    Review of Systems: A complete ROS was negative except as per HPI.   OBJECTIVE:   Physical Exam: Blood pressure 131/79, pulse 76, temperature 98.2 F (36.8 C), temperature source Oral, resp. rate 15, SpO2 97 %.  Constitutional: WNWD man resting in bed in NAD HENT: normocephalic atraumatic, mucous membranes moist Eyes: conjunctiva non-erythematous Neck: supple Cardiovascular: regular rate and rhythm, no m/r/g Pulmonary/Chest: normal work of breathing on room air, lungs clear to auscultation bilaterally Abdominal: soft, non-tender, distended, bowel sounds present MSK: Right leg: AKA Left leg: long healed scar along the medial thigh, new healed incision in the inguinal area with palpable firm subcutaneous nodular swelling Incision with staples in the lower leg with minimal clear drainage DP and PT pulses present with doppler, PT stronger than DP Able to dorsiflex and plantar flex toes, unable to lift leg against gravity Diffusely tender to palpation Neurological: alert & answering questions appropriately, normal sensation Skin: warm and dry Psych: normal affect  Labs: CBC    Component Value Date/Time   WBC 8.4 02/05/2021 0512   RBC 4.12 (L) 02/05/2021 0512   HGB 11.0 (L) 02/05/2021 0512   HGB 15.4 12/03/2020 1202   HCT 35.6 (L) 02/05/2021 0512   HCT 47.0 12/03/2020 1202   PLT 295 02/05/2021 0512   PLT 288 12/03/2020 1202   MCV 86.4 02/05/2021 0512   MCV 81 12/03/2020 1202   MCH 26.7 02/05/2021 0512   MCHC 30.9 02/05/2021 0512   RDW 13.2 02/05/2021 0512   RDW 13.5 12/03/2020 1202   LYMPHSABS 2.1 02/04/2021 1814   LYMPHSABS 3.0 12/03/2020 1202   MONOABS 0.8 02/04/2021 1814   EOSABS 0.4 02/04/2021 1814   EOSABS 0.4 12/03/2020 1202   BASOSABS 0.1 02/04/2021 1814   BASOSABS 0.1 12/03/2020 1202     CMP     Component Value Date/Time   NA 136 02/05/2021 0512   NA 145 (H) 12/03/2020 1202   K 4.4 02/05/2021 0512    CL 100 02/05/2021 0512   CO2 24 02/05/2021 0512   GLUCOSE 137 (H) 02/05/2021 0512   BUN 11 02/05/2021 0512   BUN 11 12/03/2020 1202   CREATININE 1.24 02/05/2021 0512   CALCIUM 9.4 02/05/2021 0512   PROT 6.7 01/25/2021 0207   ALBUMIN 3.1 (L) 01/25/2021 0207   AST 28 01/25/2021 0207   ALT 59 (H) 01/25/2021 0207   ALKPHOS 83 01/25/2021 0207   BILITOT 0.6 01/25/2021 0207   GFRNONAA >60 02/05/2021 0512    Imaging: CT ANGIO LOW EXTREM RIGHT W &/OR WO CONTRAST  Result Date: 02/05/2021 CLINICAL DATA:  Arterial embolism lower extremity EXAM: CT ANGIOGRAPHY OF THE LEFT LOWEREXTREMITY TECHNIQUE: Multidetector CT imaging of the left lower extremity was performed using the standard protocol during bolus administration of intravenous contrast. Multiplanar CT image reconstructions and  MIPs were obtained to evaluate the vascular anatomy. CONTRAST:  173m OMNIPAQUE IOHEXOL 350 MG/ML SOLN COMPARISON:  None. FINDINGS: Stent or stent graft noted in the distal aorta continuing into the left common iliac artery. Postoperative changes in the left groin with fluid collection adjacent to the proximal left lower extremity graft. There is occlusion of the native superficial femoral artery in the proximal thigh. Profunda femoris branches remain patent. Graft noted from the left common femoral artery which ties in in the left calf to the posterior tibial artery, which is the dominant runoff. Graft is widely patent. Posterior tibial artery below the graft tie in is patent to the ankle. Fluid collection adjacent to the proximal graft in the left groin. Small fluid collection noted in the left calf at the tight and with the posterior tibial artery. There is edema throughout the subcutaneous soft tissues in the left lower extremity. Review of the MIP images confirms the above findings. IMPRESSION: Patent inflow to the left lower extremity. Patent left fem-tib bypass graft which ties into the posterior tibial artery in the mid left  calf. Posterior tibial artery is patent to the ankle. Electronically Signed   By: KRolm BaptiseM.D.   On: 02/05/2021 02:44    EKG: pending   ASSESSMENT & PLAN:    Assessment & Plan by Problem: Active Problems:   Leg swelling   Thadius ZJORGELUIS MUKESis a 50y.o. with severe PVD s/p multiple vascular interventions and right AKA, GERD, depression, insomnia with auditory hallucinations, and a recent surgical bypass for critical Limb ischemia 2/2 to PAD on 01/20/21 is presenting with increased leg swelling and pain.  #Left Leg Swelling and Pain # Left common femoral to posterior tibial artery bypass with prosthetic graft on 8/24 Vasculature/graft found to be patent on CT angio and pulses present with doppler. However new fluid collections in patient's groin and calf raise concern for graft leak or bleed. Patient notes history of bloody drainage from wound. ED physician spoke with vascular surgery who plans to see the patient this morning. Patient's vital signs are stable and is not septic, afebrile, normotensive, and HR normal, and normal O2 saturation. WBC normal. Cannot rule out infection for source of fluid collections but do not think antibiotics are warranted at this time.  -holding anticoagulation 2/2 concern for acute bleed -lactic acid elevated to 2, on repeat 1.5 -f/u vascular surgery recs, greatly appreciate assistance -Hgb stable at 10.3 -daily CBC -NPO for possible procedure -dilaudid 0.5 Q4 PRN, tylenol Q6  -continued home lyrica 100 mg TID  #PAD, Multiple prior vascular procedures  #Right AKA #Right common femoral DVT Patient with severe PAD with [place] prior [place] of state including previous vein harvest of left leg - holding eliquis as above - NPO at this time, continue atorvastatin 80 mg once able  #Normocytic Anemia Patient's hemoglobin 11.3, stable from previous admission between 10-11. Due to concern for hematoma/graft leak hemoglobin should be followed  closely -daily cbc -transfuse <7  #GERD -Pepcid '20mg'$  daily -Maloox/Mylanta prn   #Chronic depression - Continue home cymbalta 60 mh daily  - continue Seroquel 150 QHS   Diet: NPO VTE: None IVF: None,None Code: Full  Prior to Admission Living Arrangement: Home, living with family Anticipated Discharge Location: Home Barriers to Discharge: Continued medical workup of leg swelling  Dispo: Admit patient to Observation with expected length of stay less than 2 midnights.  Signed: DScarlett Presto MD Internal Medicine Resident PGY-1 Pager: [phone] 02/05/2021, 6:00 AM

## 2021-02-05 NOTE — Progress Notes (Addendum)
Pharmacy Antibiotic Note  Jose Hansen is a 50 y.o. male admitted on 02/04/2021 with  swelling and drainage from lower extremity bypass incision .  Pharmacy has been consulted for Vancomycin and Zosyn dosing.  WBC 8.4, afebrile SCr 1.24 - baseline ~1-1.2  Plan: Zosyn 3.375 gm every 8 hours Vancomycin 1500 mg IV x 1, then 750 mg IV every 8 hours (eAUC 498, Goal AUC 400-550, SCr 1.24) Monitor renal function, CBC, Cx and VVS recs Vancomycin levels as needed De-escalate as able  Temp (24hrs), Avg:98.3 F (36.8 C), Min:98.2 F (36.8 C), Max:98.4 F (36.9 C)  Recent Labs  Lab 02/04/21 1814 02/05/21 0120 02/05/21 0512  WBC 10.3  --  8.4  CREATININE 1.23  --  1.24  LATICACIDVEN 2.0* 1.5  --     CrCl cannot be calculated (Unknown ideal weight.).    Allergies  Allergen Reactions   Contrast Media [Iodinated Diagnostic Agents] Nausea Only    NOT AN ALLERGY. ONLY NAUSEA. 12/11/20  Newly reported by [facility] on 12/04/20.    Antimicrobials this admission: CTX 9/9 x1 in ED Vancomycin 9/9 >> Zosyn 9/9 >>  Dose adjustments this admission: none  Microbiology results: none  Thank you for allowing pharmacy to be a part of this patient's care.  Laurey Arrow, PharmD PGY1 Pharmacy Resident 02/05/2021  3:33 PM  Please check [email or web address] for unit-specific pharmacy phone numbers.

## 2021-02-05 NOTE — Progress Notes (Signed)
ABI and lower extremity venous has been completed.   Preliminary results in CV Proc.   Jinny Blossom Marlynn Hinckley 02/05/2021 8:48 AM

## 2021-02-05 NOTE — ED Notes (Signed)
Up to b/r by w/c, vanc infusing.

## 2021-02-05 NOTE — Consult Note (Addendum)
Hospital Consult    Reason for Consult:  LLE pain Requesting Physician:  Dr. Ileene Musa MRN #:  [id number]  History of Present Illness: This is a 50 y.o. male presents with swelling, pain and drainage from left lower leg incision. He states he had a brief period post-operatively when his rest pain resolved, but a few days post-op he lost his balance while on toilet and fell backward. Since then his left leg has been painful. He also has chronic RLE phantom pain. He denies fever, chills, N or V. He making good urine.  He is s/p left common femoral to posterior tibial artery bypass with 6 mm ringed PTFE on 01/20/2021 by Dr. Donzetta Matters. He has history of right above-knee amputation.  He has previous left greater saphenous vein harvest for the right lower extremity and the wound failed to heal required healing by secondary intention.  He also has left common iliac artery and aortic stenting.  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    Compliant with asa and statin. Not diabetic.  Past Medical History:  Diagnosis Date   Atypical chest pain 01/13/2021   H/O blood clots    Left leg weakness 11/24/2020    Past Surgical History:  Procedure Laterality Date   ABDOMINAL AORTOGRAM W/LOWER EXTREMITY N/A 01/18/2021   Procedure: ABDOMINAL AORTOGRAM W/LOWER EXTREMITY;  Surgeon: Waynetta Sandy, MD;  Location: [place] [facility];  Service: Cardiovascular;  Laterality: N/A;   BELOW KNEE LEG AMPUTATION Right 2018   FEMORAL-TIBIAL BYPASS GRAFT Left 01/20/2021   Procedure: BYPASS GRAFT FEMORAL-PT ARTERY LEFT USING 6 MM X 80 CM GORE PROPATEN VASCULAR GRAFT REMOVABLE RING;  Surgeon: Waynetta Sandy, MD;  Location: [place];  Service: Vascular;  Laterality: Left;    Allergies  Allergen Reactions   Contrast Media [Iodinated Diagnostic Agents] Nausea Only    NOT AN ALLERGY. ONLY  NAUSEA. 12/11/20  Newly reported by [facility] on 12/04/20.    Prior to Admission medications   Medication Sig Start Date End Date Taking? Authorizing Provider  acetaminophen (TYLENOL) 325 MG tablet Take 2 tablets (650 mg total) by mouth every 6 (six) hours as needed for moderate pain or mild pain. 01/26/21   [name], MD  apixaban (ELIQUIS) 5 MG TABS tablet Take 1 tablet (5 mg total) by mouth 2 (two) times daily. 01/08/21   Masters, Joellen Jersey, DO  aspirin EC 81 MG tablet Take 81 mg by mouth daily. Swallow whole.    [provider]  atorvastatin (LIPITOR) 80 MG tablet Take 1 tablet (80 mg total) by mouth daily. 01/08/21 04/08/21  Masters, Katie, DO  DULoxetine (CYMBALTA) 60 MG capsule Take 1 capsule (60 mg total) by mouth daily. 01/08/21   Masters, Katie, DO  famotidine (PEPCID) 20 MG tablet Take 1 tablet (20 mg total) by mouth daily. 01/27/21 02/26/21  [name], MD  pregabalin (LYRICA) 100 MG capsule Take 1 capsule (100 mg total) by mouth 3 (three) times daily. 01/26/21 02/25/21  [name], MD  QUEtiapine (SEROQUEL) 50 MG tablet Take 3 tablets (150 mg total) by mouth at bedtime. 01/08/21   Masters, Joellen Jersey, DO    Social History   Socioeconomic History   Marital status: Single    Spouse name: Not on file   Number of children: Not on file   Years of education: Not on file   Highest education level: Not on file  Occupational History   Not on file  Tobacco Use   Smoking status: Former    Types: Cigarettes   Smokeless tobacco: Never  Vaping Use   Vaping Use: Never used  Substance and Sexual Activity   Alcohol use: Not Currently   Drug use: Not Currently   Sexual activity: Yes  Other Topics Concern   Not on file  Social History Narrative   Not on file   Social Determinants of Health   Financial Resource Strain: Not on file  Food Insecurity: Not on file  Transportation Needs: Not on file  Physical Activity: Not on file  Stress: Not on file  Social Connections:  Not on file  Intimate Partner Violence: Not on file     Family History  Problem Relation Age of Onset   CAD Neg Hx    Hypertension Neg Hx     ROS: '[x]'$  Positive   '[ ]'$  Negative   '[ ]'$  All sytems reviewed and are negative  Cardiac: '[]'$  chest pain/pressure '[]'$  palpitations '[]'$  SOB lying flat '[]'$  DOE  Vascular: '[]'$  pain in legs while walking '[]'$ x pain in legs at rest '[]'$  pain in legs at night '[]'$  non-healing ulcers '[]'$  hx of DVT '[]'$ x swelling in legs  Pulmonary: '[]'$  productive cough '[]'$  asthma/wheezing '[]'$  home O2  Neurologic: '[]'$  weakness in '[]'$  arms '[]'$  legs '[]'$  numbness in '[]'$  arms '[]'$  legs '[]'$  hx of CVA '[]'$  mini stroke '[]'$ difficulty speaking or slurred speech '[]'$  temporary loss of vision in one eye '[]'$  dizziness  Hematologic: '[]'$  hx of cancer '[]'$  bleeding problems '[]'$  problems with blood clotting easily  Endocrine:   '[]'$  diabetes '[]'$  thyroid disease  GI '[]'$  vomiting blood '[]'$  blood in stool  GU: '[]'$  CKD/renal failure '[]'$  HD--'[]'$  M/W/F or '[]'$  T/T/S '[]'$  burning with urination '[]'$  blood in urine  Psychiatric: '[]'$  anxiety '[]'$  depression  Musculoskeletal: '[]'$  arthritis '[]'$  joint pain  Integumentary: '[]'$  rashes '[]'$  ulcers  Constitutional: '[]'$  fever '[]'$  chills   Physical Examination  Vitals:   02/05/21 0745 02/05/21 0800  BP: 131/75 131/73  Pulse: 78 81  Resp: 15 16  Temp:    SpO2: 97% 98%   There is no height or weight on file to calculate BMI.  General:  WDWN in NAD Gait: Not observed HENT: WNL, normocephalic Pulmonary: normal non-labored breathing, without Rales, rhonchi,  wheezing Cardiac: regular,  Abdomen:  soft, NT/ND,  Skin: without rashes Vascular Exam/Pulses: Palpable left femoral artery. Brisk left DP, PT and peroneal Doppler signals. Extremities: without ischemic changes, left foot is warm with intact motor function and sensation. Left medial fasciotomy incision has some areas draining SS fluid through staples lines. Approx 1-2 cm skin separation superiorly (see  photo) Left groin incision is healing without signs of infection Musculoskeletal: no muscle wasting or atrophy  Neurologic: A&O X 3;  No focal weakness or paresthesias are detected; speech is fluent/normal Psychiatric:  The pt has Normal affect.  CBC    Component Value Date/Time   WBC 8.4 02/05/2021 0512   RBC 4.12 (L) 02/05/2021 0512   HGB 11.0 (L) 02/05/2021 0512   HGB 15.4 12/03/2020 1202   HCT 35.6 (L) 02/05/2021 0512   HCT 47.0 12/03/2020 1202   PLT 295 02/05/2021 0512   PLT 288 12/03/2020 1202   MCV 86.4 02/05/2021 0512   MCV 81 12/03/2020 1202   MCH 26.7 02/05/2021 0512   MCHC 30.9 02/05/2021 0512   RDW 13.2 02/05/2021 0512   RDW 13.5 12/03/2020 1202  LYMPHSABS 2.1 02/04/2021 1814   LYMPHSABS 3.0 12/03/2020 1202   MONOABS 0.8 02/04/2021 1814   EOSABS 0.4 02/04/2021 1814   EOSABS 0.4 12/03/2020 1202   BASOSABS 0.1 02/04/2021 1814   BASOSABS 0.1 12/03/2020 1202    BMET    Component Value Date/Time   NA 136 02/05/2021 0512   NA 145 (H) 12/03/2020 1202   K 4.4 02/05/2021 0512   CL 100 02/05/2021 0512   CO2 24 02/05/2021 0512   GLUCOSE 137 (H) 02/05/2021 0512   BUN 11 02/05/2021 0512   BUN 11 12/03/2020 1202   CREATININE 1.24 02/05/2021 0512   CALCIUM 9.4 02/05/2021 0512   GFRNONAA >60 02/05/2021 0512    COAGS: Lab Results  Component Value Date   INR 1.0 02/05/2021   INR 1.0 01/12/2021   INR 1.0 11/24/2020     Non-Invasive Vascular Imaging:   CTA RLE 02/05/2021 FINDINGS: Stent or stent graft noted in the distal aorta continuing into the left common iliac artery. Postoperative changes in the left groin with fluid collection adjacent to the proximal left lower extremity graft. There is occlusion of the native superficial femoral artery in the proximal thigh. Profunda femoris branches remain patent. Graft noted from the left common femoral artery which ties in in the left calf to the posterior tibial artery, which is the dominant runoff. Graft is widely  patent. Posterior tibial artery below the graft tie in is patent to the ankle.   Fluid collection adjacent to the proximal graft in the left groin. Small fluid collection noted in the left calf at the tight and with the posterior tibial artery. There is edema throughout the subcutaneous soft tissues in the left lower extremity.   Review of the MIP images confirms the above findings.   IMPRESSION: Patent inflow to the left lower extremity. Patent left fem-tib bypass graft which ties into the posterior tibial artery in the mid left calf. Posterior tibial artery is patent to the ankle.     Electronically Signed   By: Rolm Baptise M.D.   On: 02/05/2021 02:44   RLE venous duplex Summary:  RIGHT:  - No evidence of common femoral vein obstruction.     LEFT:  - There is no evidence of deep vein thrombosis in the lower extremity.  However, portions of this examination were limited- see technologist  comments above.     - Fluid noted in the groin area- confirmed on CTA     *See table(s) above for measurements and observations.    Preliminary   ABIs  ABI Findings:  +--------+------------------+-----+---------+--------+  Right   Rt Pressure (mmHg)IndexWaveform Comment   +--------+------------------+-----+---------+--------+  [id number]                    triphasic          +--------+------------------+-----+---------+--------+   +---------+------------------+-----+---------+-------+  Left     Lt Pressure (mmHg)IndexWaveform Comment  +---------+------------------+-----+---------+-------+  Brachial 144                    triphasic         +---------+------------------+-----+---------+-------+  PTA      163               1.05 triphasic         +---------+------------------+-----+---------+-------+  DP       151               0.97 triphasic         +---------+------------------+-----+---------+-------+  Great Toe97                0.63                    +---------+------------------+-----+---------+-------+     Summary:  Right:   AKA.  Left: Resting left ankle-brachial index is within normal range. No  evidence of significant left lower extremity arterial disease.       ASSESSMENT/PLAN: This is a 49 y.o. male with post-operative pain and edema of left lower extremity. S/p left CFA to PTA bypass POD 16.  Small area of skin separation of lower leg incision and SS drainage. Does not apprear to need immediate washout. Rec: empiric antibiotics as he has PTFE graft for conduit. Will discuss with Dr. Donzetta Matters and Stanford Breed removing several surgical staples and inspecting wound further.  He is afebrile with normal WBC count.  -Risa Grill, PA-C [facility] [phone]   I have independently interviewed and examined patient and agree with PA assessment and plan above.  Without 3 staples of the medial left leg incision need to be removed.  Nothing appears grossly infected.  I did discuss with the patient the risk of infection which would require complete graft excision.  At this time we will continue with antibiotics.  Price Lachapelle C. Donzetta Matters, MD [facility] [place] Office: [phone] Pager: [phone]

## 2021-02-05 NOTE — ED Notes (Signed)
Tele-transport in

## 2021-02-05 NOTE — ED Provider Notes (Signed)
[facility] EMERGENCY DEPARTMENT Provider Note   CSN: [id number] Arrival date & time: 02/04/21  1712     History Chief Complaint  Patient presents with   Post-op Problem    Jose Hansen is a 50 y.o. male.  Received a Left common femoral to posterior tibial artery bypass with 6 mm ringed PTFE on august 24. D/c about a week later. Has worsening pain and swelling over last 3 days in area of PT incision and less so in groin area. Pain in foot as well. Went to IM hospital follow up earlier today and sent him here for eval.        Past Medical History:  Diagnosis Date   Atypical chest pain 01/13/2021   H/O blood clots    Left leg weakness 11/24/2020    Patient Active Problem List   Diagnosis Date Noted   GERD (gastroesophageal reflux disease) 02/04/2021   Ischemia of extremity 01/14/2021   Hx of AKA (above knee amputation), right ([place]) 01/13/2021   Insomnia 01/08/2021   Right arm pain 12/03/2020   Depression 12/03/2020   Prediabetes 12/03/2020   DVT (deep venous thrombosis) ([place]) 11/24/2020   Essential hypertension 11/24/2020   PVD (peripheral vascular disease) ([place]) 11/24/2020   Hyperlipidemia 11/24/2020    Past Surgical History:  Procedure Laterality Date   ABDOMINAL AORTOGRAM W/LOWER EXTREMITY N/A 01/18/2021   Procedure: ABDOMINAL AORTOGRAM W/LOWER EXTREMITY;  Surgeon: Waynetta Sandy, MD;  Location: [place] [facility];  Service: Cardiovascular;  Laterality: N/A;   BELOW KNEE LEG AMPUTATION Right 2018   FEMORAL-TIBIAL BYPASS GRAFT Left 01/20/2021   Procedure: BYPASS GRAFT FEMORAL-PT ARTERY LEFT USING 6 MM X 80 CM GORE PROPATEN VASCULAR GRAFT REMOVABLE RING;  Surgeon: Waynetta Sandy, MD;  Location: [place];  Service: Vascular;  Laterality: Left;       Family History  Problem Relation Age of Onset   CAD Neg Hx    Hypertension Neg Hx     Social History   Tobacco Use   Smoking status: Former    Types: Cigarettes    Smokeless tobacco: Never  Vaping Use   Vaping Use: Never used  Substance Use Topics   Alcohol use: Not Currently   Drug use: Not Currently    Home Medications Prior to Admission medications   Medication Sig Start Date End Date Taking? Authorizing Provider  acetaminophen (TYLENOL) 325 MG tablet Take 2 tablets (650 mg total) by mouth every 6 (six) hours as needed for moderate pain or mild pain. 01/26/21   [name], MD  apixaban (ELIQUIS) 5 MG TABS tablet Take 1 tablet (5 mg total) by mouth 2 (two) times daily. 01/08/21   Masters, Joellen Jersey, DO  aspirin EC 81 MG tablet Take 81 mg by mouth daily. Swallow whole.    [provider]  atorvastatin (LIPITOR) 80 MG tablet Take 1 tablet (80 mg total) by mouth daily. 01/08/21 04/08/21  Masters, Katie, DO  DULoxetine (CYMBALTA) 60 MG capsule Take 1 capsule (60 mg total) by mouth daily. 01/08/21   Masters, Katie, DO  famotidine (PEPCID) 20 MG tablet Take 1 tablet (20 mg total) by mouth daily. 01/27/21 02/26/21  [name], MD  pregabalin (LYRICA) 100 MG capsule Take 1 capsule (100 mg total) by mouth 3 (three) times daily. 01/26/21 02/25/21  [name], MD  QUEtiapine (SEROQUEL) 50 MG tablet Take 3 tablets (150 mg total) by mouth at bedtime. 01/08/21   Masters, Joellen Jersey, DO    Allergies  Contrast media [iodinated diagnostic agents]  Review of Systems   Review of Systems  All other systems reviewed and are negative.  Physical Exam Updated Vital Signs BP 133/80 (BP Location: Left Arm)   Pulse 75   Temp 98.4 F (36.9 C)   Resp 18   SpO2 100%   Physical Exam Vitals and nursing note reviewed.  Constitutional:      Appearance: He is well-developed.  HENT:     Head: Normocephalic and atraumatic.  Eyes:     Pupils: Pupils are equal, round, and reactive to light.  Cardiovascular:     Rate and Rhythm: Normal rate.  Pulmonary:     Effort: Pulmonary effort is normal. No respiratory distress.  Abdominal:     General: Abdomen is flat. There  is no distension.  Musculoskeletal:        General: Normal range of motion.     Cervical back: Normal range of motion.  Skin:    General: Skin is warm and dry.     Comments: Multiple scars on left lower extremity. Small area of dehiscence to proxcimal tibial wound, rest of LLE is significantly edematous as well. Pain to touch. PT pulse is dopplerable.   Neurological:     General: No focal deficit present.     Mental Status: He is alert.    ED Results / Procedures / Treatments   Labs (all labs ordered are listed, but only abnormal results are displayed) Labs Reviewed  CBC WITH DIFFERENTIAL/PLATELET - Abnormal; Notable for the following components:      Result Value   Hemoglobin 11.3 (*)    HCT 36.9 (*)    Abs Immature Granulocytes 0.08 (*)    All other components within normal limits  LACTIC ACID, PLASMA - Abnormal; Notable for the following components:   Lactic Acid, Venous 2.0 (*)    All other components within normal limits  BASIC METABOLIC PANEL  LACTIC ACID, PLASMA    EKG None  Radiology No results found.  Procedures Procedures   Medications Ordered in ED Medications  HYDROmorphone (DILAUDID) injection 1 mg (has no administration in time range)  diphenhydrAMINE (BENADRYL) capsule 50 mg (has no administration in time range)    Or  diphenhydrAMINE (BENADRYL) injection 50 mg (has no administration in time range)  hydrocortisone sodium succinate (SOLU-CORTEF) 100 MG injection 200 mg (has no administration in time range)    ED Course  I have reviewed the triage vital signs and the nursing notes.  Pertinent labs & imaging results that were available during my care of the patient were reviewed by me and considered in my medical decision making (see chart for details).    MDM Rules/Calculators/A&P                         Occluded graft or other complication. Possibly DVT but is compliant on eliquis. Will treat pain. Get CTA. D/w vascular PRN.   D/w vascular  surgery. Recommended [place] of venous and graft/arterial structutres. Will see for consultation. D/w medicine for admission.   Final Clinical Impression(s) / ED Diagnoses Final diagnoses:  Wound dehiscence  Post-op pain    Rx / DC Orders ED Discharge Orders     None        Benford Asch, Corene Cornea, MD 02/06/21 [phone]

## 2021-02-05 NOTE — Progress Notes (Signed)
HD#0 SUBJECTIVE:  Patient Summary: Jose Hansen is a 50 y.o. with a pertinent PMH of PVD s/p multiple vascular interventions, AK, GERD, depression, insomnia with auditory hallucinations, and recent surgical bypass for critical limb ischemia 2/2 PAD 01/20/2021, who presented with LLE pain and swelling and admitted for post-op complications s/p LLE surgical bypass for critical limb ischemia 2/2 PAD.   Overnight Events: None  Interim History: Jose Hansen was examined at bedside. He endorses pain in the LLE but has no complaints otherwise.   OBJECTIVE:  Vital Signs: Vitals:   02/05/21 0300 02/05/21 0745 02/05/21 0800 02/05/21 1025  BP: 132/82 131/75 131/73 (!) 123/107  Pulse: 91 78 81 88  Resp: '16 15 16 18  '$ Temp:      TempSrc:      SpO2: 94% 97% 98% 99%   Supplemental O2: Room Air SpO2: 99 %  There were no vitals filed for this visit.  No intake or output data in the 24 hours ending 02/05/21 1226 Net IO Since Admission: No IO data has been entered for this period [02/05/21 1226]  Physical Exam: Constitutional: Patient resting in bed. No acute distress. Cardio: Regular rate and rhythm. No murmurs, rubs, gallops. Pulm: Normal work of breathing. Clear to auscultation bilaterally. Abdomen: Soft, non-tender. MSK: Healing surgical incision over anterior aspect of L shin. Surgical staples present. Wound dehiscence to proximal portion, draining serosanguinous fluid. Surgical incision to L groin that appears mostly healed. Edema and tenderness to palpation present. R AKA.  Neuro: Alert and oriented x 3. No focal deficit. Psych: Normal mood and affect.   Patient Lines/Drains/Airways Status     Active Line/Drains/Airways     Name Placement date Placement time Site Days   Peripheral IV 02/05/21 20 G 1" Left Antecubital 02/05/21  0129  Antecubital  less than 1   Incision (Closed) 01/20/21 Groin Left 01/20/21  1907  -- 16   Incision (Closed) 01/20/21 Leg Left 01/20/21  1907  -- 16              ASSESSMENT/PLAN:  Assessment: Active Problems:   Leg swelling   Plan: #Post-op pain and edema of LLE #L common femoral to posterior tibial artery bypass with prosthetic graft 08/24 #Wound dehiscence Patient with small area of wound dehiscence of proximal aspect of incision to lower leg.  CTA showed patent vasculature and graft, pulses palpated via doppler. There is new fluid collection in L groin and calf concerning for hematoma from graft leak vs. Fluid/infection. No warmth or erythema surrounding incision sites and no systemic signs of infection. Lower leg incision draining serosanguinous fluid.  - Vascular surgery consulted, appreciate their recommendations.  - Does not think there is a need for immediate washout but are considering removing staples and inspect wound further  - Recommending empiric antibiotics, pharmacy consulted, appreciate their recommendations   - Vancomycin 1500 mg IV x 1 dose then 750 mg IV q8h   - Trend renal function   - Trend vancomycin levels - Hold Eliquis - Trend CBC - Dilaudid 1.0 q4h PRN pain, oxycodone 5 mg OP q4h PRN pain - Continue home lyrica 100 mg TID  #PAD, multiple vascular procedures #R AKA #R common femoral DVT Patient has undergone the majority of his vascular interventions in [place]. He reported multiple RLE interventions but ultimately required R AKA. He initiated care with [facility] in June with complaints of claudication and CTA that showed patent left iliac stents, occlusion from the right iliac all the way down  into the right SFA, occlusion of L superficial femoral artery and popliteal artery, and reconstitution of the distal posterior tibial and peroneal artery on the left leg. On 01/20/2021 he underwent operation for femoral bypass with gore propaten vascular graft.  Ultrasound 09/09 showed no obstruction to LLE vasculature, fluid in the groin; no obstruction to RLE vasculature. ABI of LLE 09/09 was WNL without signs of  significant arterial disease.  - Hold Eliquis - Resume home atorvastatin 80 mg   #Normocytic anemia Hgb 11.3 which is comparable to previous baseline of 11-12. - Trend CBC - Transfuse Hgb <7  #GERD - Pepcid 20 mg daily - Maalox/Mylanta PRN  #Chronic depression - Continue home Cymbalta 60 mg daily - Continue home Seroquel 150 mg daily  Best Practice: Diet: Cardiac diet IVF: Fluids: None VTE: None Code: Full AB: Vancomycin 1500 mg x1, then 750 mg q8h DISPO: Anticipated discharge pending Medical stability.  Signature: Farrel Gordon, D.O.  Internal Medicine Resident, PGY-1 [facility]  Pager: [phone] 12:26 PM, 02/05/2021   Please contact the on call pager after 5 pm and on weekends at [phone].

## 2021-02-06 DIAGNOSIS — Z7901 Long term (current) use of anticoagulants: Secondary | ICD-10-CM | POA: Diagnosis not present

## 2021-02-06 DIAGNOSIS — M792 Neuralgia and neuritis, unspecified: Secondary | ICD-10-CM | POA: Diagnosis not present

## 2021-02-06 DIAGNOSIS — T8781 Dehiscence of amputation stump: Secondary | ICD-10-CM | POA: Diagnosis not present

## 2021-02-06 DIAGNOSIS — Z86718 Personal history of other venous thrombosis and embolism: Secondary | ICD-10-CM | POA: Diagnosis not present

## 2021-02-06 DIAGNOSIS — G8918 Other acute postprocedural pain: Secondary | ICD-10-CM | POA: Diagnosis not present

## 2021-02-06 DIAGNOSIS — T8130XA Disruption of wound, unspecified, initial encounter: Secondary | ICD-10-CM | POA: Diagnosis present

## 2021-02-06 DIAGNOSIS — F32A Depression, unspecified: Secondary | ICD-10-CM | POA: Diagnosis present

## 2021-02-06 DIAGNOSIS — K219 Gastro-esophageal reflux disease without esophagitis: Secondary | ICD-10-CM | POA: Diagnosis present

## 2021-02-06 DIAGNOSIS — T827XXA Infection and inflammatory reaction due to other cardiac and vascular devices, implants and grafts, initial encounter: Secondary | ICD-10-CM | POA: Diagnosis not present

## 2021-02-06 DIAGNOSIS — Z91041 Radiographic dye allergy status: Secondary | ICD-10-CM | POA: Diagnosis not present

## 2021-02-06 DIAGNOSIS — Z79899 Other long term (current) drug therapy: Secondary | ICD-10-CM | POA: Diagnosis not present

## 2021-02-06 DIAGNOSIS — D649 Anemia, unspecified: Secondary | ICD-10-CM

## 2021-02-06 DIAGNOSIS — Z89611 Acquired absence of right leg above knee: Secondary | ICD-10-CM | POA: Diagnosis not present

## 2021-02-06 DIAGNOSIS — M7989 Other specified soft tissue disorders: Secondary | ICD-10-CM | POA: Diagnosis present

## 2021-02-06 DIAGNOSIS — D72829 Elevated white blood cell count, unspecified: Secondary | ICD-10-CM | POA: Diagnosis present

## 2021-02-06 DIAGNOSIS — I70202 Unspecified atherosclerosis of native arteries of extremities, left leg: Secondary | ICD-10-CM | POA: Diagnosis present

## 2021-02-06 DIAGNOSIS — G546 Phantom limb syndrome with pain: Secondary | ICD-10-CM | POA: Diagnosis present

## 2021-02-06 DIAGNOSIS — Z20822 Contact with and (suspected) exposure to covid-19: Secondary | ICD-10-CM | POA: Diagnosis present

## 2021-02-06 DIAGNOSIS — Y838 Other surgical procedures as the cause of abnormal reaction of the patient, or of later complication, without mention of misadventure at the time of the procedure: Secondary | ICD-10-CM | POA: Diagnosis present

## 2021-02-06 DIAGNOSIS — Z8249 Family history of ischemic heart disease and other diseases of the circulatory system: Secondary | ICD-10-CM | POA: Diagnosis not present

## 2021-02-06 DIAGNOSIS — Z7982 Long term (current) use of aspirin: Secondary | ICD-10-CM | POA: Diagnosis not present

## 2021-02-06 LAB — CBC
HCT: 30.9 % — ABNORMAL LOW (ref 39.0–52.0)
Hemoglobin: 9.7 g/dL — ABNORMAL LOW (ref 13.0–17.0)
MCH: 26.5 pg (ref 26.0–34.0)
MCHC: 31.4 g/dL (ref 30.0–36.0)
MCV: 84.4 fL (ref 80.0–100.0)
Platelets: 249 10*3/uL (ref 150–400)
RBC: 3.66 MIL/uL — ABNORMAL LOW (ref 4.22–5.81)
RDW: 13.2 % (ref 11.5–15.5)
WBC: 8.8 10*3/uL (ref 4.0–10.5)
nRBC: 0 % (ref 0.0–0.2)

## 2021-02-06 LAB — BASIC METABOLIC PANEL
Anion gap: 8 (ref 5–15)
BUN: 12 mg/dL (ref 6–20)
CO2: 24 mmol/L (ref 22–32)
Calcium: 8.5 mg/dL — ABNORMAL LOW (ref 8.9–10.3)
Chloride: 105 mmol/L (ref 98–111)
Creatinine, Ser: 1.12 mg/dL (ref 0.61–1.24)
GFR, Estimated: >60 mL/min (ref >60)
Glucose, Bld: 132 mg/dL — ABNORMAL HIGH (ref 70–99)
Potassium: 3.5 mmol/L (ref 3.5–5.1)
Sodium: 137 mmol/L (ref 135–145)

## 2021-02-06 LAB — FERRITIN: Ferritin: 41 ng/mL (ref 24–336)

## 2021-02-06 LAB — IRON AND TIBC
Iron: 48 ug/dL (ref 45–182)
Saturation Ratios: 16 % — ABNORMAL LOW (ref 17.9–39.5)
TIBC: 297 ug/dL (ref 250–450)
UIBC: 249 ug/dL

## 2021-02-06 NOTE — Progress Notes (Signed)
[facility] OF [place] PROGRESS NOTE  ASSESSMENT / PLAN: Jose Hansen is a 50 y.o. male status post left common femoral to posterior tibial artery bypass greater than 2 weeks ago.  He is a superficial dehiscence at the proximal aspect of the calf incision with some seropurulent drainage.  We will continue conservative therapy with local wound care and antibiotics.  3 staples removed from the proximal aspect of the left leg incision.  SUBJECTIVE: No interval complaints.  No fevers.  No chills.  OBJECTIVE: BP (!) 97/52 (BP Location: Right Arm)   Pulse 67   Temp 97.9 F (36.6 C) (Oral)   Resp 18   SpO2 97%   Intake/Output Summary (Last 24 hours) at 02/06/2021 1009 Last data filed at 02/06/2021 0909 Gross per 24 hour  Intake 240 ml  Output --  Net 240 ml    No acute distress Regular rate and rhythm 1+ posterior tibial artery pulse on the left Right above-knee amputation Appearance of left calf incision.  Mild superficial dehiscence of the proximal incision.  3 staples removed.  CBC Latest Ref Rng & Units 02/06/2021 02/05/2021 02/04/2021  WBC 4.0 - 10.5 K/uL 8.8 8.4 10.3  Hemoglobin 13.0 - 17.0 g/dL 9.7(L) 11.0(L) 11.3(L)  Hematocrit 39.0 - 52.0 % 30.9(L) 35.6(L) 36.9(L)  Platelets 150 - 400 K/uL 249 295 281     CMP Latest Ref Rng & Units 02/06/2021 02/05/2021 02/04/2021  Glucose 70 - 99 mg/dL 132(H) 137(H) 96  BUN 6 - 20 mg/dL '12 11 11  '$ Creatinine 0.61 - 1.24 mg/dL 1.12 1.24 1.23  Sodium 135 - 145 mmol/L 137 136 140  Potassium 3.5 - 5.1 mmol/L 3.5 4.4 3.8  Chloride 98 - 111 mmol/L 105 100 107  CO2 22 - 32 mmol/L '24 24 26  '$ Calcium 8.9 - 10.3 mg/dL 8.5(L) 9.4 9.0  Total Protein 6.5 - 8.1 g/dL - - -  Total Bilirubin 0.3 - 1.2 mg/dL - - -  Alkaline Phos 38 - 126 U/L - - -  AST 15 - 41 U/L - - -  ALT 0 - 44 U/L - - -   Shaylene Paganelli N. Stanford Breed, MD [facility] Phone Number: [phone] 02/06/2021 10:09 AM

## 2021-02-06 NOTE — Progress Notes (Signed)
HD#0 SUBJECTIVE:  Patient Summary: Jose Hansen is a 50 y.o. with a pertinent PMH of PVD s/p multiple vascular interventions, AK, GERD, depression, insomnia with auditory hallucinations, and recent surgical bypass for critical limb ischemia 2/2 PAD 01/20/2021, who presented with LLE pain and swelling and admitted for post-op complications s/p LLE surgical bypass for critical limb ischemia 2/2 PAD.   Overnight Events: None  Interim History: Mr. Jose Hansen continue to have some pain in his LLE but otherwise has no complaints.  OBJECTIVE:  Vital Signs: Vitals:   02/05/21 2357 02/06/21 0449 02/06/21 0848 02/06/21 1230  BP: 117/72 105/74 (!) 97/52 113/68  Pulse: 82 70 67 68  Resp: '18 18 18 18  '$ Temp: 98.3 F (36.8 C)  97.9 F (36.6 C) (!) 97.4 F (36.3 C)  TempSrc:   Oral Oral  SpO2: 100% 100% 97% 99%   Supplemental O2: Room Air SpO2: 99 %  There were no vitals filed for this visit.   Intake/Output Summary (Last 24 hours) at 02/06/2021 1435 Last data filed at 02/06/2021 1425 Gross per 24 hour  Intake 360 ml  Output --  Net 360 ml   Net IO Since Admission: 360 mL [02/06/21 1435]  Physical Exam: Constitutional: Patient resting in bed. No acute distress. Cardio: Regular rate and rhythm. No murmurs, rubs, gallops. Pulm: Normal work of breathing. Clear to auscultation bilaterally. Abdomen: Soft, non-tender. MSK: Healing surgical incision over anteromedial  aspect of LLE Surgical staples present. Wound dehiscence to proximal portion, draining serosanguinous fluid scant purulent appearing fluid. Surgical incision to L groin that appears mostly healed. Firm palpation. S/p R AKA.  Neuro: Alert and oriented x 3. No focal deficit. Psych: Normal mood and affect.   Patient Lines/Drains/Airways Status     Active Line/Drains/Airways     Name Placement date Placement time Site Days   Peripheral IV 02/05/21 20 G 1" Left Antecubital 02/05/21  0129  Antecubital  less than 1   Incision  (Closed) 01/20/21 Groin Left 01/20/21  1907  -- 16   Incision (Closed) 01/20/21 Leg Left 01/20/21  1907  -- 16             ASSESSMENT/PLAN:  Assessment: Active Problems:   Leg swelling   Wound dehiscence   Plan:  #s/p L common femoral to posterior tibial artery bypass with prosthetic graft 08/24 #Wound dehiscence Patient with small area of wound dehiscence on proximal aspect LLE incision.  CTA showed patent vasculature and graft, pulses dopplerable There is new fluid collection in L groin and calf initlaly concerning for hematoma more likely related to infection. Though exact cause remains unclear. No warmth or erythema surrounding incision sites and no systemic signs of infection. Lower leg incision draining serosanguinous fluid w/ scant purulent drainage.  - Vascular surgery consulted, appreciate their recommendations.  - No need for washout currently. Contin IV antibiotics w/ vancomycin + zosyn  - Continue Eliquis, low suspcion fluid collections due to bleeding.  - Trend CBC - HM for pain  - Continue home lyrica 100 mg TID  #PAD, multiple vascular procedures #R AKA Patient has undergone the majority of his vascular interventions in [place]. He reported multiple RLE interventions but ultimately required R AKA. He initiated care with [facility] in June with complaints of claudication and CTA that showed patent left iliac stents, occlusion from the right iliac all the way down into the right SFA, occlusion of L superficial femoral artery and popliteal artery, and reconstitution of the distal posterior tibial and  peroneal artery on the left leg. On 01/20/2021 he underwent operation for femoral bypass with gore propaten vascular graft.  Ultrasound 09/09 showed no obstruction to LLE vasculature, fluid in the groin; no obstruction to RLE vasculature. ABI of LLE 09/09 was WNL without signs of significant arterial disease.  - Continue home atorvastatin 80 mg   #H/o R common femoral DVT Home  eliquis resumed 9/9   #Normocytic anemia Hgb 11.3 which is comparable to previous baseline of 11-12. - Trend CBC - Transfuse Hgb <7  #GERD - Pepcid 20 mg daily - Maalox/Mylanta PRN  #Chronic depression - Continue home Cymbalta 60 mg daily - Continue home Seroquel 150 mg daily  Best Practice: Diet: Cardiac diet IVF: Fluids: None VTE: None Code: Full AB: Vancomycin 1500 mg x1, then 750 mg q8h DISPO: Anticipated discharge pending Medical stability.  Signature: Rick Duff MD Internal Medicine Resident, PGY-2 [facility]  Pager: [phone] 2:35 PM, 02/06/2021   Please contact the on call pager after 5 pm and on weekends at [phone].

## 2021-02-06 NOTE — Plan of Care (Signed)
  Problem: Education: Goal: Knowledge of General Education information will improve Description: Including pain rating scale, medication(s)/side effects and non-pharmacologic comfort measures Outcome: Progressing   Problem: Clinical Measurements: Goal: Ability to maintain clinical measurements within normal limits will improve Outcome: Progressing   Problem: Activity: Goal: Risk for activity intolerance will decrease Outcome: Progressing   Problem: Elimination: Goal: Will not experience complications related to bowel motility Outcome: Progressing   Problem: Pain Managment: Goal: General experience of comfort will improve Outcome: Progressing   Problem: Safety: Goal: Ability to remain free from injury will improve Outcome: Progressing   Problem: Skin Integrity: Goal: Risk for impaired skin integrity will decrease Outcome: Progressing   

## 2021-02-07 LAB — BASIC METABOLIC PANEL
Anion gap: 7 (ref 5–15)
BUN: 11 mg/dL (ref 6–20)
CO2: 23 mmol/L (ref 22–32)
Calcium: 8.8 mg/dL — ABNORMAL LOW (ref 8.9–10.3)
Chloride: 110 mmol/L (ref 98–111)
Creatinine, Ser: 1.22 mg/dL (ref 0.61–1.24)
GFR, Estimated: >60 mL/min (ref >60)
Glucose, Bld: 118 mg/dL — ABNORMAL HIGH (ref 70–99)
Potassium: 4.1 mmol/L (ref 3.5–5.1)
Sodium: 140 mmol/L (ref 135–145)

## 2021-02-07 LAB — CBC WITH DIFFERENTIAL/PLATELET
Abs Immature Granulocytes: 0.11 10*3/uL — ABNORMAL HIGH (ref 0.00–0.07)
Basophils Absolute: 0.1 10*3/uL (ref 0.0–0.1)
Basophils Relative: 1 %
Eosinophils Absolute: 0.4 10*3/uL (ref 0.0–0.5)
Eosinophils Relative: 5 %
HCT: 35 % — ABNORMAL LOW (ref 39.0–52.0)
Hemoglobin: 11.2 g/dL — ABNORMAL LOW (ref 13.0–17.0)
Immature Granulocytes: 1 %
Lymphocytes Relative: 26 %
Lymphs Abs: 2.3 10*3/uL (ref 0.7–4.0)
MCH: 27 pg (ref 26.0–34.0)
MCHC: 32 g/dL (ref 30.0–36.0)
MCV: 84.3 fL (ref 80.0–100.0)
Monocytes Absolute: 1 10*3/uL (ref 0.1–1.0)
Monocytes Relative: 11 %
Neutro Abs: 4.9 10*3/uL (ref 1.7–7.7)
Neutrophils Relative %: 56 %
Platelets: 259 10*3/uL (ref 150–400)
RBC: 4.15 MIL/uL — ABNORMAL LOW (ref 4.22–5.81)
RDW: 13.2 % (ref 11.5–15.5)
WBC: 8.8 10*3/uL (ref 4.0–10.5)
nRBC: 0 % (ref 0.0–0.2)

## 2021-02-07 MED ORDER — OXYCODONE HCL 5 MG PO TABS
10.0000 mg | ORAL_TABLET | ORAL | Status: DC | PRN
Start: 2021-02-07 — End: 2021-02-09
  Administered 2021-02-07 – 2021-02-08 (×5): 10 mg via ORAL
  Filled 2021-02-07 (×6): qty 2

## 2021-02-07 MED ORDER — HYDROMORPHONE HCL 2 MG PO TABS
2.0000 mg | ORAL_TABLET | ORAL | Status: DC | PRN
Start: 2021-02-07 — End: 2021-02-07
  Filled 2021-02-07: qty 1

## 2021-02-07 MED ORDER — HYDROMORPHONE HCL 1 MG/ML IJ SOLN
1.0000 mg | INTRAMUSCULAR | Status: DC | PRN
  Administered 2021-02-07 – 2021-02-12 (×28): 1 mg via INTRAVENOUS
  Filled 2021-02-07 (×28): qty 1

## 2021-02-07 NOTE — Progress Notes (Signed)
[facility]IN SPECIAL[facility] OF [place] PROGRESS NOTE  ASSESSMENT / PLAN: Jose Hansen is a 50 y.o. male status post left common femoral to posterior tibial artery bypass greater than 2 weeks ago.  He is a superficial dehiscence at the proximal aspect of the calf incision with seropurulent drainage.  We will continue conservative therapy with local wound care and antibiotics.    SUBJECTIVE: Patient reports worsening drainage from the leg last night requiring dressing changes. No fevers.  No chills.  OBJECTIVE: BP 105/62 (BP Location: Right Arm)   Pulse 72   Temp 98.1 F (36.7 C) (Oral)   Resp 17   SpO2 99%   Intake/Output Summary (Last 24 hours) at 02/07/2021 0937 Last data filed at 02/07/2021 [id number] Gross per 24 hour  Intake 1360 ml  Output 350 ml  Net 1010 ml     No acute distress Regular rate and rhythm 1+ posterior tibial artery pulse on the left Right above-knee amputation Mild superficial dehiscence of the proximal incision. Clean bandage.  CBC Latest Ref Rng & Units 02/07/2021 02/06/2021 02/05/2021  WBC 4.0 - 10.5 K/uL 8.8 8.8 8.4  Hemoglobin 13.0 - 17.0 g/dL 11.2(L) 9.7(L) 11.0(L)  Hematocrit 39.0 - 52.0 % 35.0(L) 30.9(L) 35.6(L)  Platelets 150 - 400 K/uL 259 249 295     CMP Latest Ref Rng & Units 02/07/2021 02/06/2021 02/05/2021  Glucose 70 - 99 mg/dL 118(H) 132(H) 137(H)  BUN 6 - 20 mg/dL '11 12 11  '$ Creatinine 0.61 - 1.24 mg/dL 1.22 1.12 1.24  Sodium 135 - 145 mmol/L 140 137 136  Potassium 3.5 - 5.1 mmol/L 4.1 3.5 4.4  Chloride 98 - 111 mmol/L 110 105 100  CO2 22 - 32 mmol/L '23 24 24  '$ Calcium 8.9 - 10.3 mg/dL 8.8(L) 8.5(L) 9.4  Total Protein 6.5 - 8.1 g/dL - - -  Total Bilirubin 0.3 - 1.2 mg/dL - - -  Alkaline Phos 38 - 126 U/L - - -  AST 15 - 41 U/L - - -  ALT 0 - 44 U/L - - -   Haily Caley N. Stanford Breed, MD [facility] of [facility] Phone Number: [phone] 02/07/2021 9:37 AM

## 2021-02-07 NOTE — Hospital Course (Addendum)
#  Post-op pain and edema of LLE #L common femoral to posterior tibial artery bypass with prosthetic graft 08/24 #Wound dehiscence Patient presented with pain, swelling to LLE and a small area of wound dehiscence of proximal aspect of incision to lower leg with serosanguinous drainage.  CTA showed patent vasculature and graft, pulses palpated via doppler. He was initiated on vancomycin 1500 mg IV one dose and ceftriaxone 1g IV for one dose. This was transitioned to vancomycin 750 mg IV and Zosyn 3.375 g IV on 09/09.  Superficial cultures from the wound on 09/11 did note MRSA.  On 09/13 vascular surgery did take the patient for a wound washout.  During this procedure there was no evidence of active infection noted.  Wound cultures from this procedure did not have growth.  The incision was closed with deep mattress sutures as well as staples and covered with wound VAC.  He was initiated on doxycycline 100 mg twice daily and will complete this course after 2 weeks of treatment.  Home health PT order placed on discharge.   #PAD, multiple vascular procedures #R AKA #H/o R common femoral DVT Patient has undergone the majority of his vascular interventions in [place]. He reported multiple RLE interventions but ultimately required R AKA. He initiated care with [facility] in June with complaints of claudication and CTA that showed patent left iliac stents, occlusion from the right iliac all the way down into the right SFA, occlusion of L superficial femoral artery and popliteal artery, and reconstitution of the distal posterior tibial and peroneal artery on the left leg. On 01/20/2021 he underwent operation for femoral bypass with gore propaten vascular graft.  Ultrasound 09/09 showed no obstruction to LLE vasculature, fluid in the groin; no obstruction to RLE vasculature. ABI of LLE 09/09 was WNL without signs of significant arterial disease. He was continued on home Eliquis throughout his stay.   #Normocytic  anemia #GERD #Chronic depression Conditions that are managed chronically. Supportive care provided throughout stay.

## 2021-02-07 NOTE — Progress Notes (Signed)
HD#1 SUBJECTIVE:  Patient Summary: Jose Hansen is a 50 y.o. with a pertinent PMH of PVD s/p multiple vascular interventions, AK, GERD, depression, insomnia with auditory hallucinations, and recent surgical bypass for critical limb ischemia 2/2 PAD 01/20/2021, who presented with LLE pain and swelling and admitted for post-op complications s/p LLE surgical bypass for critical limb ischemia 2/2 PAD.   Overnight Events: None  Interim History: [name] reports feeling well today with the exception of continued pain. He states that overnight the incision to LLE was draining green purulent fluid requiring dressing changes.  OBJECTIVE:  Vital Signs: Vitals:   02/06/21 1628 02/06/21 2015 02/07/21 0507 02/07/21 0758  BP: 115/72 (!) 108/59 (!) 102/50 105/62  Pulse: 98 74 64 72  Resp: '18  18 17  '$ Temp: 98.1 F (36.7 C) 98 F (36.7 C) 97.8 F (36.6 C) 98.1 F (36.7 C)  TempSrc: Oral Oral Oral Oral  SpO2: 99% 98% 97% 99%   Supplemental O2: Room Air SpO2: 99 %  There were no vitals filed for this visit.   Intake/Output Summary (Last 24 hours) at 02/07/2021 0948 Last data filed at 02/07/2021 [id number] Gross per 24 hour  Intake 1360 ml  Output 350 ml  Net 1010 ml   Net IO Since Admission: 1,250 mL [02/07/21 0948]  Physical Exam: Constitutional: Patient resting comfortably in bed. In no acute distress. Cardio: Regular rate and rhythm. No murmurs/rubs/gallops. Pulm: Clear to auscultation bilaterally. Normal effort of breathing. Abdomen: Soft, non-distended. MSK: S/p R AKA. LLE extremely tender to palpation, particularly around incision site to lower aspect of LLE. Superficial wound dehiscence to proximal aspect of surgical incision site. Site not actively draining on exam but had wound dressing over it that had drainage on it. Site of superficial dehiscence with beefy red tissue with normal skin surrounding. The LLE is diffusely warm compared to RLE. Surgical staples over incision.   Skin: Increased warmth of LLE>RLE Neuro: Alert and oriented x 3. No focal deficit.  Psych: Normal mood and affect.   Patient Lines/Drains/Airways Status     Active Line/Drains/Airways     Name Placement date Placement time Site Days   Peripheral IV 02/06/21 20 G 1.88" Anterior;Left;Proximal Forearm 02/06/21  0350  Forearm  1   Incision (Closed) 01/20/21 Groin Left 01/20/21  1907  -- 18   Incision (Closed) 01/20/21 Leg Left 01/20/21  1907  -- 18             ASSESSMENT/PLAN:  Assessment: Active Problems:   Leg swelling   Wound dehiscence   Plan: #Post-op pain and edema of LLE #L common femoral to posterior tibial artery bypass with prosthetic graft 08/24 #Wound dehiscence Patient with small area of wound dehiscence of proximal aspect of incision to lower leg.  CTA showed patent vasculature and graft, pulses palpated via doppler. No fever, chills, or malaise. LLE is warm compared to RLE. Lower leg incision draining serosanguinous fluid with patient reporting increased drainage overnight of green colored liquid.  - Vascular surgery consulted, appreciate their recommendations.             - Does not think there is a need for immediate washout. Concern that infection would require graft excision.  - Continue wound care              - Vancomycin 750 mg IV, Zosyn 3.375 g                         -  Trend renal function                         - Trend vancomycin levels - Dilaudid 1.0 q4h PRN pain, oxycodone 5 mg OP q4h PRN pain - Continue home lyrica 100 mg TID   #PAD, multiple vascular procedures #R AKA #H/o R common femoral DVT Patient has undergone the majority of his vascular interventions in [place]. He reported multiple RLE interventions but ultimately required R AKA. He initiated care with [facility] in June with complaints of claudication and CTA that showed patent left iliac stents, occlusion from the right iliac all the way down into the right SFA, occlusion of L superficial  femoral artery and popliteal artery, and reconstitution of the distal posterior tibial and peroneal artery on the left leg. On 01/20/2021 he underwent operation for femoral bypass with gore propaten vascular graft.  Ultrasound 09/09 showed no obstruction to LLE vasculature, fluid in the groin; no obstruction to RLE vasculature. ABI of LLE 09/09 was WNL without signs of significant arterial disease.  - Eliquis 5 mg BID - Atorvastatin 80 mg    #Normocytic anemia Hgb 11.2 which is comparable to previous baseline of 11-12. - Trend CBC - Transfuse Hgb <7   #GERD - Pepcid 20 mg daily   #Chronic depression - Continue home Cymbalta 60 mg daily - Continue home Seroquel 150 mg daily  Best Practice: Diet: Cardiac diet IVF: Fluids: None VTE: Eliquis 5 mg BID Code: Full AB: Zosyn 3.375 g IV, vancomycin 750 mg IV DISPO: Anticipated discharge pending Medical stability.  Signature: Farrel Gordon, D.O.  Internal Medicine Resident, PGY-1 [facility]  Pager: [phone] 9:48 AM, 02/07/2021   Please contact the on call pager after 5 pm and on weekends at [phone].

## 2021-02-08 DIAGNOSIS — T827XXA Infection and inflammatory reaction due to other cardiac and vascular devices, implants and grafts, initial encounter: Secondary | ICD-10-CM

## 2021-02-08 DIAGNOSIS — T8130XA Disruption of wound, unspecified, initial encounter: Secondary | ICD-10-CM | POA: Diagnosis not present

## 2021-02-08 DIAGNOSIS — M7989 Other specified soft tissue disorders: Secondary | ICD-10-CM | POA: Diagnosis not present

## 2021-02-08 LAB — CBC
HCT: 34.9 % — ABNORMAL LOW (ref 39.0–52.0)
Hemoglobin: 11 g/dL — ABNORMAL LOW (ref 13.0–17.0)
MCH: 26.8 pg (ref 26.0–34.0)
MCHC: 31.5 g/dL (ref 30.0–36.0)
MCV: 85.1 fL (ref 80.0–100.0)
Platelets: 280 10*3/uL (ref 150–400)
RBC: 4.1 MIL/uL — ABNORMAL LOW (ref 4.22–5.81)
RDW: 13.4 % (ref 11.5–15.5)
WBC: 7.2 10*3/uL (ref 4.0–10.5)
nRBC: 0 % (ref 0.0–0.2)

## 2021-02-08 LAB — BASIC METABOLIC PANEL
Anion gap: 9 (ref 5–15)
BUN: 10 mg/dL (ref 6–20)
CO2: 23 mmol/L (ref 22–32)
Calcium: 8.7 mg/dL — ABNORMAL LOW (ref 8.9–10.3)
Chloride: 106 mmol/L (ref 98–111)
Creatinine, Ser: 1.14 mg/dL (ref 0.61–1.24)
GFR, Estimated: >60 mL/min (ref >60)
Glucose, Bld: 168 mg/dL — ABNORMAL HIGH (ref 70–99)
Potassium: 3.9 mmol/L (ref 3.5–5.1)
Sodium: 138 mmol/L (ref 135–145)

## 2021-02-08 MED ORDER — SENNA 8.6 MG PO TABS
1.0000 | ORAL_TABLET | Freq: Every day | ORAL | Status: DC
  Filled 2021-02-08 (×3): qty 1

## 2021-02-08 MED ORDER — POLYETHYLENE GLYCOL 3350 17 G PO PACK
17.0000 g | PACK | Freq: Every day | ORAL | Status: DC
Start: 2021-02-08 — End: 2021-02-12
  Filled 2021-02-08 (×3): qty 1

## 2021-02-08 NOTE — Progress Notes (Signed)
Pharmacy Antibiotic Note  Jose Hansen is a 50 y.o. male admitted on 02/04/2021 with  swelling and drainage from lower extremity bypass incision .  Pharmacy has been consulted for Vancomycin and Zosyn dosing - day #4. Vascular planning procedure 9/13 for bypass graft wound washout and possible resection.  WBC wnl, afebrile SCr down to 1.14 - baseline ~1-1.2  Plan: Zosyn 3.375 gm every 8 hours Continue vancomycin 750 mg IV every 8 hours (eAUC 446, Goal AUC 400-550, SCr 1.14) Monitor renal function, CBC, Cx and [facility] recs De-escalate as able - f/u [facility] plans post-op 9/13 and obtain levels if vancomycin to continue  Temp (24hrs), Avg:97.7 F (36.5 C), Min:97.5 F (36.4 C), Max:97.9 F (36.6 C)  Recent Labs  Lab 02/04/21 1814 02/05/21 0120 02/05/21 0512 02/06/21 0028 02/06/21 0733 02/07/21 0237 02/08/21 0923  WBC 10.3  --  8.4  --  8.8 8.8 7.2  CREATININE 1.23  --  1.24 1.12  --  1.22 1.14  LATICACIDVEN 2.0* 1.5  --   --   --   --   --      CrCl cannot be calculated (Unknown ideal weight.).    Allergies  Allergen Reactions   Contrast Media [Iodinated Diagnostic Agents] Nausea Only    NOT AN ALLERGY. ONLY NAUSEA. 12/11/20  Newly reported by [facility] on 12/04/20.    Arturo Morton, PharmD, BCPS Please check AMION for all [facility] contact numbers Clinical Pharmacist 02/08/2021 2:02 PM

## 2021-02-08 NOTE — Progress Notes (Addendum)
[facility]  Subjective  - Left LE incision with continued drainage superior incision.   Objective 124/71 70 97.6 F (36.4 C) (Oral) 17 100%  Intake/Output Summary (Last 24 hours) at 02/08/2021 0736 Last data filed at 02/08/2021 0335 Gross per 24 hour  Intake 1075 ml  Output 300 ml  Net 775 ml    Left foot is warm to touch, motor intact.  No available doppler Purulent drainage proximal LE incision.  Dry guaze changed this am Lungs non labored breathing  Assessment/Planning: status post left common femoral to posterior tibial artery bypass with PTFT 01/20/21 Continued purulent drainage from incision.  Patient has pain in the area of the incision.  We may want to remove every other staple for drainage.  He may need  an I & D of the incision since it is PTFT.  Continued IV antibiotics for now.   Pending exam and recommendations from Dr. Donzetta Matters today.  He is on Eliquis 5 mg BID last dose 02/07/21.   Roxy Horseman 02/08/2021 7:36 AM --  Laboratory Lab Results: Recent Labs    02/06/21 0733 02/07/21 0237  WBC 8.8 8.8  HGB 9.7* 11.2*  HCT 30.9* 35.0*  PLT 249 259   BMET Recent Labs    02/06/21 0028 02/07/21 0237  NA 137 140  K 3.5 4.1  CL 105 110  CO2 24 23  GLUCOSE 132* 118*  BUN 12 11  CREATININE 1.12 1.22  CALCIUM 8.5* 8.8*    COAG Lab Results  Component Value Date   INR 1.0 02/05/2021   INR 1.0 01/12/2021   INR 1.0 11/24/2020   No results found for: PTT  I have independently interviewed and examined patient and agree with PA assessment and plan above.  Given ongoing drainage we will plan to washout the wound with concern for PTFE infection.  I discussed with the patient that the bypass graft is infected this may ultimately need to be resected and would be a limb threatening situation.  Hopefully given the fact that he has no fevers and no leukocytosis we can just wash it out possibly get the wound closed over top of  the bypass graft as the CT scan did not demonstrate any communication to the level of the graft.  Patient demonstrates excellent understanding.  I have held his Eliquis and he will be n.p.o. past midnight.   Adabella Stanis C. Donzetta Matters, MD [facility] Office: [phone] Pager: [phone]

## 2021-02-08 NOTE — Progress Notes (Signed)
HD#2 SUBJECTIVE:  Patient Summary: Jose Hansen is a 50 y.o. with a pertinent PMH of PVD s/p multiple vascular interventions, AK, GERD, depression, insomnia with auditory hallucinations, and recent surgical bypass for critical limb ischemia 2/2 PAD 01/20/2021, who presented with LLE pain and swelling and admitted for post-op complications s/p LLE surgical bypass for critical limb ischemia 2/2 PAD.   Overnight Events: None  Interim History: [name] reports feeling well today and continues to have pain though he doesn't clarify if changing his pain medication regimen yesterday has helped. He has no further complaints.  OBJECTIVE:  Vital Signs: Vitals:   02/07/21 0758 02/07/21 1926 02/08/21 0331 02/08/21 0805  BP: 105/62 121/73 124/71 122/84  Pulse: 72 81 70 73  Resp: '17 17 17 18  '$ Temp: 98.1 F (36.7 C) 97.9 F (36.6 C) 97.6 F (36.4 C) (!) 97.5 F (36.4 C)  TempSrc: Oral Oral Oral Oral  SpO2: 99% 99% 100% 100%   Supplemental O2: Room Air SpO2: 100 %  There were no vitals filed for this visit.   Intake/Output Summary (Last 24 hours) at 02/08/2021 1240 Last data filed at 02/08/2021 1111 Gross per 24 hour  Intake 1075 ml  Output --  Net 1075 ml   Net IO Since Admission: 2,265 mL [02/08/21 1240]  Physical Exam: Constitutional: Patient sleeping comfortably in bed, awakes to voice. No acute distress. HENT: Eyes: Neck: Cardio: Regular rate and rhythm. No murmurs/rubs/gallops. Pulm: Clear to auscultation bilaterally.  Normal work of breathing. Chest: Abdomen: Soft, nondistended, nontender. GU: MSK: S/p R AKA.  LLE tender to palpation particular around the incision site through the lower aspect of LLE.  Superficial wound dehiscence to proximal aspect of surgical incision site with wound dressing that is dry but evidence of serosanguineous drainage.  LLE is warm.  Appears to be less tender on exam than previous days. Skin: Skin is warm and dry. Neuro: Alert and  oriented x 3. No focal deficit. Psych: Normal mood and affect.   Patient Lines/Drains/Airways Status     Active Line/Drains/Airways     Name Placement date Placement time Site Days   Peripheral IV 02/06/21 20 G 1.88" Anterior;Left;Proximal Forearm 02/06/21  0350  Forearm  2   Incision (Closed) 01/20/21 Groin Left 01/20/21  1907  -- 19   Incision (Closed) 01/20/21 Leg Left 01/20/21  1907  -- 19             ASSESSMENT/PLAN:  Assessment: Active Problems:   Leg swelling   Wound dehiscence   Plan: #Post-op pain and edema of LLE #L common femoral to posterior tibial artery bypass with prosthetic graft 08/24 #Superficial wound dehiscence Patient has small area of superficial wound dehiscence of proximal aspect of incision to lower leg.  CTA performed on admission showed patent vasculature and graft, pulses palpated via Doppler at that time.  He continues to be afebrile without leukocytosis and denies chills or malaise.  LLE is warm compared to right lower extremity.  The incision is draining serosanguineous fluid. - Vascular surgery consulted, appreciate their recommendations.  - Plan for wound washout tomorrow.  There is concerned that if bypass graft is infected then it will need to be resected at some point and this would be limb threatening.  NPO at midnight.              - Vancomycin 750 mg IV, Zosyn 3.375 g                         -  Trend renal function                         - Trend vancomycin levels - Dilaudid 1.0 q4h PRN pain, oxycodone 10 mg PO q4h PRN pain - Continue home lyrica 100 mg TID  #PAD, multiple vascular procedures #R AKA #H/o R common femoral DVT Have initiated the process of obtaining medical records from [place]. - Eliquis 5 mg BID - Atorvastatin 80 mg   #Normocytic anemia Hgb 11.0 which is comparable to previous baseline of 11-12. - Trend CBC - Transfuse Hgb <7   #GERD - Pepcid 20 mg daily   #Chronic depression - Continue home Cymbalta  60 mg daily - Continue home Seroquel 150 mg daily  Best Practice: Diet: Cardiac diet, NPO at midnight IVF: Fluids: None VTE: Eliquis 5 mg BID, hold at midnight Code: Full AB: Zosyn 3.375 g IV, vancomycin 750 mg IV DISPO: Anticipated discharge pending Medical stability, Wound care, and Pending surgery.  Signature: Farrel Gordon, D.O.  Internal Medicine Resident, PGY-1 [facility]  Pager: [phone] 12:40 PM, 02/08/2021   Please contact the on call pager after 5 pm and on weekends at [phone].

## 2021-02-09 ENCOUNTER — Inpatient Hospital Stay (HOSPITAL_COMMUNITY): Payer: Medicare Other | Admitting: Certified Registered Nurse Anesthetist

## 2021-02-09 ENCOUNTER — Encounter (HOSPITAL_COMMUNITY)
Admission: EM | Disposition: A | Discharge status: Home Health | Payer: Self-pay | Source: Home / Self Care | Attending: Internal Medicine

## 2021-02-09 ENCOUNTER — Encounter (HOSPITAL_COMMUNITY): Payer: Self-pay | Admitting: Internal Medicine

## 2021-02-09 DIAGNOSIS — T8781 Dehiscence of amputation stump: Secondary | ICD-10-CM

## 2021-02-09 DIAGNOSIS — T827XXA Infection and inflammatory reaction due to other cardiac and vascular devices, implants and grafts, initial encounter: Secondary | ICD-10-CM

## 2021-02-09 DIAGNOSIS — T8130XA Disruption of wound, unspecified, initial encounter: Secondary | ICD-10-CM | POA: Diagnosis not present

## 2021-02-09 HISTORY — PX: WOUND DEBRIDEMENT: SHX247

## 2021-02-09 LAB — CBC
HCT: 33.6 % — ABNORMAL LOW (ref 39.0–52.0)
Hemoglobin: 10.6 g/dL — ABNORMAL LOW (ref 13.0–17.0)
MCH: 26.7 pg (ref 26.0–34.0)
MCHC: 31.5 g/dL (ref 30.0–36.0)
MCV: 84.6 fL (ref 80.0–100.0)
Platelets: 279 10*3/uL (ref 150–400)
RBC: 3.97 MIL/uL — ABNORMAL LOW (ref 4.22–5.81)
RDW: 13.3 % (ref 11.5–15.5)
WBC: 7.7 10*3/uL (ref 4.0–10.5)
nRBC: 0 % (ref 0.0–0.2)

## 2021-02-09 SURGERY — DEBRIDEMENT, WOUND
Anesthesia: General | Laterality: Left

## 2021-02-09 MED ORDER — DEXAMETHASONE SODIUM PHOSPHATE 4 MG/ML IJ SOLN
INTRAMUSCULAR | Status: DC | PRN
  Administered 2021-02-09: 10 mg via INTRAVENOUS

## 2021-02-09 MED ORDER — MEPERIDINE HCL 25 MG/ML IJ SOLN: 6.2500 mg | INTRAMUSCULAR | Status: DC | PRN

## 2021-02-09 MED ORDER — LACTATED RINGERS IV SOLN: INTRAVENOUS | Status: DC

## 2021-02-09 MED ORDER — CHLORHEXIDINE GLUCONATE 0.12 % MT SOLN: 15.0000 mL | Freq: Once | OROMUCOSAL | Status: DC

## 2021-02-09 MED ORDER — ACETAMINOPHEN 325 MG PO TABS: 325.0000 mg | ORAL_TABLET | Freq: Once | ORAL | Status: DC | PRN

## 2021-02-09 MED ORDER — HYDROMORPHONE HCL 1 MG/ML IJ SOLN
INTRAMUSCULAR | Status: AC
  Administered 2021-02-09: 0.5 mg via INTRAVENOUS
  Filled 2021-02-09: qty 1

## 2021-02-09 MED ORDER — ACETAMINOPHEN 10 MG/ML IV SOLN
INTRAVENOUS | Status: AC
  Administered 2021-02-09: 1000 mg via INTRAVENOUS
  Filled 2021-02-09: qty 100

## 2021-02-09 MED ORDER — MIDAZOLAM HCL 2 MG/2ML IJ SOLN
INTRAMUSCULAR | Status: AC
  Filled 2021-02-09: qty 2

## 2021-02-09 MED ORDER — ORAL CARE MOUTH RINSE: 15.0000 mL | Freq: Once | OROMUCOSAL | Status: AC

## 2021-02-09 MED ORDER — ACETAMINOPHEN 160 MG/5ML PO SOLN: 325.0000 mg | Freq: Once | ORAL | Status: DC | PRN

## 2021-02-09 MED ORDER — LIDOCAINE HCL (CARDIAC) PF 100 MG/5ML IV SOSY
PREFILLED_SYRINGE | INTRAVENOUS | Status: DC | PRN
  Administered 2021-02-09: 60 mg via INTRAVENOUS

## 2021-02-09 MED ORDER — ACETAMINOPHEN 10 MG/ML IV SOLN: 1000.0000 mg | Freq: Once | INTRAVENOUS | Status: DC | PRN

## 2021-02-09 MED ORDER — OXYCODONE HCL 5 MG PO TABS
5.0000 mg | ORAL_TABLET | Freq: Once | ORAL | Status: AC
  Administered 2021-02-09: 5 mg via ORAL

## 2021-02-09 MED ORDER — CHLORHEXIDINE GLUCONATE 0.12 % MT SOLN: 15.0000 mL | Freq: Once | OROMUCOSAL | Status: AC

## 2021-02-09 MED ORDER — LIDOCAINE 2% (20 MG/ML) 5 ML SYRINGE
INTRAMUSCULAR | Status: AC
  Filled 2021-02-09: qty 5

## 2021-02-09 MED ORDER — MIDAZOLAM HCL 5 MG/5ML IJ SOLN
INTRAMUSCULAR | Status: DC | PRN
  Administered 2021-02-09: 2 mg via INTRAVENOUS

## 2021-02-09 MED ORDER — AMISULPRIDE (ANTIEMETIC) 5 MG/2ML IV SOLN: 10.0000 mg | Freq: Once | INTRAVENOUS | Status: DC | PRN

## 2021-02-09 MED ORDER — HYDROMORPHONE HCL 1 MG/ML IJ SOLN
0.2500 mg | INTRAMUSCULAR | Status: DC | PRN
  Administered 2021-02-09 (×2): 0.5 mg via INTRAVENOUS

## 2021-02-09 MED ORDER — PROPOFOL 10 MG/ML IV BOLUS
INTRAVENOUS | Status: AC
  Filled 2021-02-09: qty 20

## 2021-02-09 MED ORDER — ORAL CARE MOUTH RINSE: 15.0000 mL | Freq: Once | OROMUCOSAL | Status: DC

## 2021-02-09 MED ORDER — FENTANYL CITRATE (PF) 100 MCG/2ML IJ SOLN
INTRAMUSCULAR | Status: DC | PRN
  Administered 2021-02-09 (×2): 50 ug via INTRAVENOUS

## 2021-02-09 MED ORDER — FENTANYL CITRATE (PF) 250 MCG/5ML IJ SOLN
INTRAMUSCULAR | Status: AC
  Filled 2021-02-09: qty 5

## 2021-02-09 MED ORDER — CHLORHEXIDINE GLUCONATE 0.12 % MT SOLN
OROMUCOSAL | Status: AC
  Administered 2021-02-09: 15 mL via OROMUCOSAL
  Filled 2021-02-09: qty 15

## 2021-02-09 MED ORDER — ONDANSETRON HCL 4 MG/2ML IJ SOLN
INTRAMUSCULAR | Status: DC | PRN
  Administered 2021-02-09: 4 mg via INTRAVENOUS

## 2021-02-09 MED ORDER — PHENYLEPHRINE 40 MCG/ML (10ML) SYRINGE FOR IV PUSH (FOR BLOOD PRESSURE SUPPORT)
PREFILLED_SYRINGE | INTRAVENOUS | Status: DC | PRN
  Administered 2021-02-09 (×2): 80 ug via INTRAVENOUS

## 2021-02-09 MED ORDER — OXYCODONE HCL 5 MG PO TABS
ORAL_TABLET | ORAL | Status: AC
  Filled 2021-02-09: qty 1

## 2021-02-09 MED ORDER — GLYCOPYRROLATE PF 0.2 MG/ML IJ SOSY
PREFILLED_SYRINGE | INTRAMUSCULAR | Status: DC | PRN
  Administered 2021-02-09: .4 mg via INTRAVENOUS

## 2021-02-09 MED ORDER — GLYCOPYRROLATE PF 0.2 MG/ML IJ SOSY
PREFILLED_SYRINGE | INTRAMUSCULAR | Status: AC
  Filled 2021-02-09: qty 1

## 2021-02-09 MED ORDER — DEXAMETHASONE SODIUM PHOSPHATE 10 MG/ML IJ SOLN
INTRAMUSCULAR | Status: AC
  Filled 2021-02-09: qty 1

## 2021-02-09 MED ORDER — PROMETHAZINE HCL 25 MG/ML IJ SOLN: 6.2500 mg | INTRAMUSCULAR | Status: DC | PRN

## 2021-02-09 MED ORDER — 0.9 % SODIUM CHLORIDE (POUR BTL) OPTIME
TOPICAL | Status: DC | PRN
  Administered 2021-02-09: 1000 mL

## 2021-02-09 MED ORDER — ONDANSETRON HCL 4 MG/2ML IJ SOLN
INTRAMUSCULAR | Status: AC
  Filled 2021-02-09: qty 2

## 2021-02-09 MED ORDER — PROPOFOL 10 MG/ML IV BOLUS
INTRAVENOUS | Status: DC | PRN
  Administered 2021-02-09: 200 mg via INTRAVENOUS

## 2021-02-09 MED ORDER — OXYCODONE HCL 5 MG PO TABS
10.0000 mg | ORAL_TABLET | ORAL | Status: DC | PRN
  Administered 2021-02-09 – 2021-02-10 (×4): 10 mg via ORAL
  Filled 2021-02-09 (×5): qty 2

## 2021-02-09 SURGICAL SUPPLY — 39 items
BAG COUNTER SPONGE SURGICOUNT (BAG) ×2 IMPLANT
BAG ISL DRAPE 18X18 STRL (DRAPES) ×1
BAG ISOLATION DRAPE 18X18 (DRAPES) IMPLANT
BAG SPNG CNTER NS LX DISP (BAG) ×1
BNDG ELASTIC 4X5.8 VLCR STR LF (GAUZE/BANDAGES/DRESSINGS) IMPLANT
BNDG ELASTIC 6X5.8 VLCR STR LF (GAUZE/BANDAGES/DRESSINGS) IMPLANT
BNDG GAUZE ELAST 4 BULKY (GAUZE/BANDAGES/DRESSINGS) IMPLANT
CANISTER SUCT 3000ML PPV (MISCELLANEOUS) ×2 IMPLANT
CLIP LIGATING EXTRA MED SLVR (CLIP) ×2 IMPLANT
CLIP LIGATING EXTRA SM BLUE (MISCELLANEOUS) ×2 IMPLANT
COVER SURGICAL LIGHT HANDLE (MISCELLANEOUS) ×2 IMPLANT
DRAPE EXTREMITY T 121X128X90 (DISPOSABLE) IMPLANT
DRAPE HALF SHEET 40X57 (DRAPES) IMPLANT
DRAPE INCISE IOBAN 66X45 STRL (DRAPES) IMPLANT
DRAPE ISOLATION BAG 18X18 (DRAPES) ×2
DRAPE ORTHO SPLIT 77X108 STRL (DRAPES)
DRAPE SURG ORHT 6 SPLT 77X108 (DRAPES) IMPLANT
DRESSING PREVENA PLUS CUSTOM (GAUZE/BANDAGES/DRESSINGS) IMPLANT
DRSG ADAPTIC 3X8 NADH LF (GAUZE/BANDAGES/DRESSINGS) IMPLANT
DRSG PREVENA PLUS CUSTOM (GAUZE/BANDAGES/DRESSINGS) ×2
ELECT REM PT RETURN 9FT ADLT (ELECTROSURGICAL) ×2
ELECTRODE REM PT RTRN 9FT ADLT (ELECTROSURGICAL) ×1 IMPLANT
GAUZE SPONGE 4X4 12PLY STRL LF (GAUZE/BANDAGES/DRESSINGS) ×2 IMPLANT
GLOVE SURG ENC MOIS LTX SZ7.5 (GLOVE) ×2 IMPLANT
GOWN STRL REUS W/ TWL LRG LVL3 (GOWN DISPOSABLE) ×2 IMPLANT
GOWN STRL REUS W/ TWL XL LVL3 (GOWN DISPOSABLE) ×1 IMPLANT
GOWN STRL REUS W/TWL LRG LVL3 (GOWN DISPOSABLE) ×4
GOWN STRL REUS W/TWL XL LVL3 (GOWN DISPOSABLE) ×2
KIT BASIN OR (CUSTOM PROCEDURE TRAY) ×2 IMPLANT
KIT REMOVER STAPLE SKIN (MISCELLANEOUS) ×1 IMPLANT
KIT TURNOVER KIT B (KITS) ×2 IMPLANT
NS IRRIG 1000ML POUR BTL (IV SOLUTION) ×2 IMPLANT
PACK GENERAL/GYN (CUSTOM PROCEDURE TRAY) IMPLANT
PAD ARMBOARD 7.5X6 YLW CONV (MISCELLANEOUS) ×4 IMPLANT
SUT ETHILON 3 0 PS 1 (SUTURE) ×1 IMPLANT
SWAB CULTURE ESWAB REG 1ML (MISCELLANEOUS) ×1 IMPLANT
TOWEL GREEN STERILE (TOWEL DISPOSABLE) ×4 IMPLANT
TOWEL GREEN STERILE FF (TOWEL DISPOSABLE) ×2 IMPLANT
WATER STERILE IRR 1000ML POUR (IV SOLUTION) ×2 IMPLANT

## 2021-02-09 NOTE — Anesthesia Postprocedure Evaluation (Signed)
Anesthesia Post Note  Patient: Jose Hansen  Procedure(s) Performed: LEFT LEG WASHOUT AND WOUND CLOSURE (Left)     Patient location during evaluation: PACU Anesthesia Type: General Level of consciousness: awake and alert Vital Signs Assessment: post-procedure vital signs reviewed and stable Respiratory status: spontaneous breathing, nonlabored ventilation, respiratory function stable and patient connected to nasal cannula oxygen Cardiovascular status: blood pressure returned to baseline and stable Postop Assessment: no apparent nausea or vomiting Anesthetic complications: no Comments: Pain score approaching preoperative baseline.    No notable events documented.  Last Vitals:  Vitals:   02/09/21 1445 02/09/21 1505  BP: (!) 146/85 140/86  Pulse: 76 82  Resp: 14 16  Temp: 37.1 C 36.6 C  SpO2: 96% 100%                 Effie Berkshire

## 2021-02-09 NOTE — Care Management Important Message (Signed)
Important Message  Patient Details  Name: Jose Hansen MRN: [id number] Date of Birth: June 18, 1970   Medicare Important Message Given:  Yes Patient left prior to IM  Delivery will mail to the patient  Home address.      Danijah Noh 02/09/2021, 3:43 PM

## 2021-02-09 NOTE — Progress Notes (Signed)
Internal Medicine Clinic Attending  I saw and evaluated the patient.  I personally confirmed the key portions of the history and exam documented by Dr. Khan and I reviewed pertinent patient test results.  The assessment, diagnosis, and plan were formulated together and I agree with the documentation in the resident's note.  

## 2021-02-09 NOTE — Anesthesia Preprocedure Evaluation (Addendum)
Anesthesia Evaluation  Patient identified by MRN, date of birth, ID band Patient awake    Reviewed: Allergy & Precautions, NPO status , Patient's Chart, lab work & pertinent test results  Airway Mallampati: II  TM Distance: >3 FB Neck ROM: Full    Dental  (+) Teeth Intact, Dental Advisory Given   Pulmonary former smoker,    breath sounds clear to auscultation       Cardiovascular hypertension, + Peripheral Vascular Disease   Rhythm:Regular Rate:Normal     Neuro/Psych PSYCHIATRIC DISORDERS Depression    GI/Hepatic Neg liver ROS, GERD  ,  Endo/Other  negative endocrine ROS  Renal/GU negative Renal ROS     Musculoskeletal negative musculoskeletal ROS (+)   Abdominal Normal abdominal exam  (+)   Peds  Hematology negative hematology ROS (+)   Anesthesia Other Findings   Reproductive/Obstetrics                            Anesthesia Physical Anesthesia Plan  ASA: 3  Anesthesia Plan: General   Post-op Pain Management:    Induction: Intravenous  PONV Risk Score and Plan: 3 and Ondansetron, Dexamethasone and Midazolam  Airway Management Planned: LMA  Additional Equipment: None  Intra-op Plan:   Post-operative Plan: Extubation in OR  Informed Consent: I have reviewed the patients History and Physical, chart, labs and discussed the procedure including the risks, benefits and alternatives for the proposed anesthesia with the patient or authorized representative who has indicated his/her understanding and acceptance.     Dental advisory given  Plan Discussed with: CRNA  Anesthesia Plan Comments:        Anesthesia Quick Evaluation

## 2021-02-09 NOTE — Transfer of Care (Signed)
Immediate Anesthesia Transfer of Care Note  Patient: Jose Hansen  Procedure(s) Performed: LEFT LEG WASHOUT AND WOUND CLOSURE (Left)  Patient Location: PACU  Anesthesia Type:General  Level of Consciousness: drowsy, patient cooperative and responds to stimulation  Airway & Oxygen Therapy: Patient Spontanous Breathing and Patient connected to face mask oxygen  Post-op Assessment: Report given to RN and Post -op Vital signs reviewed and stable  Post vital signs: Reviewed and stable  Last Vitals:  Vitals Value Taken Time  BP    Temp    Pulse 85 02/09/21 1300  Resp 5 02/09/21 1300  SpO2 99 % 02/09/21 1300  Vitals shown include unvalidated device data.  Last Pain:  Vitals:   02/09/21 1133  TempSrc: Oral  PainSc:       Patients Stated Pain Goal: 1 ([id number])  Complications: No notable events documented.

## 2021-02-09 NOTE — Progress Notes (Signed)
HD#3 SUBJECTIVE:  Patient Summary: Jose Hansen is a 50 y.o. with a pertinent PMH of PVD s/p multiple vascular interventions, AK, GERD, depression, insomnia with auditory hallucinations, and recent surgical bypass for critical limb ischemia 2/2 PAD 01/20/2021, who presented with LLE pain and swelling and admitted for post-op complications s/p LLE surgical bypass for critical limb ischemia 2/2 PAD.   Overnight Events: None.  Interim History: Patient evaluated at bedside and reports continued pain.  He states that he is ready get the procedure on his leg done today.  He has no further complaints.  OBJECTIVE:  Vital Signs: Vitals:   02/08/21 1654 02/08/21 2052 02/09/21 0441 02/09/21 0834  BP: 125/75 120/65 126/77 111/67  Pulse: 74 76 73 74  Resp: '18 17 17 19  '$ Temp: (!) 97.4 F (36.3 C) 98.2 F (36.8 C) 97.7 F (36.5 C) 98.6 F (37 C)  TempSrc: Oral Oral Oral Oral  SpO2: 100% 99% 99% 96%   Supplemental O2: Room Air SpO2: 96 %  There were no vitals filed for this visit.   Intake/Output Summary (Last 24 hours) at 02/09/2021 1053 Last data filed at 02/09/2021 0908 Gross per 24 hour  Intake 1120 ml  Output --  Net 1120 ml   Net IO Since Admission: 3,145 mL [02/09/21 1053]  Physical Exam: Constitutional: Patient sitting comfortably in bed.  No acute distress. HENT: Eyes: Neck: Cardio: Regular rate and rhythm.  No murmurs, rubs, gallops. Pulm: Clear to auscultation bilaterally.  Normal work of breathing. Chest: Abdomen: Soft, nontender, nondistended. GU: MSK:S/p R AKA.  LLE tender to palpation particular around the incision site through the lower aspect of LLE.  Superficial wound dehiscence to proximal aspect of surgical incision site with wound dressing that is dry but evidence of serosanguineous drainage.  LLE is warm.  Skin: Skin is warm and dry. Neuro: Alert and oriented x3.  No focal deficit. Psych: Normal mood and affect.  Patient Lines/Drains/Airways Status      Active Line/Drains/Airways     Name Placement date Placement time Site Days   Peripheral IV 02/06/21 20 G 1.88" Anterior;Left;Proximal Forearm 02/06/21  0350  Forearm  3   Incision (Closed) 01/20/21 Groin Left 01/20/21  1907  -- 20   Incision (Closed) 01/20/21 Leg Left 01/20/21  1907  -- 20             ASSESSMENT/PLAN:  Assessment: Active Problems:   Leg swelling   Wound dehiscence   Plan: #Post-op pain and edema of LLE #L common femoral to posterior tibial artery bypass with prosthetic graft 08/24 #Superficial wound dehiscence Left lower extremity has a small area of superficial wound dehiscence to the proximal aspect of incision.  CTA performed on admission showed patent vasculature and graft, pulses were palpated via Doppler at that time.  He continues to be afebrile without leukocytosis and denies chills, malaise.  Left lower extremity is warm.  The incision continues to drain serosanguineous fluid. -Vascular surgery consulted, appreciate recommendations  -They are taking him in for a wound washout today.  There is concern that the bypass graft could be infected and if it is it will need to be resected which would be limb threatening.  They have counseled the patient on this possibility.  -He is currently on vancomycin 750 mg IV, Zosyn 3.375 g IV   -Per pharmacy, broad-spectrum antibiotics can be discontinued after surgery.   -Trend renal function - Dilaudid 1.0 q4h PRN pain, oxycodone 10 mg PO q4h PRN pain -  Continue home lyrica 100 mg TID  #PAD, multiple vascular procedures #R AKA #H/o R common femoral DVT Process of obtaining medical records from [place] was initiated yesterday. -Continue Eliquis 5 mg twice daily -Continue atorvastatin 80 mg daily  #Normocytic anemia Hemoglobin of 10.6 on a.m. labs.  Baseline of 11-12. -Trend CBC -Transfuse if hemoglobin less than 7  #GERD -Continue Pepcid 20 mg daily  #Chronic depression -Continue home Cymbalta 60  mg daily -Continue home Seroquel 150 mg daily  Best Practice: Diet: Cardiac diet IVF: Fluids: None VTE: Eliquis 5 mg BID Code: Full AB: Zosyn 3.375 g IV, vancomycin 750 mg IV DISPO: Anticipated discharge pending Medical stability.  Signature: Farrel Gordon, D.O.  Internal Medicine Resident, PGY-1 [facility]  Pager: [phone] 10:53 AM, 02/09/2021   Please contact the on call pager after 5 pm and on weekends at [phone].

## 2021-02-09 NOTE — Progress Notes (Signed)
Patient brought to 4E from PACU. VS stable. Patient states pain scale of 10/10. Pain medication given. Telemetry wall monitor applied, CCMD notified. Patient oriented to room and staff. Call bell in reach.  Daymon Larsen, RN

## 2021-02-09 NOTE — Care Management Important Message (Signed)
Important Message  Patient Details  Name: Jose Hansen MRN: [id number] Date of Birth: 10-23-1970   Medicare Important Message Given:  Yes  Patient left prior to IM  Delivery will mail to the patient  Home address.     Katerine Morua 02/09/2021, 2:37 PM

## 2021-02-09 NOTE — Anesthesia Procedure Notes (Signed)
Procedure Name: LMA Insertion Date/Time: 02/09/2021 12:05 PM Performed by: Michele Rockers, CRNA Pre-anesthesia Checklist: Patient identified, Emergency Drugs available, Suction available, Timeout performed and Patient being monitored Patient Re-evaluated:Patient Re-evaluated prior to induction Oxygen Delivery Method: Circle system utilized Preoxygenation: Pre-oxygenation with 100% oxygen Induction Type: IV induction LMA Size: 5.0 Number of attempts: 1 Tube secured with: Tape Dental Injury: Teeth and Oropharynx as per pre-operative assessment

## 2021-02-09 NOTE — Progress Notes (Signed)
  Progress Note    02/09/2021 11:50 AM Day of Surgery  Subjective: No overnight issues, denies fevers  Vitals:   02/09/21 0834 02/09/21 1133  BP: 111/67 (!) 142/83  Pulse: 74 73  Resp: 19 20  Temp: 98.6 F (37 C) 97.8 F (36.6 C)  SpO2: 96% 100%    Physical Exam: Awake alert oriented None the respirations Left medial leg incision with staples does have drainage on his bandage today, there is no erythema  CBC    Component Value Date/Time   WBC 7.7 02/09/2021 0042   RBC 3.97 (L) 02/09/2021 0042   HGB 10.6 (L) 02/09/2021 0042   HGB 15.4 12/03/2020 1202   HCT 33.6 (L) 02/09/2021 0042   HCT 47.0 12/03/2020 1202   PLT 279 02/09/2021 0042   PLT 288 12/03/2020 1202   MCV 84.6 02/09/2021 0042   MCV 81 12/03/2020 1202   MCH 26.7 02/09/2021 0042   MCHC 31.5 02/09/2021 0042   RDW 13.3 02/09/2021 0042   RDW 13.5 12/03/2020 1202   LYMPHSABS 2.3 02/07/2021 0237   LYMPHSABS 3.0 12/03/2020 1202   MONOABS 1.0 02/07/2021 0237   EOSABS 0.4 02/07/2021 0237   EOSABS 0.4 12/03/2020 1202   BASOSABS 0.1 02/07/2021 0237   BASOSABS 0.1 12/03/2020 1202    BMET    Component Value Date/Time   NA 138 02/08/2021 0923   NA 145 (H) 12/03/2020 1202   K 3.9 02/08/2021 0923   CL 106 02/08/2021 0923   CO2 23 02/08/2021 0923   GLUCOSE 168 (H) 02/08/2021 0923   BUN 10 02/08/2021 0923   BUN 11 12/03/2020 1202   CREATININE 1.14 02/08/2021 0923   CALCIUM 8.7 (L) 02/08/2021 0923   GFRNONAA >60 02/08/2021 0923    INR    Component Value Date/Time   INR 1.0 02/05/2021 0512     Intake/Output Summary (Last 24 hours) at 02/09/2021 1150 Last data filed at 02/09/2021 0908 Gross per 24 hour  Intake 880 ml  Output --  Net 880 ml     Assessment:  50 y.o. male previous left femoral to posterior tibial artery bypass with graft now with drainage.  Plan: OR today for washout of wound possible closure.  I discussed the risk of infection with and without surgery with the patient and that any  graft infection would likely require removal of the graft plus or minus placement of cryopreserved cadaver vein.  He demonstrates good understanding plan is for washout today.   Jenson Beedle C. Donzetta Matters, MD [facility] [place] Office: [phone] Pager: [phone]  02/09/2021 11:50 AM

## 2021-02-09 NOTE — Op Note (Signed)
    Patient name: Jose Hansen MRN: [id number] DOB: 04/05/1971 Sex: male  02/09/2021 Pre-operative Diagnosis: Dehiscence of left below-knee incision Post-operative diagnosis:  Same Surgeon:  Erlene Quan C. Donzetta Matters, MD Assistant: Arlee Muslim, PA Procedure Performed: 1.  Washout and closure of left below-knee bypass wound dehiscence 2.  Application of Prevena wound VAC  Indications: 50 year old male recently underwent a femoral to posterior tibial artery bypass graft with PTFE for rest pain.  He has a history of fasciotomy as well as vein harvest from below the knee had significant scar tissue there.  He returned to the hospital with drainage from the most cephalad aspect of the wound.  With concern for graft contamination we are planning to take him to the operating room for washout and closure.  An assistant was necessary to facilitate exposure and expedite the case.  Findings: The fluid in the wound was simple serous fluid there was no evidence of infection.  The graft was visualized.  The wound was thoroughly irrigated and the skin was reapproximated with mattress sutures and staples and a Prevena wound VAC was placed   Procedure:  The patient was identified in the holding area and taken to the operating room where he was placed supine on upper table and general anesthesia was induced.  He was sterilely prepped and draped in the left lower extremity usual fashion, antibiotics were ministered and a timeout was called.  4 staples were removed from the cephalad aspect of the wound where 3 staples had previously been removed.  We encountered significant serous fluid and sent cultures aerobic and anaerobic.  We then washed out all the fluid thoroughly with 1 L of warmed saline.  I do not think I will be able to get any soft tissue coverage over the graft so I primarily closed the skin with interrupted mattress sutures as well as staples.  A Prevena wound VAC was then fashioned at the skin level.  He was  awakened from anesthesia having tolerated procedure well without any complication.  All counts were correct at completion.  EBL: 10 cc.  Specimen: Aerobic/anaerobic culture.  Luci Bellucci C. Donzetta Matters, MD [facility] [place] Office: [phone] Pager: [phone]

## 2021-02-10 ENCOUNTER — Encounter (HOSPITAL_COMMUNITY): Payer: Self-pay | Admitting: Vascular Surgery

## 2021-02-10 DIAGNOSIS — Z9889 Other specified postprocedural states: Secondary | ICD-10-CM

## 2021-02-10 DIAGNOSIS — M7989 Other specified soft tissue disorders: Secondary | ICD-10-CM | POA: Diagnosis not present

## 2021-02-10 DIAGNOSIS — G8918 Other acute postprocedural pain: Secondary | ICD-10-CM | POA: Diagnosis not present

## 2021-02-10 DIAGNOSIS — T8130XA Disruption of wound, unspecified, initial encounter: Secondary | ICD-10-CM | POA: Diagnosis not present

## 2021-02-10 LAB — CBC WITH DIFFERENTIAL/PLATELET
Abs Immature Granulocytes: 0.12 10*3/uL — ABNORMAL HIGH (ref 0.00–0.07)
Basophils Absolute: 0 10*3/uL (ref 0.0–0.1)
Basophils Relative: 0 %
Eosinophils Absolute: 0 10*3/uL (ref 0.0–0.5)
Eosinophils Relative: 0 %
HCT: 34.6 % — ABNORMAL LOW (ref 39.0–52.0)
Hemoglobin: 11.2 g/dL — ABNORMAL LOW (ref 13.0–17.0)
Immature Granulocytes: 1 %
Lymphocytes Relative: 7 %
Lymphs Abs: 1 10*3/uL (ref 0.7–4.0)
MCH: 27.3 pg (ref 26.0–34.0)
MCHC: 32.4 g/dL (ref 30.0–36.0)
MCV: 84.2 fL (ref 80.0–100.0)
Monocytes Absolute: 1.3 10*3/uL — ABNORMAL HIGH (ref 0.1–1.0)
Monocytes Relative: 10 %
Neutro Abs: 11.4 10*3/uL — ABNORMAL HIGH (ref 1.7–7.7)
Neutrophils Relative %: 82 %
Platelets: 312 10*3/uL (ref 150–400)
RBC: 4.11 MIL/uL — ABNORMAL LOW (ref 4.22–5.81)
RDW: 13.2 % (ref 11.5–15.5)
WBC: 13.8 10*3/uL — ABNORMAL HIGH (ref 4.0–10.5)
nRBC: 0 % (ref 0.0–0.2)

## 2021-02-10 LAB — AEROBIC CULTURE W GRAM STAIN (SUPERFICIAL SPECIMEN): Gram Stain: NONE SEEN

## 2021-02-10 MED ORDER — APIXABAN 5 MG PO TABS
5.0000 mg | ORAL_TABLET | Freq: Two times a day (BID) | ORAL | Status: DC
  Administered 2021-02-10 – 2021-02-12 (×4): 5 mg via ORAL
  Filled 2021-02-10 (×4): qty 1

## 2021-02-10 MED ORDER — OXYCODONE HCL 5 MG PO TABS
10.0000 mg | ORAL_TABLET | Freq: Four times a day (QID) | ORAL | Status: DC
  Administered 2021-02-10 – 2021-02-11 (×5): 10 mg via ORAL
  Filled 2021-02-10 (×5): qty 2

## 2021-02-10 MED ORDER — DOXYCYCLINE HYCLATE 100 MG PO TABS
100.0000 mg | ORAL_TABLET | Freq: Two times a day (BID) | ORAL | Status: DC
  Administered 2021-02-10 – 2021-02-12 (×4): 100 mg via ORAL
  Filled 2021-02-10 (×4): qty 1

## 2021-02-10 MED ORDER — AMOXICILLIN-POT CLAVULANATE 875-125 MG PO TABS: 1.0000 | ORAL_TABLET | Freq: Two times a day (BID) | ORAL | Status: DC
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
## 2021-02-10 NOTE — TOC Initial Note (Addendum)
Transition of Care [facility]) - Initial/Assessment Note    Patient Details  Name: Jose Hansen MRN: [id number] Date of Birth: [birth year]  [facility] CM/SW Contact:    Carles Collet, RN Phone Number: 02/10/2021, 12:35 PM  Clinical Narrative:       Damaris Schooner w patient at bedside for assessment of home resources and needs. Patient admitted from home, brother's house, active w [facility] for home nursing and PT. Patient has WC and RW.  Patient states that he would like to be discharged to a skilled nursing facility. He cites his concern of potential infection, and dissatisfaction with living at his brother's house who has cats and dogs, and having an air mattress on the floor.     Reviewed with patient that he would have to have a skillable need for SNF services. Plans are for proveena wound vac, that will not require dressing changes. Patient does not have therapy needs that require SNF placement. Previous notes show that patient has received housing resources in August during last admission. Patient understands that he does not he does not demonstrate SNF criteria for admission at this time and that [facility] resources will be continued at [place]           Expected Discharge Plan: [place] Barriers to Discharge: Continued Medical Work up   Patient Goals and CMS Choice Patient states their goals for this hospitalization and ongoing recovery are:: wants SNF/ alternative housing   Choice offered to / list presented to : NA  Expected Discharge Plan and Services Expected Discharge Plan: [place]   Discharge Planning Services: CM Consult   Living arrangements for the past 2 months: Single Family Home                                      Prior Living Arrangements/Services Living arrangements for the past 2 months: Single Family Home Lives with:: Siblings                   Activities of Daily Living      Permission Sought/Granted                   Emotional Assessment              Admission diagnosis:  Leg swelling [M79.89] Post-op pain [G89.18] Wound dehiscence [T81.30XA] Patient Active Problem List   Diagnosis Date Noted   Leg swelling 02/05/2021   Post-op pain    Wound dehiscence    GERD (gastroesophageal reflux disease) 02/04/2021   Ischemia of extremity 01/14/2021   Hx of AKA (above knee amputation), right ([place]) 01/13/2021   Insomnia 01/08/2021   Right arm pain 12/03/2020   Depression 12/03/2020   Prediabetes 12/03/2020   DVT (deep venous thrombosis) ([place]) 11/24/2020   Essential hypertension 11/24/2020   PVD (peripheral vascular disease) ([place]) 11/24/2020   Hyperlipidemia 11/24/2020   PCP:  [name], MD Pharmacy:   [id number] [id number] [id number]: [phone]: [id number] [place]Phone: [phone]: [address] [place]Phone: [phone]: [phone]    Social Determinants of Health (SDOH) Interventions    Readmission Risk  Interventions Readmission Risk Prevention Plan 01/26/2021  Transportation Screening Complete  PCP or Specialist Appt within 5-7 Days Complete  Home Care Screening Complete  Medication Review (RN CM) Complete  Some recent data might be hidden

## 2021-02-10 NOTE — Progress Notes (Signed)
HD#4 SUBJECTIVE:  Patient Summary: Jose Hansen is a 50 y.o. with a pertinent PMH of PVD s/p multiple vascular interventions, AK, GERD, depression, insomnia with auditory hallucinations, and recent surgical bypass for critical limb ischemia 2/2 PAD 01/20/2021, who presented with LLE pain and swelling and admitted for post-op complications s/p LLE surgical bypass for critical limb ischemia 2/2 PAD.   Overnight Events: None  Interim History: Patient evaluated at bedside.  He does continue to have pain and asks about patient controlled pain medication drip.  He is hesitant to discharge to home as he lives with his brother who has multiple animals and he is concerned about the level of cleanliness and infection risk to the incision.  Patient was counseled on barrier to discharge of transitioning him from oral pain management to infusion.  He did not want to make any changes that would compromise his ability to discharge.  We also counseled him on having PT/OT to evaluate him for recommendations regarding discharge location.  We did assure him that his wound was closed with deep sutures as well as staples.  OBJECTIVE:  Vital Signs: Vitals:   02/09/21 2356 02/10/21 0508 02/10/21 0754 02/10/21 1239  BP: (!) 159/78 123/70 (!) 142/90 (!) 151/91  Pulse: 100 87 90 88  Resp: '20 20 16 17  '$ Temp: 98.6 F (37 C) 98.5 F (36.9 C) 98.3 F (36.8 C) 98.3 F (36.8 C)  TempSrc: Oral Oral Oral Oral  SpO2: 94% 97% 95% 96%  Weight:      Height:       Supplemental O2: Room Air SpO2: 96 % O2 Flow Rate (L/min): 2 L/min  Filed Weights   02/09/21 1133 02/09/21 1505  Weight: 100.2 kg 103 kg     Intake/Output Summary (Last 24 hours) at 02/10/2021 1518 Last data filed at 02/10/2021 1100 Gross per 24 hour  Intake 660 ml  Output 2650 ml  Net -1990 ml   Net IO Since Admission: 1,625 mL [02/10/21 1518]  Physical Exam: Constitutional: Patient is sitting upright in bed in no acute distress. Cardio:  Regular rate and rhythm.  No murmurs/rubs/gallops. Pulm: Clear to auscultation bilaterally.  Normal effort of breathing. Abdomen: Soft, nondistended, nontender. MSK: Normal bulk and tone.  Status post right AKA.  There is a wound VAC over the incision site from procedure yesterday to right lower extremity. Skin: Warm, dry. Neuro: Alert and oriented x3.  No focal deficit noted. Psych: Normal mood and affect.   Patient Lines/Drains/Airways Status     Active Line/Drains/Airways     Name Placement date Placement time Site Days   Peripheral IV 02/06/21 20 G 1.88" Anterior;Left;Proximal Forearm 02/06/21  0350  Forearm  4   Negative Pressure Wound Therapy Pretibial Left 02/09/21  1250  --  1   Incision (Closed) 01/20/21 Groin Left 01/20/21  1907  -- 21   Incision (Closed) 01/20/21 Leg Left 01/20/21  1907  -- 21   Incision (Closed) 02/09/21 Leg Left 02/09/21  1244  -- 1             ASSESSMENT/PLAN:  Assessment: Active Problems:   Leg swelling   Wound dehiscence   Plan: ##Post-op pain and edema of LLE #L common femoral to posterior tibial artery bypass with prosthetic graft 08/24 #Superficial wound dehiscence Patient is POD 1 from incision and drainage to left lower extremity.  Vascular surgery noted during this procedure that there was no evidence of active infection.  The incision was closed with deep  mattress sutures as well as staples and covered with wound VAC. -Vascular surgery consulted, appreciate recommendations.  -Patient will need to be discharged with wound VAC -PT/OT consulted, appreciate recommendations  -Patient is being recommended to discharge to home with home health PT -Dilaudid 1 mg every 4 hours as needed pain, oxycodone 10 mg p.o. every 4 hours as needed pain -Continue home Lyrica 100 mg 3 times daily  ##PAD, multiple vascular procedures #R AKA #H/o R common femoral DVT -Continue Eliquis 5 mg twice daily -Continue atorvastatin 80 mg daily  #Normocytic  anemia Hemoglobin of 11.2 on a.m. labs.  Baseline of 11-12. He does have a leukocytosis of 13.8 which may be in response to operation yesterday. -Trend CBC -Transfuse if hemoglobin less than 7   #GERD -Continue Pepcid 20 mg daily   #Chronic depression -Continue home Cymbalta 60 mg daily -Continue home Seroquel 150 mg daily  Best Practice: Diet: Cardiac diet IVF: Fluids: None VTE:   Eliquis 5 mg twice daily Code: Full AB: None Therapy Recs: Home Health DISPO: Anticipated discharge in 1-3 days to Home pending Medical stability.  Signature: Farrel Gordon, D.O.  Internal Medicine Resident, PGY-1 [facility]  Pager: [phone] 3:18 PM, 02/10/2021   Please contact the on call pager after 5 pm and on weekends at [phone].

## 2021-02-10 NOTE — Progress Notes (Addendum)
  Progress Note    02/10/2021 8:14 AM 1 Day Post-Op  Subjective:  Pain L leg.  Feels like edema of LLE has improved   Vitals:   02/10/21 0508 02/10/21 0754  BP: 123/70 (!) 142/90  Pulse: 87 90  Resp: 20 16  Temp: 98.5 F (36.9 C) 98.3 F (36.8 C)  SpO2: 97% 95%   Physical Exam: Lungs:  non labored Incisions:  L lower leg with prevena vac in place; good seal; serosanguinous collection in cannister Extremities:  palpable L PTA Neurologic: A&O  CBC    Component Value Date/Time   WBC 7.7 02/09/2021 0042   RBC 3.97 (L) 02/09/2021 0042   HGB 10.6 (L) 02/09/2021 0042   HGB 15.4 12/03/2020 1202   HCT 33.6 (L) 02/09/2021 0042   HCT 47.0 12/03/2020 1202   PLT 279 02/09/2021 0042   PLT 288 12/03/2020 1202   MCV 84.6 02/09/2021 0042   MCV 81 12/03/2020 1202   MCH 26.7 02/09/2021 0042   MCHC 31.5 02/09/2021 0042   RDW 13.3 02/09/2021 0042   RDW 13.5 12/03/2020 1202   LYMPHSABS 2.3 02/07/2021 0237   LYMPHSABS 3.0 12/03/2020 1202   MONOABS 1.0 02/07/2021 0237   EOSABS 0.4 02/07/2021 0237   EOSABS 0.4 12/03/2020 1202   BASOSABS 0.1 02/07/2021 0237   BASOSABS 0.1 12/03/2020 1202    BMET    Component Value Date/Time   NA 138 02/08/2021 0923   NA 145 (H) 12/03/2020 1202   K 3.9 02/08/2021 0923   CL 106 02/08/2021 0923   CO2 23 02/08/2021 0923   GLUCOSE 168 (H) 02/08/2021 0923   BUN 10 02/08/2021 0923   BUN 11 12/03/2020 1202   CREATININE 1.14 02/08/2021 0923   CALCIUM 8.7 (L) 02/08/2021 0923   GFRNONAA >60 02/08/2021 0923    INR    Component Value Date/Time   INR 1.0 02/05/2021 0512     Intake/Output Summary (Last 24 hours) at 02/10/2021 0814 Last data filed at 02/10/2021 [id number] Gross per 24 hour  Intake 800 ml  Output 2680 ml  Net -1880 ml     Assessment/Plan:  50 y.o. male is s/p L femoral to popliteal bypass with PTFE and subsequent popliteal washout 1 Day Post-Op   L foot well perfused with palpable PTA pulse Continue prevena vac LLE; good  seal Continue broad spectrum abx pending culture results   Dagoberto Ligas, PA-C [facility] [phone] 02/10/2021 8:14 AM  I have independently interviewed and examined patient and agree with PA assessment and plan above.  Following up culture results, continue broad-spectrum antibiotics at this time.  Thin serosanguineous fluid in his canister today with palpable posterior tibial pulse.  TOC evaluating discharge options.  He will need Prevena wound VAC on discharge as well as antibiotics.  Jahron Hunsinger C. Donzetta Matters, MD [facility] [place] Office: [phone] Pager: [phone]

## 2021-02-11 DIAGNOSIS — T8130XA Disruption of wound, unspecified, initial encounter: Secondary | ICD-10-CM | POA: Diagnosis not present

## 2021-02-11 DIAGNOSIS — G8918 Other acute postprocedural pain: Secondary | ICD-10-CM | POA: Diagnosis not present

## 2021-02-11 DIAGNOSIS — M7989 Other specified soft tissue disorders: Secondary | ICD-10-CM | POA: Diagnosis not present

## 2021-02-11 MED ORDER — OXYCODONE HCL 5 MG PO TABS
15.0000 mg | ORAL_TABLET | Freq: Four times a day (QID) | ORAL | Status: DC
  Administered 2021-02-11 – 2021-02-12 (×4): 15 mg via ORAL
  Filled 2021-02-11 (×4): qty 3

## 2021-02-11 NOTE — Progress Notes (Signed)
HD#5 SUBJECTIVE:  Patient Summary: Jose Hansen is a 50 y.o. with a pertinent PMH of PVD status post multiple vascular interventions, R AKA, GERD, depression, insomnia with auditory hallucinations, recent surgical bypass for critical limb ischemia secondary to PAD 01/20/2021, who presented with LLE pain and swelling and admitted for postop complications status post LLE surgical bypass for critical limb ischemia secondary to PAD.   Overnight Events: None  Interim History: Patient evaluated at bedside.  He reports feeling generally okay, though he does have pain at the surgical site.  He does still have concerns regarding infection risk when going home but was reassured by our conversation.  We indicated to him that he is medically stable for discharge, and he expressed that his only barrier to discharge is that he is going home to his brother's house and his brother works on the road and just left yesterday. Typically his brother normally is gone for 2 to 3 days at a time.  He is going to reach out to his brother for more information regarding when he will be home today.  He is medically stable for discharge pending discharge location.  OBJECTIVE:  Vital Signs: Vitals:   02/10/21 2329 02/11/21 0344 02/11/21 0826 02/11/21 1227  BP: (!) 121/91 (!) 109/58 133/82 (!) 136/91  Pulse: 84 82 75   Resp: '18 17 18 14  '$ Temp: 98.5 F (36.9 C) 98.6 F (37 C) 98.3 F (36.8 C) 98.1 F (36.7 C)  TempSrc: Oral Oral Oral Oral  SpO2: 94% 97% 100% 100%  Weight:      Height:       Supplemental O2: Room Air SpO2: 100 % O2 Flow Rate (L/min): 2 L/min  Filed Weights   02/09/21 1133 02/09/21 1505  Weight: 100.2 kg 103 kg     Intake/Output Summary (Last 24 hours) at 02/11/2021 1551 Last data filed at 02/11/2021 1252 Gross per 24 hour  Intake 240 ml  Output 900 ml  Net -660 ml   Net IO Since Admission: 965 mL [02/11/21 1551]  Physical Exam: Constitutional: Patient is resting in bed.  No acute  distress. Cardio: Regular rate and rhythm.  No murmurs/rubs/gallops. Pulm: Clear to auscultation bilaterally.  Normal effort of breathing. Abdomen: Soft, nondistended, nontender. [id number] post right AKA.  Normal bulk and tone of LLE.  There is a wound VAC over the incision site from left lower extremity wound washout 9/13. Skin: Warm, dry. Neuro: Alert and oriented x3.  No focal deficit noted. Psych: Normal mood and affect.   Patient Lines/Drains/Airways Status     Active Line/Drains/Airways     Name Placement date Placement time Site Days   Peripheral IV 02/11/21 24 G 1.25" Anterior;Right Forearm 02/11/21  0610  Forearm  less than 1   Negative Pressure Wound Therapy Pretibial Left 02/09/21  1250  --  2   Incision (Closed) 01/20/21 Groin Left 01/20/21  1907  -- 22   Incision (Closed) 01/20/21 Leg Left 01/20/21  1907  -- 22   Incision (Closed) 02/09/21 Leg Left 02/09/21  1244  -- 2             ASSESSMENT/PLAN:  Assessment: Active Problems:   Leg swelling   Wound dehiscence   Plan: #Post-op pain and edema of LLE #L common femoral to posterior tibial artery bypass with prosthetic graft 08/24 #Superficial wound dehiscence Patient is POD 2 from incision and drainage to left lower extremity.  Wound cultures have shown no growth to this point however  previous superficial cultures from 09/11 note MRSA.  Patient has expressed concerns regarding returning home due to fears of infection.  He states that at home there are multiple animals and he is afraid of there being an increased risk of infection.  I have counseled him on the fact that his incision has been closed with deep sutures as well as staples and that he will go home with the wound VAC.  I also informed him that he will go home on antibiotics which will be another measure to help prevent infection. -VSS consulted, appreciate recommendations  -Patient will need to be discharged with wound VAC.  We will need to ensure he has  supplies for this.  -He is currently on doxycycline 100 mg BID and will need to have treatment for 2 weeks. -PT/OT consulted, appreciate recommendations  -Recommendation is home with home health PT -Dilaudid 1 mg IV every 4 hours as needed for pain, oxycodone 10 mg p.o. every 4 hours as needed for pain -Continue home Lyrica 100 mg 3 times daily  #Normocytic anemia Hemoglobin has been stable throughout admission.  #GERD -Continue Pepcid 20 mg daily  #Chronic depression -Continue home Cymbalta 60 mg daily -Continue home Seroquel 150 mg daily  Best Practice: Diet: Cardiac diet IVF: Fluids: None VTE:   Eliquis 5 mg twice daily Code: Full AB: Doxycycline 100 mg twice daily Therapy Recs: Home Health DISPO: Anticipated discharge in 1-3 days to Home pending Home enviroment access/layout.  Signature: Farrel Gordon, D.O.  Internal Medicine Resident, PGY-1 [facility]  Pager: [phone] 3:51 PM, 02/11/2021   Please contact the on call pager after 5 pm and on weekends at [phone].

## 2021-02-11 NOTE — Progress Notes (Addendum)
[facility]  Subjective  - Pain at incision site   Objective (!) 109/58 82 98.6 F (37 C) (Oral) 17 97%  Intake/Output Summary (Last 24 hours) at 02/11/2021 0720 Last data filed at 02/10/2021 1821 Gross per 24 hour  Intake 360 ml  Output 50 ml  Net 310 ml    Doppler PT signal left LE Incisional vac in place SS drainage 250 total Compartments soft Lungs non labored breathing  Assessment/Planning: POD # 2 Washout and closure of left below-knee bypass wound dehiscence.  Application of Prevena wound VAC  Left LE well perfuse with PT signal Pending culture final  METHICILLIN RESISTANT STAPHYLOCOCCUS AUREUS currently on Doxycycline  He will need Prevena wound VAC on discharge as well as antibiotics. PT ordered yesterday they have not seen him yesterday this admission. Plan for f/u with PA in 1 week for incision   Jose Hansen 02/11/2021 7:20 AM --  Laboratory Lab Results: Recent Labs    02/09/21 0042 02/10/21 0817  WBC 7.7 13.8*  HGB 10.6* 11.2*  HCT 33.6* 34.6*  PLT 279 312   BMET Recent Labs    02/08/21 0923  NA 138  K 3.9  CL 106  CO2 23  GLUCOSE 168*  BUN 10  CREATININE 1.14  CALCIUM 8.7*    COAG Lab Results  Component Value Date   INR 1.0 02/05/2021   INR 1.0 01/12/2021   INR 1.0 11/24/2020   No results found for: PTT  I have independently interviewed and examined patient and agree with PA assessment and plan above.  We will need antibiotics for home I would think for at least 2 weeks given underlying graft.  Arletha Pili will also be continued.  All fluid appears simple and serosanguineous at this time and did not appear infected at the time of surgery either.  Daiveon Markman C. Donzetta Matters, MD [facility] Office: [phone] Pager: [phone]

## 2021-02-11 NOTE — Progress Notes (Signed)
Physical Therapy Treatment Patient Details Name: Jose Hansen MRN: [id number] DOB: [birth year] Today's Date: 02/11/2021   History of Present Illness Pt is 50 yo male with severe PVD s/p multiple vascular interventions and R AKA. He had recent L femoral to posterior tibial artery bypass for critical limb ischemia due to PAD on 01/20/21 and presented on 02/04/21 with increased leg swelling and pain. Pt with dehiscence of incision and drainage that required washout and closure with Prevena wound vac placement on 02/09/21, Other hx includes GERD, deprssion, insomnia with auditory hallucinations.    PT Comments    Pt agreeable to session with focus on use of crutches for mobility to mimic home set-up. The pt was able to demo good stability with use of crutches for ambulation in the room and reports no fatigue at this time. Mobility limited by pain in L foot. Discussed possible ways for self massage and movement of L foot to decrease sensitivity and improve function prior to initial sit-stand transfers at home as the pt states this is the main cause of his frequent falls at home. The pt voiced interest in a power WC to allow for greater participation in community events and greater independence with mobility outside of the home.    Recommendations for follow up therapy are one component of a multi-disciplinary discharge planning process, led by the attending physician.  Recommendations may be updated based on patient status, additional functional criteria and insurance authorization.  Follow Up Recommendations  Home health PT;Supervision for mobility/OOB     Equipment Recommendations  Other (comment) (pt asking for motorized WC)    Recommendations for Other Services       Precautions / Restrictions Precautions Precautions: Fall Precaution Comments: Rt AKA, pt reports several falls in the last year Restrictions Weight Bearing Restrictions: No     Mobility  Bed Mobility Overal bed mobility:  Independent Bed Mobility: Supine to Sit;Sit to Supine;Rolling Rolling: Independent   Supine to sit: Independent Sit to supine: Independent   General bed mobility comments: Pt long sitting at arrival; no issues with bed mobiltiy.    Transfers Overall transfer level: Needs assistance Equipment used: Crutches Transfers: Sit to/from [facility] Sit to Stand: Min guard Stand pivot transfers: Min guard       General transfer comment: minG for safety, pt able to complete without assist or LOB, but states this movement accounts for most of his falls  Ambulation/Gait Ambulation/Gait assistance: Min guard Gait Distance (Feet): 30 Feet Assistive device: Crutches   Gait velocity: decr Gait velocity interpretation: <1.31 ft/sec, indicative of household ambulator General Gait Details: hop-to pattern with no overt LOB, good stability with crutches       Balance Overall balance assessment: Needs assistance Sitting-balance support: Feet supported Sitting balance-Leahy Scale: Normal     Standing balance support: Bilateral upper extremity supported Standing balance-Leahy Scale: Fair Standing balance comment: pt able to stand with single UE support on crutches, improved stability with BUE support due to AKA                            Cognition Arousal/Alertness: Awake/alert Behavior During Therapy: WFL for tasks assessed/performed Overall Cognitive Status: Within Functional Limits for tasks assessed                                 General Comments: VSS on RA  Pertinent Vitals/Pain Pain Assessment: Faces Faces Pain Scale: Hurts whole lot Pain Location: L foot (cramping) Pain Descriptors / Indicators: Cramping;Discomfort Pain Intervention(s): Limited activity within patient's tolerance;Monitored during session;Repositioned     PT Goals (current goals can now be found in the care plan section) Acute Rehab PT Goals Patient  Stated Goal: heal leg; no more surgeries PT Goal Formulation: With patient Time For Goal Achievement: 02/24/21 Potential to Achieve Goals: Good Progress towards PT goals: Progressing toward goals    Frequency    Min 3X/week      PT Plan Current plan remains appropriate       AM-PAC PT "6 Clicks" Mobility   Outcome Measure  Help needed turning from your back to your side while in a flat bed without using bedrails?: None Help needed moving from lying on your back to sitting on the side of a flat bed without using bedrails?: None Help needed moving to and from a bed to a chair (including a wheelchair)?: A Little Help needed standing up from a chair using your arms (e.g., wheelchair or bedside chair)?: A Little Help needed to walk in hospital room?: A Little Help needed climbing 3-5 steps with a railing? : A Little 6 Click Score: 20    End of Session Equipment Utilized During Treatment: Gait belt Activity Tolerance: Patient tolerated treatment well Patient left: in bed;with call bell/phone within reach;with bed alarm set Nurse Communication: Mobility status PT Visit Diagnosis: Other abnormalities of gait and mobility (R26.89);Difficulty in walking, not elsewhere classified (R26.2);Pain;Muscle weakness (generalized) (M62.81) Pain - Right/Left: Left Pain - part of body: Leg     Time: [id number] PT Time Calculation (min) (ACUTE ONLY): 30 min  Charges:  $Gait Training: 8-22 mins $Therapeutic Activity: 8-22 mins                     West Carbo, PT, DPT   Acute Rehabilitation Department Pager #: [phone]   Sandra Cockayne 02/11/2021, 4:03 PM

## 2021-02-12 ENCOUNTER — Other Ambulatory Visit (HOSPITAL_COMMUNITY): Payer: Self-pay

## 2021-02-12 DIAGNOSIS — M792 Neuralgia and neuritis, unspecified: Secondary | ICD-10-CM

## 2021-02-12 MED ORDER — PREGABALIN 100 MG PO CAPS
100.0000 mg | ORAL_CAPSULE | Freq: Three times a day (TID) | ORAL | 0 refills | Status: DC
  Filled 2021-02-12: qty 90, 30d supply, fill #0

## 2021-02-12 MED ORDER — METHOCARBAMOL 1000 MG/10ML IJ SOLN
500.0000 mg | Freq: Four times a day (QID) | INTRAVENOUS | Status: DC | PRN
  Administered 2021-02-12: 500 mg via INTRAVENOUS
  Filled 2021-02-12 (×2): qty 5

## 2021-02-12 MED ORDER — QUETIAPINE FUMARATE 50 MG PO TABS
150.0000 mg | ORAL_TABLET | Freq: Every day | ORAL | 3 refills | Status: DC
  Filled 2021-02-12 – 2021-03-01 (×3): qty 90, 30d supply, fill #0
  Filled 2021-04-01: qty 90, 30d supply, fill #1

## 2021-02-12 MED ORDER — METHOCARBAMOL 500 MG PO TABS
1000.0000 mg | ORAL_TABLET | Freq: Three times a day (TID) | ORAL | 0 refills | Status: DC
  Filled 2021-02-12: qty 21, 4d supply, fill #0

## 2021-02-12 MED ORDER — OXYCODONE HCL 15 MG PO TABS
15.0000 mg | ORAL_TABLET | Freq: Four times a day (QID) | ORAL | 0 refills | Status: AC
  Filled 2021-02-12: qty 30, 7d supply, fill #0

## 2021-02-12 MED ORDER — DULOXETINE HCL 60 MG PO CPEP
60.0000 mg | ORAL_CAPSULE | Freq: Every day | ORAL | 2 refills | Status: DC
  Filled 2021-02-12 – 2021-03-01 (×3): qty 30, 30d supply, fill #0
  Filled 2021-04-06: qty 30, 30d supply, fill #1

## 2021-02-12 MED ORDER — DOXYCYCLINE HYCLATE 100 MG PO TABS
100.0000 mg | ORAL_TABLET | Freq: Two times a day (BID) | ORAL | 0 refills | Status: DC
  Filled 2021-02-12: qty 23, 12d supply, fill #0

## 2021-02-12 NOTE — Discharge Summary (Signed)
Name: Jose Hansen MRN: [id number] DOB: Aug 20, 1970 50 y.o. PCP: [name], MD  Date of Admission: 02/04/2021  5:28 PM Date of Discharge: 02/12/2021  Attending Physician: Dr. Daryll Drown  DISCHARGE DIAGNOSIS:  Primary Problem: Wound dehiscence   Hospital Problems: Principal Problem:   Wound dehiscence Active Problems:   Leg swelling    DISCHARGE MEDICATIONS:   Allergies as of 02/12/2021       Reactions   Contrast Media [iodinated Diagnostic Agents] Nausea Only   NOT AN ALLERGY. ONLY NAUSEA. 12/11/20 Newly reported by [facility] on 12/04/20.        Medication List     STOP taking these medications    HYDROcodone Bitartrate ER 15 MG Cp12       TAKE these medications    acetaminophen 325 MG tablet Commonly known as: TYLENOL Take 2 tablets (650 mg total) by mouth every 6 (six) hours as needed for moderate pain or mild pain.   aspirin EC 81 MG tablet Take 81 mg by mouth daily. Swallow whole.   atorvastatin 80 MG tablet Commonly known as: Lipitor Take 1 tablet (80 mg total) by mouth daily.   doxycycline 100 MG tablet Commonly known as: VIBRA-TABS Take 1 tablet (100 mg total) by mouth every 12 (twelve) hours for 23 doses.   DULoxetine 60 MG capsule Commonly known as: Cymbalta Take 1 capsule (60 mg total) by mouth daily.   Eliquis 5 MG Tabs tablet Generic drug: apixaban Take 1 tablet (5 mg total) by mouth 2 (two) times daily.   famotidine 20 MG tablet Commonly known as: PEPCID Take 1 tablet (20 mg total) by mouth daily.   methocarbamol 500 MG tablet Commonly known as: Robaxin Take 2 tablets (1,000 mg total) by mouth 3 (three) times daily.   oxyCODONE 15 MG immediate release tablet Commonly known as: ROXICODONE Take 1 tablet (15 mg total) by mouth every 6 (six) hours for 5 days.   pregabalin 100 MG capsule Commonly known as: LYRICA Take 1 capsule (100 mg total) by mouth 3 (three) times daily.   QUEtiapine 50 MG tablet Commonly known as:  SEROQUEL Take 3 tablets (150 mg total) by mouth at bedtime.               Discharge Care Instructions  (From admission, onward)           Start     Ordered   02/12/21 0000  Leave dressing on - Keep it clean, dry, and intact until clinic visit        02/12/21 1159            DISPOSITION AND FOLLOW-UP:  [name] was discharged from [facility] in Stable condition. At the hospital follow up visit please address:  #Post-op pain and edema of LLE #L common femoral to posterior tibial artery bypass with prosthetic graft 08/24 #Wound dehiscence Ensure completion of antibiotic treatment.   #PAD, multiple vascular procedures #R AKA #H/o R common femoral DVT Encourage patient to decrease modifiable risk factors.   #Normocytic anemia #GERD #Chronic depression Conditions that are managed chronically.   Follow-up Recommendations: Consults: [facility] Labs: CBC Studies: None Medications: Continue doxycycline 100 mg twice daily for full 2-week course, discharged with will discharge with Robaxin 500 mg 3 times daily as needed for muscle spasms, oxycodone 15 mg every 6 hours as needed for severe pain.  Follow-up Appointments:  Follow-up Information     Care, [facility] Follow up.   Specialty:  Home Health Services Why: resume [facility] RN/PT Contact information: [place] [address] [phone]         Waynetta Sandy, MD Follow up in 1 week(s).   Specialties: Vascular Surgery, Cardiology Why: Office will call you to arrange your appt (sent) Contact information: [address] [place] [address] [phone]                 HOSPITAL COURSE:  Patient Summary: #Post-op pain and edema of LLE #L common femoral to posterior tibial artery bypass with prosthetic graft 08/24 #Wound dehiscence Patient presented with pain, swelling to LLE and a small area of wound dehiscence of proximal aspect of  incision to lower leg with serosanguinous drainage.  CTA showed patent vasculature and graft, pulses palpated via doppler. He was initiated on vancomycin 1500 mg IV one dose and ceftriaxone 1g IV for one dose. This was transitioned to vancomycin 750 mg IV and Zosyn 3.375 g IV on 09/09.  Superficial cultures from the wound on 09/11 did note MRSA.  On 09/13 vascular surgery did take the patient for a wound washout.  During this procedure there was no evidence of active infection noted.  Wound cultures from this procedure did not have growth.  The incision was closed with deep mattress sutures as well as staples and covered with wound VAC.  He was initiated on doxycycline 100 mg twice daily and will complete this course after 2 weeks of treatment.  Home health PT order placed on discharge.   #PAD, multiple vascular procedures #R AKA #H/o R common femoral DVT Patient has undergone the majority of his vascular interventions in [place]. He reported multiple RLE interventions but ultimately required R AKA. He initiated care with [facility] in June with complaints of claudication and CTA that showed patent left iliac stents, occlusion from the right iliac all the way down into the right SFA, occlusion of L superficial femoral artery and popliteal artery, and reconstitution of the distal posterior tibial and peroneal artery on the left leg. On 01/20/2021 he underwent operation for femoral bypass with gore propaten vascular graft.  Ultrasound 09/09 showed no obstruction to LLE vasculature, fluid in the groin; no obstruction to RLE vasculature. ABI of LLE 09/09 was WNL without signs of significant arterial disease. He was continued on home Eliquis throughout his stay.   #Normocytic anemia #GERD #Chronic depression Conditions that are managed chronically. Supportive care provided throughout stay.   DISCHARGE INSTRUCTIONS:   Discharge Instructions     Call MD for:  persistant nausea and vomiting   Complete by: As  directed    Call MD for:  temperature >100.4   Complete by: As directed    Diet - low sodium heart healthy   Complete by: As directed    Increase activity slowly   Complete by: As directed    Leave dressing on - Keep it clean, dry, and intact until clinic visit   Complete by: As directed        SUBJECTIVE:  Patient evaluated at bedside on day of discharge.  He complained of muscle spasms, and Dr. Gwenlyn Saran had added IV Robaxin for this prior to my examination.  I counseled the patient on discharge medications including antibiotic regimen, pain control, muscle spasms control.  He voiced understanding.  Discharge Vitals:   BP 134/84 (BP Location: Right Arm)   Pulse 75   Temp 98.1 F (36.7 C) (Oral)   Resp 17   Ht '5\' 11"'$  (1.803  m)   Wt 103 kg   SpO2 99%   BMI 31.67 kg/m   OBJECTIVE:  Constitutional: Patient sitting up in bed.  No acute distress noted. Cardio: Regular rate and rhythm.  No murmurs, rubs, gallops. Pulm: Clear to auscultation bilaterally.  Normal effort of breathing. Abdomen: Soft, nondistended, nontender. MSK: S/P right AKA.  Normal bulk and tone of LLE.  There is wound VAC over the incision site of left lower extremity wound.  He does exhibit muscle spasms actively during physical exam. Skin: Warm, dry. Neuro: Alert and oriented x3.  No focal deficit noted. Psych: Normal mood and affect.  Pertinent Labs, Studies, and Procedures:  CBC Latest Ref Rng & Units 02/10/2021 02/09/2021 02/08/2021  WBC 4.0 - 10.5 K/uL 13.8(H) 7.7 7.2  Hemoglobin 13.0 - 17.0 g/dL 11.2(L) 10.6(L) 11.0(L)  Hematocrit 39.0 - 52.0 % 34.6(L) 33.6(L) 34.9(L)  Platelets 150 - 400 K/uL 312 279 280    CMP Latest Ref Rng & Units 02/08/2021 02/07/2021 02/06/2021  Glucose 70 - 99 mg/dL 168(H) 118(H) 132(H)  BUN 6 - 20 mg/dL '10 11 12  '$ Creatinine 0.61 - 1.24 mg/dL 1.14 1.22 1.12  Sodium 135 - 145 mmol/L 138 140 137  Potassium 3.5 - 5.1 mmol/L 3.9 4.1 3.5  Chloride 98 - 111 mmol/L 106 110 105  CO2 22 -  32 mmol/L '23 23 24  '$ Calcium 8.9 - 10.3 mg/dL 8.7(L) 8.8(L) 8.5(L)  Total Protein 6.5 - 8.1 g/dL - - -  Total Bilirubin 0.3 - 1.2 mg/dL - - -  Alkaline Phos 38 - 126 U/L - - -  AST 15 - 41 U/L - - -  ALT 0 - 44 U/L - - -    CT ANGIO LOW EXTREM RIGHT W &/OR WO CONTRAST  Result Date: 02/05/2021 CLINICAL DATA:  Arterial embolism lower extremity EXAM: CT ANGIOGRAPHY OF THE LEFT LOWEREXTREMITY TECHNIQUE: Multidetector CT imaging of the left lower extremity was performed using the standard protocol during bolus administration of intravenous contrast. Multiplanar CT image reconstructions and MIPs were obtained to evaluate the vascular anatomy. CONTRAST:  190m OMNIPAQUE IOHEXOL 350 MG/ML SOLN COMPARISON:  None. FINDINGS: Stent or stent graft noted in the distal aorta continuing into the left common iliac artery. Postoperative changes in the left groin with fluid collection adjacent to the proximal left lower extremity graft. There is occlusion of the native superficial femoral artery in the proximal thigh. Profunda femoris branches remain patent. Graft noted from the left common femoral artery which ties in in the left calf to the posterior tibial artery, which is the dominant runoff. Graft is widely patent. Posterior tibial artery below the graft tie in is patent to the ankle. Fluid collection adjacent to the proximal graft in the left groin. Small fluid collection noted in the left calf at the tight and with the posterior tibial artery. There is edema throughout the subcutaneous soft tissues in the left lower extremity. Review of the MIP images confirms the above findings. IMPRESSION: Patent inflow to the left lower extremity. Patent left fem-tib bypass graft which ties into the posterior tibial artery in the mid left calf. Posterior tibial artery is patent to the ankle. Electronically Signed   By: KRolm BaptiseM.D.   On: 02/05/2021 02:44   VAS [place] WITH/WO TBI  Result Date: 02/05/2021  LOWER EXTREMITY  DOPPLER STUDY Patient Name:  Jose Hansen Date of Exam:   02/05/2021 Medical Rec #: [id number]  Accession #:    [id number] Date of Birth: 12-26-1970           Patient Gender: M Patient Age:   88 years Exam Location:  [facility] Procedure:      VAS [place] ABI WITH/WO TBI Referring Phys: JASON MESNER --------------------------------------------------------------------------------  Indications: Rest pain, and right AKA.  Vascular Interventions: BYPASS GRAFT FEMORAL-PT ARTERY LEFT USING 6 MM X 80 CM                         GORE PROPATEN VASCULAR GRAFT REMOVABLE RING 01/20/21. Comparison Study: 01/16/21 Performing Technologist: Archie Patten RVS  Examination Guidelines: A complete evaluation includes at minimum, Doppler waveform signals and systolic blood pressure reading at the level of bilateral brachial, anterior tibial, and posterior tibial arteries, when vessel segments are accessible. Bilateral testing is considered an integral part of a complete examination. Photoelectric Plethysmograph (PPG) waveforms and toe systolic pressure readings are included as required and additional duplex testing as needed. Limited examinations for reoccurring indications may be performed as noted.  ABI Findings: +--------+------------------+-----+---------+--------+ Right   Rt Pressure (mmHg)IndexWaveform Comment  +--------+------------------+-----+---------+--------+ [id number]                    triphasic         +--------+------------------+-----+---------+--------+ +---------+------------------+-----+---------+-------+ Left     Lt Pressure (mmHg)IndexWaveform Comment +---------+------------------+-----+---------+-------+ Brachial 144                    triphasic        +---------+------------------+-----+---------+-------+ PTA      163               1.05 triphasic        +---------+------------------+-----+---------+-------+ DP       151               0.97 triphasic         +---------+------------------+-----+---------+-------+ Great Toe97                0.63                  +---------+------------------+-----+---------+-------+  Summary: Right: AKA. Left: Resting left ankle-brachial index is within normal range. No evidence of significant left lower extremity arterial disease.  *See table(s) above for measurements and observations.  Electronically signed by Jamelle Haring on 02/05/2021 at 4:36:01 PM.    Final    VAS [place] LOWER EXTREMITY VENOUS (DVT) (ONLY [facility] & [facility])  Result Date: 02/05/2021  Lower Venous DVT Study Patient Name:  Jose Hansen  Date of Exam:   02/05/2021 Medical Rec #: [id number]          Accession #:    [id number] Date of Birth: Feb 07, 1971           Patient Gender: M Patient Age:   74 years Exam Location:  [facility] Procedure:      VAS [place] LOWER EXTREMITY VENOUS (DVT) Referring Phys: Corene Cornea MESNER --------------------------------------------------------------------------------  Indications: Swelling, and Edema.  Limitations: Unable to compress due to patient pain tolerance. Comparison Study: no prior Performing Technologist: Archie Patten RVS  Examination Guidelines: A complete evaluation includes B-mode imaging, spectral Doppler, color Doppler, and power Doppler as needed of all accessible portions of each vessel. Bilateral testing is considered an integral part of a complete examination. Limited examinations for reoccurring indications may be performed as noted. The reflux portion of the exam is performed with the patient in reverse Trendelenburg.  +-----+---------------+---------+-----------+----------+--------------+  RIGHTCompressibilityPhasicitySpontaneityPropertiesThrombus Aging +-----+---------------+---------+-----------+----------+--------------+ CFV                 Yes      Yes                                 +-----+---------------+---------+-----------+----------+--------------+    +---------+---------------+---------+-----------+----------+-------------------+ LEFT     CompressibilityPhasicitySpontaneityPropertiesThrombus Aging      +---------+---------------+---------+-----------+----------+-------------------+ CFV                     Yes      Yes                                      +---------+---------------+---------+-----------+----------+-------------------+ SFJ                                                   Not well visualized +---------+---------------+---------+-----------+----------+-------------------+ FV Prox                 Yes      Yes                                      +---------+---------------+---------+-----------+----------+-------------------+ FV Mid                  Yes      Yes                                      +---------+---------------+---------+-----------+----------+-------------------+ FV Distal               Yes      Yes                                      +---------+---------------+---------+-----------+----------+-------------------+ PFV                     Yes      Yes                                      +---------+---------------+---------+-----------+----------+-------------------+ POP      Full           Yes      Yes                                      +---------+---------------+---------+-----------+----------+-------------------+ PTV      Full                                                             +---------+---------------+---------+-----------+----------+-------------------+ PERO     Full                                                             +---------+---------------+---------+-----------+----------+-------------------+  Summary: RIGHT: - No evidence of common femoral vein obstruction.  LEFT: - There is no evidence of deep vein thrombosis in the lower extremity. However, portions of this examination were limited- see technologist comments above.  - Fluid noted  in the groin area- confirmed on CTA  *See table(s) above for measurements and observations. Electronically signed by Jamelle Haring on 02/05/2021 at 4:35:20 PM.    Final      Signed: Farrel Gordon, D.O.  Internal Medicine Resident, PGY-1 [facility]  Pager: [phone] 4:55 PM, 02/12/2021

## 2021-02-12 NOTE — TOC Transition Note (Signed)
Transition of Care (TOC) - CM/SW Discharge Note Marvetta Gibbons RN, BSN Transitions of Care Unit 4E- RN Case Manager See Treatment Team for direct phone #    Patient Details  Name: Jose Hansen MRN: [id number] Date of Birth: 1970/06/11  Transition of Care [facility]) CM/SW Contact:  Dawayne Patricia, RN Phone Number: 02/12/2021, 2:34 PM   Clinical Narrative:    Pt stable for transition home, was stating that he could not get into his brother's home and brother out of town, bedside RN has spoken with brother and brother has made arrangements for the home to be open after 3pm today so that pt can get into the home for transition home. Will plan to arrange transport home for pt with [facility] transport wheelchair service later this afternoon after 3pm.   Pt has w/c at  bedside, also has RW and shower chair at home. Was active with [facility] for HHRN/PT prior to admit and orders have been placed to resume services. Notification sent to [facility] with [facility] for resumption of [place]. Pt will also go home with prevena wound VAC that bedside RN will make sure is in place.   Pt has expressed concern about living with his brother, he also expressed desire for alternate living situation. He does not meet criteria for SNF placement, and has been provided with housing resources on his last admit that he needs to f/u on for alternate housing options.    Final next level of care: [place] Barriers to Discharge: Barriers Resolved   Patient Goals and CMS Choice Patient states their goals for this hospitalization and ongoing recovery are:: wants SNF/ alternative housing CMS Medicare.gov Compare Post Acute Care list provided to:: Patient Choice offered to / list presented to : Patient  Discharge Placement                 Home w/ [facility]      Discharge Plan and Services   Discharge Planning Services: CM Consult Post Acute Care Choice: Home Health, Resumption of Svcs/PTA Provider          DME  Arranged: N/A DME Agency: NA       HH Arranged: PT, RN [place] Agency: [place] Date [facility] Agency Contacted: 02/12/21 Time [place]: [id number] Representative spoke with at [place]: [place] ([place]) Interventions     Readmission Risk Interventions Readmission Risk Prevention Plan 02/12/2021 02/12/2021 01/26/2021  Transportation Screening Complete Complete Complete  PCP or Specialist Appt within 5-7 Days - Complete Complete  Home Care Screening - Complete Complete  Medication Review (RN CM) - Complete Complete  Some recent data might be hidden

## 2021-02-12 NOTE — Progress Notes (Addendum)
Progress Note    02/12/2021 7:07 AM 3 Days Post-Op  Subjective:  continues to improve   Vitals:   02/11/21 2325 02/12/21 0345  BP: 107/65 107/74  Pulse: 76 67  Resp: 20 10  Temp: 98.5 F (36.9 C) 98.8 F (37.1 C)  SpO2: 97% 97%    Physical Exam: General appearance: Awake, alert in no apparent distress Cardiac: Heart rate and rhythm are regular Respirations: Nonlabored Incisions: Left lower leg incision with Prevena in place. Clear SS drainage Extremities: Left foot is warm with intact sensation and motor function.  Pulse/Doppler exam:  Monophasic left dorsalis pedis, biphasic posterior tibial and peroneal artery Doppler signals    CBC    Component Value Date/Time   WBC 13.8 (H) 02/10/2021 0817   RBC 4.11 (L) 02/10/2021 0817   HGB 11.2 (L) 02/10/2021 0817   HGB 15.4 12/03/2020 1202   HCT 34.6 (L) 02/10/2021 0817   HCT 47.0 12/03/2020 1202   PLT 312 02/10/2021 0817   PLT 288 12/03/2020 1202   MCV 84.2 02/10/2021 0817   MCV 81 12/03/2020 1202   MCH 27.3 02/10/2021 0817   MCHC 32.4 02/10/2021 0817   RDW 13.2 02/10/2021 0817   RDW 13.5 12/03/2020 1202   LYMPHSABS 1.0 02/10/2021 0817   LYMPHSABS 3.0 12/03/2020 1202   MONOABS 1.3 (H) 02/10/2021 0817   EOSABS 0.0 02/10/2021 0817   EOSABS 0.4 12/03/2020 1202   BASOSABS 0.0 02/10/2021 0817   BASOSABS 0.1 12/03/2020 1202    BMET    Component Value Date/Time   NA 138 02/08/2021 0923   NA 145 (H) 12/03/2020 1202   K 3.9 02/08/2021 0923   CL 106 02/08/2021 0923   CO2 23 02/08/2021 0923   GLUCOSE 168 (H) 02/08/2021 0923   BUN 10 02/08/2021 0923   BUN 11 12/03/2020 1202   CREATININE 1.14 02/08/2021 0923   CALCIUM 8.7 (L) 02/08/2021 0923   GFRNONAA >60 02/08/2021 0923     Intake/Output Summary (Last 24 hours) at 02/12/2021 0707 Last data filed at 02/11/2021 1813 Gross per 24 hour  Intake 240 ml  Output 775 ml  Net -535 ml    HOSPITAL MEDICATIONS Scheduled Meds:  acetaminophen  650 mg Oral Q6H    apixaban  5 mg Oral BID   aspirin  81 mg Oral Daily   atorvastatin  80 mg Oral Daily   doxycycline  100 mg Oral Q12H   DULoxetine  60 mg Oral Daily   famotidine  20 mg Oral Daily   oxyCODONE  15 mg Oral Q6H   polyethylene glycol  17 g Oral Daily   pregabalin  100 mg Oral TID   QUEtiapine  150 mg Oral QHS   senna  1 tablet Oral Daily   Continuous Infusions: PRN Meds:.HYDROmorphone (DILAUDID) injection  Assessment and Plan: POD # 3 Washout and closure of left below-knee bypass (bypass done 01/20/2021) wound dehiscence.  Rare MRSA identified.  Remains afebrile. On statin and aspirin. On apixaban for DVT. OK for discharge from vascular surgery perspective. Continue doxycycline for another 2 weeks. Follow-up in office in one week Application of Prevena wound VAC. Attach Prevena battery pack at time of discharge. Appears he is getting oxycodone '15mg'$  IR from PCP (on PTA med list) so will defer post-op rx.  Risa Grill, PA-C [facility] [phone] 02/12/2021  7:07 AM   I have independently interviewed and examined patient and agree with PA assessment and plan above.  I have added IV Robaxin to  his medications for persistent muscle spasms.  From a wound standpoint he appears to have thin serosanguineous drainage without any evidence of infection and his wound VAC.  We will continue this for 1 week and have him follow-up in the office.  His leg is otherwise warm and well-perfused.  Matix Henshaw C. Donzetta Matters, MD [facility] [place] Office: [phone] Pager: [phone]

## 2021-02-14 LAB — AEROBIC/ANAEROBIC CULTURE W GRAM STAIN (SURGICAL/DEEP WOUND)
Culture: NO GROWTH
Gram Stain: NONE SEEN

## 2021-02-15 ENCOUNTER — Telehealth: Payer: Self-pay | Admitting: *Deleted

## 2021-02-15 NOTE — Telephone Encounter (Signed)
[facility] called to report prevena wound vac no longer working because it is full. Martin Majestic has been on for 6 days. Instructed nurse to remove wound vac. Incision is closed and looks good per nurse. Advised nurse to put on a dry dressing and a have the pt call us if he is having to change the dressing excessively due to drainage. Nurse verbalized understanding.

## 2021-02-16 ENCOUNTER — Telehealth: Payer: Self-pay | Admitting: Internal Medicine

## 2021-02-16 NOTE — Telephone Encounter (Signed)
PT Jose Hansen [phone] calling to verify  weight or movement restrictions he is not to be doing.  Please call back as soon as possible.

## 2021-02-17 ENCOUNTER — Other Ambulatory Visit (HOSPITAL_COMMUNITY): Payer: Self-pay

## 2021-02-17 ENCOUNTER — Ambulatory Visit (INDEPENDENT_AMBULATORY_CARE_PROVIDER_SITE_OTHER): Payer: Medicare Other | Admitting: Physician Assistant

## 2021-02-17 ENCOUNTER — Encounter: Payer: Self-pay | Admitting: Physician Assistant

## 2021-02-17 ENCOUNTER — Other Ambulatory Visit: Payer: Self-pay

## 2021-02-17 VITALS — BP 124/77 | HR 77 | Temp 97.9°F | Resp 20 | Ht 71.0 in

## 2021-02-17 DIAGNOSIS — T8131XS Disruption of external operation (surgical) wound, not elsewhere classified, sequela: Secondary | ICD-10-CM

## 2021-02-17 DIAGNOSIS — I739 Peripheral vascular disease, unspecified: Secondary | ICD-10-CM

## 2021-02-17 NOTE — Progress Notes (Signed)
POST OPERATIVE OFFICE NOTE    CC:  F/u for surgery  HPI:  This is a 50 y.o. male who is s/p left common femoral to posterior tibial bypass with PTFE on 01/20/21 by Dr. Donzetta Matters. He had to subsequently undergo washout and closer of left below knee bypass wound dehiscence with application of Prevena wound VAC on 02/09/21. He was discharged on 9/16 with the [facility] and was advised to continue doxycycline for 2 weeks. His home health nurse on 9/19 called to say that the Prevena stopped working due to being too full. She was instructed to remove the prevena. He reports today that since then the incision has been putting out bloody yellowish drainage. He has had to change dressings 2 x per day. Still having tenderness and pain in left leg. Also reports diminished sensation in the left leg. Still having swelling in left foot and ankle. He does not elevate his leg. He is compliant with taking his Doxycycline.   Allergies  Allergen Reactions   Contrast Media [Iodinated Diagnostic Agents] Nausea Only    NOT AN ALLERGY. ONLY NAUSEA. 12/11/20  Newly reported by [facility] on 12/04/20.    Current Outpatient Medications  Medication Sig Dispense Refill   acetaminophen (TYLENOL) 325 MG tablet Take 2 tablets (650 mg total) by mouth every 6 (six) hours as needed for moderate pain or mild pain.     apixaban (ELIQUIS) 5 MG TABS tablet Take 1 tablet (5 mg total) by mouth 2 (two) times daily. 60 tablet 3   aspirin EC 81 MG tablet Take 81 mg by mouth daily. Swallow whole.     atorvastatin (LIPITOR) 80 MG tablet Take 1 tablet (80 mg total) by mouth daily. 90 tablet 1   doxycycline (VIBRA-TABS) 100 MG tablet Take 1 tablet (100 mg total) by mouth every 12 (twelve) hours for 23 doses. 23 tablet 0   DULoxetine (CYMBALTA) 60 MG capsule Take 1 capsule (60 mg total) by mouth daily. 30 capsule 2   famotidine (PEPCID) 20 MG tablet Take 1 tablet (20 mg total) by mouth daily. 30 tablet 0   methocarbamol (ROBAXIN) 500 MG  tablet Take 2 tablets (1,000 mg total) by mouth 3 (three) times daily. 21 tablet 0   oxyCODONE (ROXICODONE) 15 MG immediate release tablet Take 1 tablet (15 mg total) by mouth every 6 (six) hours for 5 days. 30 tablet 0   pregabalin (LYRICA) 100 MG capsule Take 1 capsule (100 mg total) by mouth 3 (three) times daily. 90 capsule 0   QUEtiapine (SEROQUEL) 50 MG tablet Take 3 tablets (150 mg total) by mouth at bedtime. 90 tablet 3   No current facility-administered medications for this visit.     ROS:  See HPI  Physical Exam:  Vitals:   02/17/21 1142  BP: 124/77  Pulse: 77  Resp: 20  Temp: 97.9 F (36.6 C)  TempSrc: Temporal  SpO2: 95%  Height: '5\' 11"'$  (1.803 m)    Incision:  left medial leg incision is well appearing, very fragile skin due to significant scar tissue. Staples removed. Did not remove nylon mattress sutures. There is some serosanguinous drainage from proximal aspect of incision where sutures are present. No appreciable fluid collection.No visible graft. Does not appear infected. 4x4s, kerlix and ace applied    Extremities:  LLE well perfused and warm. No palpable distal pulses Neuro: alert and oriented   Assessment/Plan:  This is a 50 y.o. male who is s/p left common femoral to posterior tibial bypass  with PTFE on 01/20/21 by Dr. Donzetta Matters. He had to subsequently undergo washout and closer of left below knee bypass wound dehiscence with application of Prevena wound VAC on 02/09/21. Left medial incision is healing. The proximal aspect still has small opening and drainage is present. Mattress sutures left as these are keeping skin over PTFE graft covered. Does not appear to be infected at this time. Discussed importance of completing course of antibiotics. He will continue to have Home health assist with cleaning and wound care. Advised him to call if increased drainage, redness, fever or chills. I will have him return in 1 week for another incision check and removal of sutures. He  will need LLE bypass graft duplex and ABIs in 4 weeks   Karoline Caldwell, PA-C [facility] [phone]  Clinic MD:  [facility]/ Scot Dock

## 2021-02-18 ENCOUNTER — Encounter (HOSPITAL_COMMUNITY): Payer: Self-pay | Admitting: Emergency Medicine

## 2021-02-18 ENCOUNTER — Telehealth: Payer: Self-pay | Admitting: Internal Medicine

## 2021-02-18 ENCOUNTER — Ambulatory Visit: Payer: Medicare Other | Admitting: Behavioral Health

## 2021-02-18 ENCOUNTER — Other Ambulatory Visit: Payer: Self-pay

## 2021-02-18 ENCOUNTER — Telehealth: Payer: Self-pay

## 2021-02-18 ENCOUNTER — Emergency Department (HOSPITAL_COMMUNITY)
Admission: EM | Admit: 2021-02-18 | Discharge: 2021-02-18 | Disposition: A | Discharge status: Home / Self Care | Payer: Medicare Other | Attending: Emergency Medicine | Admitting: Emergency Medicine

## 2021-02-18 ENCOUNTER — Other Ambulatory Visit: Payer: Self-pay | Admitting: Physician Assistant

## 2021-02-18 ENCOUNTER — Telehealth: Payer: Self-pay | Admitting: *Deleted

## 2021-02-18 DIAGNOSIS — L089 Local infection of the skin and subcutaneous tissue, unspecified: Secondary | ICD-10-CM | POA: Diagnosis not present

## 2021-02-18 DIAGNOSIS — Z5321 Procedure and treatment not carried out due to patient leaving prior to being seen by health care provider: Secondary | ICD-10-CM | POA: Diagnosis not present

## 2021-02-18 LAB — COMPREHENSIVE METABOLIC PANEL
ALT: 25 U/L (ref 0–44)
AST: 20 U/L (ref 15–41)
Albumin: 3.5 g/dL (ref 3.5–5.0)
Alkaline Phosphatase: 98 U/L (ref 38–126)
Anion gap: 8 (ref 5–15)
BUN: 11 mg/dL (ref 6–20)
CO2: 25 mmol/L (ref 22–32)
Calcium: 9 mg/dL (ref 8.9–10.3)
Chloride: 105 mmol/L (ref 98–111)
Creatinine, Ser: 1.3 mg/dL — ABNORMAL HIGH (ref 0.61–1.24)
GFR, Estimated: >60 mL/min (ref >60)
Glucose, Bld: 103 mg/dL — ABNORMAL HIGH (ref 70–99)
Potassium: 3.6 mmol/L (ref 3.5–5.1)
Sodium: 138 mmol/L (ref 135–145)
Total Bilirubin: 0.6 mg/dL (ref 0.3–1.2)
Total Protein: 6.4 g/dL — ABNORMAL LOW (ref 6.5–8.1)

## 2021-02-18 LAB — CBC WITH DIFFERENTIAL/PLATELET
Abs Immature Granulocytes: 0.04 10*3/uL (ref 0.00–0.07)
Basophils Absolute: 0.1 10*3/uL (ref 0.0–0.1)
Basophils Relative: 1 %
Eosinophils Absolute: 0.3 10*3/uL (ref 0.0–0.5)
Eosinophils Relative: 4 %
HCT: 35.4 % — ABNORMAL LOW (ref 39.0–52.0)
Hemoglobin: 11.2 g/dL — ABNORMAL LOW (ref 13.0–17.0)
Immature Granulocytes: 1 %
Lymphocytes Relative: 29 %
Lymphs Abs: 2.3 10*3/uL (ref 0.7–4.0)
MCH: 26.7 pg (ref 26.0–34.0)
MCHC: 31.6 g/dL (ref 30.0–36.0)
MCV: 84.5 fL (ref 80.0–100.0)
Monocytes Absolute: 0.8 10*3/uL (ref 0.1–1.0)
Monocytes Relative: 11 %
Neutro Abs: 4.3 10*3/uL (ref 1.7–7.7)
Neutrophils Relative %: 54 %
Platelets: 236 10*3/uL (ref 150–400)
RBC: 4.19 MIL/uL — ABNORMAL LOW (ref 4.22–5.81)
RDW: 13.2 % (ref 11.5–15.5)
WBC: 7.8 10*3/uL (ref 4.0–10.5)
nRBC: 0 % (ref 0.0–0.2)

## 2021-02-18 MED ORDER — TRAMADOL HCL 50 MG PO TABS: 50.0000 mg | ORAL_TABLET | Freq: Four times a day (QID) | ORAL | 0 refills | Status: DC | PRN

## 2021-02-18 MED ORDER — ACETAMINOPHEN 325 MG PO TABS
650.0000 mg | ORAL_TABLET | Freq: Once | ORAL | Status: AC
  Administered 2021-02-18: 650 mg via ORAL
  Filled 2021-02-18: qty 2

## 2021-02-18 NOTE — ED Notes (Signed)
Pt states he is due to be back here in the morning so he is just going home, LWBS

## 2021-02-18 NOTE — Telephone Encounter (Signed)
Call from [place]  with [facility] - stated pt was discharged on 9/16 and she completed her eval. Requesting verbal order for "OT once a week x 5 weeks". VO given - if not appropriate, let me know.'Thanks

## 2021-02-18 NOTE — Telephone Encounter (Signed)
Rec'd call from PT with Jose Hansen Jose Hansen).  Pt had 1 visit only.  Pt had 10 out of 10 Pain Prior to medications.  Pt's BP Seated was 160/90 Pt's Standing BP  Was148/88 Pt reports Intermittent  Dizziness  Per PT  Jose Hansen) Pt would Benefit from a medical Social Worker from [employer] if a VO is placed for this patient.  Pt is interested in getting a Copy.  Per PT Jose Hansen) Concerns are that this patient is possibly going to have his other leg amputated which would change his Plan of Care.  Please call back to discuss.

## 2021-02-18 NOTE — ED Triage Notes (Signed)
Patient reports infected surgical incision at left lower leg with drainage onset last week , denies fever or chills .

## 2021-02-18 NOTE — Telephone Encounter (Signed)
Left detailed message of info below on the secure VM of Herbert Deaner, PT with [facility].

## 2021-02-18 NOTE — ED Provider Notes (Signed)
Emergency Medicine Provider Triage Evaluation Note  Macrae BERTHEL KAPAUN , a 50 y.o. male  was evaluated in triage.  Pt complains of left leg pain.  Patient states that 1 week ago he had a surgical procedure to his left calf.  Was placed on Keflex for same.  Most of the incision has healed, however there is 1 remaining area that is draining purulent fluid.  Patient was evaluated by at home nurse who told him to come here for evaluation.  Review of Systems  Positive: Leg pain Negative: Fevers, chills, paresthesias  Physical Exam  BP 134/82 (BP Location: Left Arm)   Pulse 83   Temp 98.5 F (36.9 C) (Oral)   Resp 18   Ht '5\' 11"'$  (1.803 m)   Wt 110 kg   SpO2 100%   BMI 33.82 kg/m  Gen:   Awake, no distress   Resp:  Normal effort  MSK:   Moves extremities without difficulty    Medical Decision Making  Medically screening exam initiated at 9:14 PM.  Appropriate orders placed.  Damarie JACQUEZ STOLARZ was informed that the remainder of the evaluation will be completed by another provider, this initial triage assessment does not replace that evaluation, and the importance of remaining in the ED until their evaluation is complete.     Nestor Lewandowsky 02/18/21 2118    Lennice Sites, DO 02/18/21 2312

## 2021-02-18 NOTE — Telephone Encounter (Addendum)
Patient is s/p bypass fem pt bypass with graft on 8/24 - wound washout on 9/13. Was seen in office on 9/21 and instructed to call back if s/s of infection develop. Langley Gauss from [facility] calls today and reports the patient is having severe pain and there is copious serous drainage that has increased since O/V yesterday. No fever or chills. Discussed with PA and will bring patient in tomorrow for a wound check. Sent in tramadol RX. Instructed HH RN to do wet to dry dressing changes.

## 2021-02-19 ENCOUNTER — Ambulatory Visit (INDEPENDENT_AMBULATORY_CARE_PROVIDER_SITE_OTHER): Payer: Medicare Other | Admitting: Physician Assistant

## 2021-02-19 ENCOUNTER — Other Ambulatory Visit (HOSPITAL_COMMUNITY): Payer: Self-pay

## 2021-02-19 ENCOUNTER — Encounter: Payer: Self-pay | Admitting: Physician Assistant

## 2021-02-19 VITALS — BP 124/71 | HR 70 | Temp 97.9°F | Resp 18 | Ht 71.0 in | Wt 220.0 lb

## 2021-02-19 DIAGNOSIS — T8131XS Disruption of external operation (surgical) wound, not elsewhere classified, sequela: Secondary | ICD-10-CM

## 2021-02-19 DIAGNOSIS — Z01810 Encounter for preprocedural cardiovascular examination: Secondary | ICD-10-CM

## 2021-02-19 MED ORDER — CIPROFLOXACIN HCL 500 MG PO TABS
500.0000 mg | ORAL_TABLET | Freq: Two times a day (BID) | ORAL | 0 refills | Status: DC
  Filled 2021-02-19: qty 28, 14d supply, fill #0

## 2021-02-19 MED ORDER — OXYCODONE-ACETAMINOPHEN 10-325 MG PO TABS
1.0000 | ORAL_TABLET | Freq: Four times a day (QID) | ORAL | 0 refills | Status: DC | PRN
  Filled 2021-02-19: qty 30, 8d supply, fill #0

## 2021-02-19 NOTE — Progress Notes (Signed)
POST OPERATIVE OFFICE NOTE    CC:  F/u for surgery  HPI:  This is a 50 y.o. male who is s/p left fem-PT bypass PTFE on 01/20/21,  followed by Dehiscence of left below-knee incision with return to the OR for I & D of the distal bypass on 02/09/21 by Dr. Donzetta Matters..    Pt returns today for follow up.  Pt states He had increased drainage at the top of the incision and went to the ED.  He was not seen.  He has been on Cipro since discharge from the hospital 02/12/21.   He denise fever and chills.    Allergies  Allergen Reactions   Contrast Media [Iodinated Diagnostic Agents] Nausea Only    NOT AN ALLERGY. ONLY NAUSEA. 12/11/20  Newly reported by [facility] on 12/04/20.    Current Outpatient Medications  Medication Sig Dispense Refill   acetaminophen (TYLENOL) 325 MG tablet Take 2 tablets (650 mg total) by mouth every 6 (six) hours as needed for moderate pain or mild pain.     apixaban (ELIQUIS) 5 MG TABS tablet Take 1 tablet (5 mg total) by mouth 2 (two) times daily. 60 tablet 3   aspirin EC 81 MG tablet Take 81 mg by mouth daily. Swallow whole.     atorvastatin (LIPITOR) 80 MG tablet Take 1 tablet (80 mg total) by mouth daily. 90 tablet 1   doxycycline (VIBRA-TABS) 100 MG tablet Take 1 tablet (100 mg total) by mouth every 12 (twelve) hours for 23 doses. 23 tablet 0   DULoxetine (CYMBALTA) 60 MG capsule Take 1 capsule (60 mg total) by mouth daily. 30 capsule 2   famotidine (PEPCID) 20 MG tablet Take 1 tablet (20 mg total) by mouth daily. 30 tablet 0   methocarbamol (ROBAXIN) 500 MG tablet Take 2 tablets (1,000 mg total) by mouth 3 (three) times daily. 21 tablet 0   oxyCODONE (ROXICODONE) 15 MG immediate release tablet Take 1 tablet (15 mg total) by mouth every 6 (six) hours for 5 days. 30 tablet 0   pregabalin (LYRICA) 100 MG capsule Take 1 capsule (100 mg total) by mouth 3 (three) times daily. 90 capsule 0   QUEtiapine (SEROQUEL) 50 MG tablet Take 3 tablets (150 mg total) by mouth at  bedtime. 90 tablet 3   traMADol (ULTRAM) 50 MG tablet Take 1 tablet (50 mg total) by mouth every 6 (six) hours as needed. 20 tablet 0   No current facility-administered medications for this visit.     ROS:  See HPI Vitals:   02/19/21 0910  BP: 124/71  Pulse: 70  Resp: 18  Temp: 97.9 F (36.6 C)  SpO2: 98%    Physical Exam:    Incision:  Proximal distal bypass incision with clear fluid drainage  Extremities:  Doppler signals PT/DP/peroneal left LE intact, motor intact, decreased sensation baseline. 2 proximal sutures removed today Lungs non labored breathing    Assessment/Plan:  This is a 50 y.o. male who is s/p:This is a 50 y.o. male who is s/p left common femoral to posterior tibial bypass with PTFE on 01/20/21 by Dr. Donzetta Matters. He had to subsequently undergo washout and closer of left below knee bypass wound dehiscence with application of Prevena wound VAC on 02/09/21  I refilled oxycodone 10/325 1 q 6 # 30 decreasing it from 15 mg.  I also gave him 2 weeks more Doxycycline 500 mg BID.  He was given dressing supplies plain Iodaform gauze to pack in the wound, 4  x 4 Kerlex and ace.  He will f/u for wound check next Weds.   Encouraged elevation when at rest of the left LE.      Roxy Horseman PA-C [facility] [phone]   Clinic MD:  Virl Cagey

## 2021-02-21 ENCOUNTER — Other Ambulatory Visit: Payer: Self-pay

## 2021-02-21 ENCOUNTER — Emergency Department (HOSPITAL_COMMUNITY)
Admission: EM | Admit: 2021-02-21 | Discharge: 2021-02-21 | Disposition: A | Discharge status: Home / Self Care | Payer: Medicare Other | Attending: Emergency Medicine | Admitting: Emergency Medicine

## 2021-02-21 ENCOUNTER — Encounter (HOSPITAL_COMMUNITY): Payer: Self-pay | Admitting: *Deleted

## 2021-02-21 DIAGNOSIS — Z7982 Long term (current) use of aspirin: Secondary | ICD-10-CM | POA: Insufficient documentation

## 2021-02-21 DIAGNOSIS — Z87891 Personal history of nicotine dependence: Secondary | ICD-10-CM | POA: Diagnosis not present

## 2021-02-21 DIAGNOSIS — S81802A Unspecified open wound, left lower leg, initial encounter: Secondary | ICD-10-CM

## 2021-02-21 DIAGNOSIS — S81802D Unspecified open wound, left lower leg, subsequent encounter: Secondary | ICD-10-CM | POA: Insufficient documentation

## 2021-02-21 DIAGNOSIS — X58XXXD Exposure to other specified factors, subsequent encounter: Secondary | ICD-10-CM | POA: Diagnosis not present

## 2021-02-21 LAB — COMPREHENSIVE METABOLIC PANEL
ALT: 21 U/L (ref 0–44)
AST: 18 U/L (ref 15–41)
Albumin: 3.9 g/dL (ref 3.5–5.0)
Alkaline Phosphatase: 97 U/L (ref 38–126)
Anion gap: 10 (ref 5–15)
BUN: 11 mg/dL (ref 6–20)
CO2: 24 mmol/L (ref 22–32)
Calcium: 9.3 mg/dL (ref 8.9–10.3)
Chloride: 105 mmol/L (ref 98–111)
Creatinine, Ser: 1.37 mg/dL — ABNORMAL HIGH (ref 0.61–1.24)
GFR, Estimated: >60 mL/min (ref >60)
Glucose, Bld: 94 mg/dL (ref 70–99)
Potassium: 3.5 mmol/L (ref 3.5–5.1)
Sodium: 139 mmol/L (ref 135–145)
Total Bilirubin: 0.6 mg/dL (ref 0.3–1.2)
Total Protein: 7.3 g/dL (ref 6.5–8.1)

## 2021-02-21 LAB — CBC
HCT: 39.7 % (ref 39.0–52.0)
Hemoglobin: 12.5 g/dL — ABNORMAL LOW (ref 13.0–17.0)
MCH: 26.8 pg (ref 26.0–34.0)
MCHC: 31.5 g/dL (ref 30.0–36.0)
MCV: 85 fL (ref 80.0–100.0)
Platelets: 300 10*3/uL (ref 150–400)
RBC: 4.67 MIL/uL (ref 4.22–5.81)
RDW: 13.2 % (ref 11.5–15.5)
WBC: 7.5 10*3/uL (ref 4.0–10.5)
nRBC: 0 % (ref 0.0–0.2)

## 2021-02-21 MED ORDER — OXYCODONE-ACETAMINOPHEN 5-325 MG PO TABS
1.0000 | ORAL_TABLET | Freq: Once | ORAL | Status: AC
Start: 2021-02-21 — End: 2021-02-21
  Administered 2021-02-21: 1 via ORAL
  Filled 2021-02-21: qty 1

## 2021-02-21 NOTE — Discharge Instructions (Addendum)
Continue taking her antibiotics and pain medicine.  Please follow-up with your vascular surgeon at your appointment on Wednesday.

## 2021-02-21 NOTE — ED Provider Notes (Signed)
[facility] EMERGENCY DEPARTMENT Provider Note   CSN: [id number] Arrival date & time: 02/21/21  1658     History Chief Complaint  Patient presents with   Leg Pain    Jose Hansen is a 50 y.o. male.  HPI Patient is 50 year old male s/p left fem-PT bypass 01/20/2021 has been followed by vascular vein specialist in [facility] for the wound as a result of his surgery.  Did have I&D done 02/10/2019 by Dr. Donzetta Matters.  He has been followed by vascular surgery since that time with last seen 2 days ago he is currently on ciprofloxacin since he was discharged 9/16 from hospital.  He denies any fevers chills nausea vomiting lightheaded dizziness states that he is in the ER today because of continued pain in his left leg.  He states it is 10/10 achy constant he has been taking his Percocet but states that it is not mitigating his pain significantly  No other new symptoms today.  States that he has been able to get around as he normally does.  Is not able to ambulate because of right BKA and left leg pain.    Past Medical History:  Diagnosis Date   Atypical chest pain 01/13/2021   H/O blood clots    Left leg weakness 11/24/2020    Patient Active Problem List   Diagnosis Date Noted   Neuropathic pain    Leg swelling 02/05/2021   Post-op pain    Wound dehiscence    GERD (gastroesophageal reflux disease) 02/04/2021   Ischemia of extremity 01/14/2021   Hx of AKA (above knee amputation), right ([place]) 01/13/2021   Insomnia 01/08/2021   Right arm pain 12/03/2020   Depression 12/03/2020   Prediabetes 12/03/2020   DVT (deep venous thrombosis) ([place]) 11/24/2020   Essential hypertension 11/24/2020   PVD (peripheral vascular disease) ([place]) 11/24/2020   Hyperlipidemia 11/24/2020    Past Surgical History:  Procedure Laterality Date   ABDOMINAL AORTOGRAM W/LOWER EXTREMITY N/A 01/18/2021   Procedure: ABDOMINAL AORTOGRAM W/LOWER EXTREMITY;  Surgeon: Waynetta Sandy, MD;   Location: [place] [facility];  Service: Cardiovascular;  Laterality: N/A;   BELOW KNEE LEG AMPUTATION Right 2018   FEMORAL-TIBIAL BYPASS GRAFT Left 01/20/2021   Procedure: BYPASS GRAFT FEMORAL-PT ARTERY LEFT USING 6 MM X 80 CM GORE PROPATEN VASCULAR GRAFT REMOVABLE RING;  Surgeon: Waynetta Sandy, MD;  Location: [facility];  Service: Vascular;  Laterality: Left;   WOUND DEBRIDEMENT Left 02/09/2021   Procedure: LEFT LEG WASHOUT AND WOUND CLOSURE;  Surgeon: Waynetta Sandy, MD;  Location: [facility];  Service: Vascular;  Laterality: Left;       Family History  Problem Relation Age of Onset   CAD Neg Hx    Hypertension Neg Hx     Social History   Tobacco Use   Smoking status: Former    Types: Cigarettes   Smokeless tobacco: Never  Vaping Use   Vaping Use: Never used  Substance Use Topics   Alcohol use: Not Currently   Drug use: Not Currently    Home Medications Prior to Admission medications   Medication Sig Start Date End Date Taking? Authorizing Provider  acetaminophen (TYLENOL) 325 MG tablet Take 2 tablets (650 mg total) by mouth every 6 (six) hours as needed for moderate pain or mild pain. 01/26/21   [name], MD  apixaban (ELIQUIS) 5 MG TABS tablet Take 1 tablet (5 mg total) by mouth 2 (two) times daily. 01/08/21   Masters, Joellen Jersey, DO  aspirin EC 81 MG tablet Take 81 mg by mouth daily. Swallow whole.    [provider]  atorvastatin (LIPITOR) 80 MG tablet Take 1 tablet (80 mg total) by mouth daily. 01/08/21 04/08/21  Masters, Katie, DO  ciprofloxacin (CIPRO) 500 MG tablet Take 1 tablet (500 mg total) by mouth 2 (two) times daily. 02/19/21   Ulyses Amor, PA-C  doxycycline (VIBRA-TABS) 100 MG tablet Take 1 tablet (100 mg total) by mouth every 12 (twelve) hours for 23 doses. 02/12/21 02/24/21  Farrel Gordon, DO  DULoxetine (CYMBALTA) 60 MG capsule Take 1 capsule (60 mg total) by mouth daily. 02/12/21   Farrel Gordon, DO  famotidine (PEPCID) 20 MG tablet Take 1  tablet (20 mg total) by mouth daily. 01/27/21 02/26/21  [name], MD  methocarbamol (ROBAXIN) 500 MG tablet Take 2 tablets (1,000 mg total) by mouth 3 (three) times daily. 02/12/21   Farrel Gordon, DO  oxyCODONE-acetaminophen (PERCOCET) 10-325 MG tablet Take 1 tablet by mouth every 6 (six) hours as needed for pain. 02/19/21   Ulyses Amor, PA-C  pregabalin (LYRICA) 100 MG capsule Take 1 capsule (100 mg total) by mouth 3 (three) times daily. 02/12/21 03/14/21  Farrel Gordon, DO  QUEtiapine (SEROQUEL) 50 MG tablet Take 3 tablets (150 mg total) by mouth at bedtime. 02/12/21   Farrel Gordon, DO  traMADol (ULTRAM) 50 MG tablet Take 1 tablet (50 mg total) by mouth every 6 (six) hours as needed. 02/18/21   Baglia, Corrina, PA-C    Allergies    Contrast media [iodinated diagnostic agents]  Review of Systems   Review of Systems  Constitutional:  Negative for fever.  HENT:  Negative for congestion.   Respiratory:  Negative for shortness of breath.   Cardiovascular:  Negative for chest pain.  Gastrointestinal:  Negative for abdominal distention.  Musculoskeletal:        Leg pain  Neurological:  Negative for dizziness and headaches.   Physical Exam Updated Vital Signs BP (!) 155/94   Pulse 72   Temp 98.8 F (37.1 C) (Oral)   Resp 16   Ht '5\' 11"'$  (1.803 m)   Wt 99.8 kg   SpO2 100%   BMI 30.69 kg/m   Physical Exam Vitals and nursing note reviewed.  Constitutional:      General: He is not in acute distress.    Appearance: Normal appearance. He is not ill-appearing.  HENT:     Head: Normocephalic and atraumatic.     Mouth/Throat:     Mouth: Mucous membranes are moist.  Eyes:     General: No scleral icterus.       Right eye: No discharge.        Left eye: No discharge.     Conjunctiva/sclera: Conjunctivae normal.  Pulmonary:     Effort: Pulmonary effort is normal.     Breath sounds: No stridor.  Musculoskeletal:     Comments: Right lower extremity amputated at the knee.  Skin:     General: Skin is warm and dry.     Comments: Proximal distal bypass incision site with approximately 1.5 cm area that is gaping has a small amount of packing placed.  This was removed.  There is no purulence Area is not warm to touch.  Neurological:     Mental Status: He is alert and oriented to person, place, and time. Mental status is at baseline.     ED Results / Procedures / Treatments   Labs (all labs ordered are  listed, but only abnormal results are displayed) Labs Reviewed  COMPREHENSIVE METABOLIC PANEL - Abnormal; Notable for the following components:      Result Value   Creatinine, Ser 1.37 (*)    All other components within normal limits  CBC - Abnormal; Notable for the following components:   Hemoglobin 12.5 (*)    All other components within normal limits    EKG None  Radiology No results found.  Procedures Procedures   Medications Ordered in ED Medications  oxyCODONE-acetaminophen (PERCOCET/ROXICET) 5-325 MG per tablet 1 tablet (1 tablet Oral Given 02/21/21 1916)    ED Course  I have reviewed the triage vital signs and the nursing notes.  Pertinent labs & imaging results that were available during my care of the patient were reviewed by me and considered in my medical decision making (see chart for details).    MDM Rules/Calculators/A&P                           Patient with wound to left lower extremity see picture in PE section  This wound is from incision and drainage done by vascular surgery  He is currently on antibiotics states he is compliant with these.  No new pain no new swelling.  Good distal pulses.  Afebrile  CMP unremarkable CBC without leukocytosis or anemia.  We will discharge patient home at this time and encourage follow-up with vascular surgery.  On Wednesday as scheduled.  Final Clinical Impression(s) / ED Diagnoses Final diagnoses:  Wound of left lower extremity, initial encounter    Rx / DC Orders ED Discharge Orders      None        Tedd Sias, [place] 02/22/21 1259    Davonna Belling, MD 02/24/21 0008

## 2021-02-21 NOTE — ED Triage Notes (Signed)
The pt arrived by [facility] from home.  This is his third visit for leg wound of his lt lower leg.  He has had surgery on his lt lower leg sometime in august  today he was wrapping the wound and he reports that a wouond opened approx 2 inches wound is still bandaged

## 2021-02-22 ENCOUNTER — Other Ambulatory Visit: Payer: Self-pay | Admitting: Physician Assistant

## 2021-02-22 ENCOUNTER — Telehealth: Payer: Self-pay

## 2021-02-22 ENCOUNTER — Other Ambulatory Visit (HOSPITAL_COMMUNITY): Payer: Self-pay

## 2021-02-22 DIAGNOSIS — T8131XS Disruption of external operation (surgical) wound, not elsewhere classified, sequela: Secondary | ICD-10-CM

## 2021-02-22 MED ORDER — DOXYCYCLINE HYCLATE 100 MG PO TABS
100.0000 mg | ORAL_TABLET | Freq: Two times a day (BID) | ORAL | 0 refills | Status: DC
  Filled 2021-02-22: qty 23, 12d supply, fill #0

## 2021-02-22 NOTE — Telephone Encounter (Signed)
Langley Gauss from [facility] calls today to confirm patient's wound care orders and antibiotics. Wound care orders are pack with plain iodoform, gauze, kerlix and ace. Antibiotics were changed from cipro back to doxycycline. Patient to discontinue cipro and start doxycycline - called into [facility] OP pharmacy. Nurse verbalized understanding.

## 2021-02-24 ENCOUNTER — Other Ambulatory Visit: Payer: Self-pay

## 2021-02-24 ENCOUNTER — Ambulatory Visit (INDEPENDENT_AMBULATORY_CARE_PROVIDER_SITE_OTHER): Payer: Medicare Other | Admitting: Physician Assistant

## 2021-02-24 ENCOUNTER — Other Ambulatory Visit (HOSPITAL_COMMUNITY): Payer: Self-pay

## 2021-02-24 DIAGNOSIS — T8131XS Disruption of external operation (surgical) wound, not elsewhere classified, sequela: Secondary | ICD-10-CM

## 2021-02-24 DIAGNOSIS — Z9889 Other specified postprocedural states: Secondary | ICD-10-CM

## 2021-02-24 MED ORDER — OXYCODONE-ACETAMINOPHEN 10-325 MG PO TABS
1.0000 | ORAL_TABLET | Freq: Four times a day (QID) | ORAL | 0 refills | Status: DC | PRN
  Filled 2021-02-24 (×3): qty 20, 5d supply, fill #0

## 2021-02-24 NOTE — Progress Notes (Signed)
    Postoperative Visit   History of Present Illness   Jose Hansen is a 50 y.o. year old male who presents for postoperative follow-up.  He is status post left common femoral to posterior tibial artery bypass with PTFE on 01/20/2021 by Dr. Donzetta Matters.  Subsequently he developed drainage from his popliteal incision and underwent washout with application of incisional VAC on 02/09/2021.  Proximal aspect of incision continues to be slow to heal.  He reports excessive thin drainage from this area.  He has home health with Al[facility]ings who comes out to pack the wound 1 day a week and he is able to perform dressing changes himself.  Surgical history also significant for right AKA.  He denies fevers, chills, nausea/vomiting.  He continues to take doxycycline daily.     For VQI Use Only   PRE-ADM LIVING: Home  AMB STATUS: Ambulatory   Physical Examination   Vitals:   02/24/21 1049  BP: (!) 145/88  Pulse: 82  Resp: 20  Temp: 97.6 F (36.4 C)  TempSrc: Temporal  SpO2: 98%    LLE: Groin incision healed; proximal aspect of popliteal incision with 4 x 2 x 2 open area.  Unable to see graft material   Medical Decision Making   Jose Hansen is a 50 y.o. year old male who presents s/p left femoral to PT bypass with PTFE with subsequent washout of popliteal incision.  Left lower extremity well-perfused with 2+ palpable PT pulse Copious thin drainage from open area of popliteal incision.  No purulence.  No exposed graft material We will contact home health [facility] for placement of wound VAC to be changed 3 times weekly.  Continue p.o. antibiotics.  Patient is aware he is at high risk for amputation if graft is to be infected and he is unable to heal popliteal wound.  He will follow-up in 2 weeks for recheck I have also prescribed the patient narcotic pain medication for continued postoperative pain control   Dagoberto Ligas PA-C [facility] [place] Office:  [facility] MD: Donzetta Matters

## 2021-03-01 ENCOUNTER — Other Ambulatory Visit: Payer: Self-pay | Admitting: Internal Medicine

## 2021-03-01 ENCOUNTER — Other Ambulatory Visit (HOSPITAL_COMMUNITY): Payer: Self-pay

## 2021-03-02 ENCOUNTER — Other Ambulatory Visit (HOSPITAL_COMMUNITY): Payer: Self-pay

## 2021-03-02 MED ORDER — FAMOTIDINE 20 MG PO TABS
20.0000 mg | ORAL_TABLET | Freq: Every day | ORAL | 0 refills | Status: DC
  Filled 2021-03-02: qty 30, 30d supply, fill #0

## 2021-03-09 ENCOUNTER — Other Ambulatory Visit (HOSPITAL_COMMUNITY): Payer: Self-pay

## 2021-03-09 ENCOUNTER — Other Ambulatory Visit: Payer: Self-pay

## 2021-03-09 DIAGNOSIS — T8131XS Disruption of external operation (surgical) wound, not elsewhere classified, sequela: Secondary | ICD-10-CM

## 2021-03-10 ENCOUNTER — Ambulatory Visit: Payer: Medicare Other

## 2021-03-11 ENCOUNTER — Other Ambulatory Visit (HOSPITAL_COMMUNITY): Payer: Self-pay

## 2021-03-11 ENCOUNTER — Ambulatory Visit: Payer: Medicare Other | Admitting: Behavioral Health

## 2021-03-11 ENCOUNTER — Other Ambulatory Visit: Payer: Self-pay | Admitting: *Deleted

## 2021-03-11 DIAGNOSIS — F331 Major depressive disorder, recurrent, moderate: Secondary | ICD-10-CM

## 2021-03-11 DIAGNOSIS — F419 Anxiety disorder, unspecified: Secondary | ICD-10-CM

## 2021-03-11 NOTE — BH Specialist Note (Signed)
Integrated Behavioral Health via Telemedicine Visit  03/11/2021 Gamal VERE PELLETT IA:[id number]  Number of [place] visits: 2/6 Session Start time: 9:30am  Session End time: 10:00am Total time: 30  Referring Provider: Dr. [name], MD Patient/Family location: Pt is f:f w/Clinician today [facility] Provider location: [facility] Office All persons participating in visit: Pt & Clinician Types of Service: Individual psychotherapy  I connected with Taha Jacquiline Doe and/or Jaideep Z Inlow's  self  via  Telephone or Geologist, engineering  (Video is Tree surgeon) and verified that I am speaking with the correct person using two identifiers. Discussed confidentiality:  2nd visit  I discussed the limitations of telemedicine and the availability of in person appointments.  Discussed there is a possibility of technology failure and discussed alternative modes of communication if that failure occurs.  I discussed that engaging in this telemedicine visit, they consent to the provision of behavioral healthcare and the services will be billed under their insurance.  Patient and/or legal guardian expressed understanding and consented to Telemedicine visit:  2nd visit  Presenting Concerns: Patient and/or family reports the following symptoms/concerns: he is exp'g panic attacks when he is stuck in the home on his crutches & the rug catches the rubber attachments. Pt is in pain & has a chronic wound on his L shin that is difficult to heal. Pt is visited in the home by an RN 3d/wk Duration of problem: months; Severity of problem: moderate trending severe  Patient and/or Family's Strengths/Protective Factors: Social connections, Social and Emotional competence, Concrete supports in place (healthy food, safe environments, etc.), and Pt is determined to get himself back on track & confront the panic & anxiety he feels when falling.  Goals Addressed: Patient will:   Reduce symptoms of: anxiety, depression, and stress   Increase knowledge and/or ability of: coping skills, self-management skills, and stress reduction   Demonstrate ability to: Increase healthy adjustment to current life circumstances, Increase adequate support systems for patient/family, and Begin healthy grieving over loss  Progress towards Goals: Ongoing  Interventions: Interventions utilized:  Solution-Focused Strategies, Behavioral Activation, and Supportive Counseling Standardized Assessments completed:  screeners prn  Patient and/or Family Response: Pt is receptive to f:f visit today as he forgot it was a telehealth visit. Pt is grateful for the help w/his prescriptions via the Triage RN & resources provided by Clinician today.   Assessment: Patient currently experiencing elevated anx/dep due to recent episodes of falling & pain issues in his L lower shin where open wound is located & being treated.   Patient may benefit from tools & techniques he can use to address anxiety & fear related to falling. Pt describes these falls as causing a sensation of panic & then "falling out". He exp's blackness & realizes he is falling, but can often not hang onto smthg nearby to prevent the fall.  Plan: Follow up with behavioral health clinician on : 2-3 wks, f:f session for 60 min Behavioral recommendations: Use the skills provided today to assist you w/panic & anxiety, call the resources provided once you know your new address for furniture & other necessary items for your move Referral(s): [facility] (In Clinic) and [employer] Resources:  Therapist, sports for furniture & [employer]  I discussed the assessment and treatment plan with the patient and/or parent/guardian. They were provided an opportunity to ask questions and all were answered. They agreed with the plan and demonstrated an understanding of the instructions.   They were advised  to call back or seek an  in-person evaluation if the symptoms worsen or if the condition fails to improve as anticipated.  Donnetta Hutching, LMFT

## 2021-03-11 NOTE — Telephone Encounter (Signed)
Patient here for appt with [facility] requesting refills on oxycodone, Lyrica, and Robaxin. Explained he would need to call vascular surgeon for refill on oxycodone per Dr. Lorelee Cover note on 10/11. Will forward other requests.

## 2021-03-15 ENCOUNTER — Other Ambulatory Visit (HOSPITAL_COMMUNITY): Payer: Self-pay

## 2021-03-18 ENCOUNTER — Other Ambulatory Visit (HOSPITAL_COMMUNITY): Payer: Self-pay

## 2021-03-18 MED ORDER — PREGABALIN 100 MG PO CAPS
100.0000 mg | ORAL_CAPSULE | Freq: Three times a day (TID) | ORAL | 0 refills | Status: DC
  Filled 2021-03-18: qty 90, 30d supply, fill #0

## 2021-03-18 MED ORDER — METHOCARBAMOL 500 MG PO TABS
1000.0000 mg | ORAL_TABLET | Freq: Three times a day (TID) | ORAL | 0 refills | Status: DC
  Filled 2021-03-18: qty 21, 4d supply, fill #0

## 2021-03-18 NOTE — Telephone Encounter (Signed)
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 Please send his refills to the resident pool. I have added red team.

## 2021-03-18 NOTE — Telephone Encounter (Signed)
Refill Request  Patient states he has been waiting since 03/11/2021 for the 2 following medications:   Lyrica, and Robaxin  pregabalin (LYRICA) 100 MG capsule   methocarbamol (ROBAXIN) 500 MG tablet   [facility] (Ph: [phone])

## 2021-03-19 ENCOUNTER — Other Ambulatory Visit (HOSPITAL_COMMUNITY): Payer: Self-pay

## 2021-03-23 ENCOUNTER — Ambulatory Visit: Payer: Medicare Other | Admitting: Licensed Clinical Social Worker

## 2021-03-23 ENCOUNTER — Ambulatory Visit (HOSPITAL_COMMUNITY)
Admission: RE | Admit: 2021-03-23 | Discharge: 2021-03-23 | Disposition: A | Discharge status: Home / Self Care | Payer: Medicare Other | Source: Ambulatory Visit | Attending: Vascular Surgery | Admitting: Vascular Surgery

## 2021-03-23 ENCOUNTER — Ambulatory Visit (INDEPENDENT_AMBULATORY_CARE_PROVIDER_SITE_OTHER): Payer: Medicare Other | Admitting: Physician Assistant

## 2021-03-23 ENCOUNTER — Telehealth: Payer: Self-pay | Admitting: *Deleted

## 2021-03-23 ENCOUNTER — Other Ambulatory Visit (HOSPITAL_COMMUNITY): Payer: Self-pay

## 2021-03-23 ENCOUNTER — Other Ambulatory Visit: Payer: Self-pay

## 2021-03-23 DIAGNOSIS — T8131XS Disruption of external operation (surgical) wound, not elsewhere classified, sequela: Secondary | ICD-10-CM

## 2021-03-23 DIAGNOSIS — I739 Peripheral vascular disease, unspecified: Secondary | ICD-10-CM | POA: Insufficient documentation

## 2021-03-23 MED ORDER — OXYCODONE-ACETAMINOPHEN 10-325 MG PO TABS
1.0000 | ORAL_TABLET | Freq: Four times a day (QID) | ORAL | 0 refills | Status: DC | PRN
  Filled 2021-03-23: qty 20, 5d supply, fill #0

## 2021-03-23 NOTE — Progress Notes (Signed)
POST OPERATIVE OFFICE NOTE    CC:  F/u for surgery; 2 months post-op  HPI:  This is a 50 y.o. male who is s/p left common femoral to posterior tibial artery bypass with PTFE on 01/20/2021 by Dr. Donzetta Matters.  This was due to rest pain. He developed drainage from his popliteal incision and underwent washout with application of incisional VAC on 02/09/2021. He was doing dressing changes at home until 02/24/2021 and subsequently prescribed wound VAC to this incision during his last office visit. He has completed course of doxycycline.  Today, he relays left lower extremity pain, fullness in his proximal medial thigh, left foot pain and numbness.  He states he ambulates with crutches and over the last week is noticed increasingly limited ability to ambulate due to pain.  He has been taking his Eliquis as prescribed.  ABIs on 02/05/2021: 1.05/triphasic. He is s/p right AKA.  Current Outpatient Medications  Medication Sig Dispense Refill   acetaminophen (TYLENOL) 325 MG tablet Take 2 tablets (650 mg total) by mouth every 6 (six) hours as needed for moderate pain or mild pain.     apixaban (ELIQUIS) 5 MG TABS tablet Take 1 tablet (5 mg total) by mouth 2 (two) times daily. 60 tablet 3   aspirin EC 81 MG tablet Take 81 mg by mouth daily. Swallow whole.     atorvastatin (LIPITOR) 80 MG tablet Take 1 tablet (80 mg total) by mouth daily. 90 tablet 1   ciprofloxacin (CIPRO) 500 MG tablet Take 1 tablet (500 mg total) by mouth 2 (two) times daily. 28 tablet 0   doxycycline (VIBRA-TABS) 100 MG tablet Take 1 tablet (100 mg total) by mouth every 12 (twelve) hours for 23 doses 23 tablet 0   DULoxetine (CYMBALTA) 60 MG capsule Take 1 capsule (60 mg total) by mouth daily. 30 capsule 2   famotidine (PEPCID) 20 MG tablet Take 1 tablet (20 mg total) by mouth daily. 30 tablet 0   methocarbamol (ROBAXIN) 500 MG tablet Take 2 tablets (1,000 mg total) by mouth 3 (three) times daily. 21 tablet 0   oxyCODONE-acetaminophen (PERCOCET)  10-325 MG tablet Take 1 tablet by mouth every 6 (six) hours as needed for pain. 20 tablet 0   pregabalin (LYRICA) 100 MG capsule Take 1 capsule (100 mg total) by mouth 3 (three) times daily. 90 capsule 0   QUEtiapine (SEROQUEL) 50 MG tablet Take 3 tablets (150 mg total) by mouth at bedtime. 90 tablet 3   traMADol (ULTRAM) 50 MG tablet Take 1 tablet (50 mg total) by mouth every 6 (six) hours as needed. 20 tablet 0   No current facility-administered medications for this visit.     ROS:  See HPI  Vitals:   03/23/21 0825  BP: 124/75  Pulse: 75  Temp: 98.2 F (36.8 C)  SpO2: 97%     Physical Exam:  General appearance: Awake, alert in no apparent distress Cardiac: Heart rate and rhythm are regular Respirations: Nonlabored Incisions: Left groin incision well healed.  Left lower leg wound VAC is removed.  His wound now measures approximately 1 x 2 cm.  I probed it with a sterile cotton-tipped applicator and there is no tunneling.   Extremities: His left foot is cool to touch and he can only dorsiflex his toes.  I am unable to detect Doppler signals. Unable to palpate right femoral pulse   Left lower extremity femoropopliteal bypass graft duplex:  Assessment/Plan:  This is a 50 y.o. male who is s/p:  left common femoral to posterior tibial artery bypass with PTFE on 01/20/2021 by Dr. Donzetta Matters.  Subsequently he developed drainage from his popliteal incision and underwent washout with application of incisional VAC on 02/09/2021.   Duplex examination of his graft today reveals occluded bypass. Dr. Carlis Abbott discussed with dr. Donzetta Matters on phone. No options remain to salvage bypass or revascularize LLE. Will arrange appointment with Dr. Donzetta Matters to discuss AKA. Refill pain med.  Discontinue wound VAC.  Return to hydrogel dressings daily.  Risa Grill, PA-C [facility] [phone]  Clinic MD:  Dr. Carlis Abbott

## 2021-03-23 NOTE — Chronic Care Management (AMB) (Signed)
  Care Management   Outreach Note  03/23/2021 Name: Jose Hansen MRN: [id number] DOB: Jun 11, 1970  Referred by: [name], MD Reason for referral : Care Coordination (Initial outreach to schedule with BSW)   An unsuccessful telephone outreach was attempted today. The patient was referred to the case management team for assistance with care management and care coordination.   Follow Up Plan:  A HIPAA compliant phone message was left for the patient providing contact information and requesting a return call.  The care management team will reach out to the patient again over the next 7 days.  If patient returns call to provider office, please advise to call [place]* at [phone].*  [place] Management  Direct Dial: [phone]

## 2021-03-24 ENCOUNTER — Ambulatory Visit: Payer: Medicare Other | Admitting: Licensed Clinical Social Worker

## 2021-03-24 ENCOUNTER — Encounter: Payer: Self-pay | Admitting: Vascular Surgery

## 2021-03-24 ENCOUNTER — Ambulatory Visit (INDEPENDENT_AMBULATORY_CARE_PROVIDER_SITE_OTHER): Payer: Medicare Other | Admitting: Vascular Surgery

## 2021-03-24 VITALS — BP 131/75 | HR 76 | Temp 98.5°F | Resp 20 | Ht 71.0 in | Wt 220.0 lb

## 2021-03-24 DIAGNOSIS — I739 Peripheral vascular disease, unspecified: Secondary | ICD-10-CM

## 2021-03-24 NOTE — Chronic Care Management (AMB) (Signed)
  Care Management   Social Work Visit Note  03/24/2021 Name: Jose Hansen MRN: [id number] DOB: 1970/11/28  Jose Hansen is a 50 y.o. year old male who sees [name], MD for primary care. The care management team was consulted for assistance with care management and care coordination needs related to [facility] Resources    Patient was given the following information about care management and care coordination services today, agreed to services, and gave verbal consent: 1.care management/care coordination services include personalized support from designated clinical staff supervised by their physician, including individualized plan of care and coordination with other care providers 2. 24/7 contact phone numbers for assistance for urgent and routine care needs. 3. The patient may stop care management/care coordination services at any time by phone call to the office staff.  Engaged with patient by telephone for initial visit in response to provider referral for social work chronic care management and care coordination services.  Assessment: Review of patient history, allergies, and health status during evaluation of patient need for care management/care coordination services.    Interventions:  Patient interviewed and appropriate assessments performed Collaborated with clinical team regarding patient needs  SW completed SDOH screening and determined patient needed assistance with food. Patient advised he receives FNS benefits, but oftens runs out of food. Patient advised he has family support that assist with food. Patient advised his email is [email or web address]. SW emailed patient food resources.  Patient requested outreach by [facility]. SW sent RNCM an inbasket message. SW completed pain assessment and patient advised he was having pain in his left leg. Patient stated by-pass surgery failed and his leg may need amputation.   SDOH (Social Determinants of Health) assessments  performed: Yes     Plan:  patient will work with BSW to address needs related to Limited access to food Education officer, museum will follow up within 30 day.    [name], [place]  Social Worker [facility] Care Management  [phone]

## 2021-03-24 NOTE — Progress Notes (Signed)
Patient called [place] [facility]  Direct Dial: [phone]

## 2021-03-24 NOTE — Progress Notes (Signed)
     Subjective:     Patient ID: Jose Hansen, male   DOB: 1970-12-18, 50 y.o.   MRN: [id number]  HPI 50 year old male follows up after discussion yesterday of occluded left femoral to posterior tibial artery bypass graft.  This procedure was performed for rest pain with PTFE given that previous left lower extremity vein had been harvested.  An incisional wound VAC was placed at the time given dense scar tissue from previous 4 compartment fasciotomies.  He is subsequently been placed on doxycycline for drainage from the wound.  He now follows up for pain with known bypass occlusion.  He states that the pain feels like lightning going down his left leg.  Occasionally has cramping in the left foot.  He does protect his foot by wearing a thick shoe.  He does not have any tissue loss or ulceration.  He continues on Eliquis.   Review of Systems Left foot and leg pain as above    Objective:   Physical Exam Vitals:   03/24/21 0940  BP: 131/75  Pulse: 76  Resp: 20  Temp: 98.5 F (36.9 C)  SpO2: 95%   Awake alert oriented No lab respirations Left groin has some induration wound is healing Left below the knee wound with punctate opening no appreciable drainage today and was rewrapped Monophasic left posterior tibial artery signal Well-healed right above-knee amputation    Assessment:     50 year old male status post left femoral to posterior tibial artery bypass with now graft occlusion with no further revascularization options.    Plan:     I discussed with patient his options being either primary left above-knee amputation versus continued watchful waiting.  At this time we are back essentially where he was preoperatively except there is 1 small wound remaining which may drain to the bypass.  Certainly if he does have any evidence of infection this would push [place] towards amputation otherwise we can continue to wait as his foot actually looks relatively well perfused and there is a  monophasic distal posterior tibial signal.  He will follow-up in 3 months with repeat ABIs.  If he changes mind prior to this we can consider left above-knee amputation but would need to hold Eliquis for 48 hours prior.  Majestic Brister C. Donzetta Matters, MD [facility] [place] Office: [phone] Pager: [phone]

## 2021-03-24 NOTE — Chronic Care Management (AMB) (Signed)
  Care Management   Social Work Visit Note  03/23/2021 Name: Jose Hansen MRN: [id number] DOB: [birth year]  Jose Hansen is a 50 y.o. year old male who sees [name], MD for primary care. The care management team was consulted for assistance with care management and care coordination needs related to Transportation.  Patient was given the following information about care management and care coordination services today, agreed to services, and gave verbal consent: 1.care management/care coordination services include personalized support from designated clinical staff supervised by their physician, including individualized plan of care and coordination with other care providers 2. 24/7 contact phone numbers for assistance for urgent and routine care needs. 3. The patient may stop care management/care coordination services at any time by phone call to the office staff.  Engaged with patient by telephone for follow up visit in response to provider referral for social work chronic care management and care coordination services.  Assessment: Review of patient history, allergies, and health status during evaluation of patient need for care management/care coordination services.    Interventions:  Patient interviewed and appropriate assessments performed Collaborated with clinical team regarding patient needs  SW received emergent call stating patient needed assisting getting his medication. Patient was leaving a medical appointment and advised by staff he needed to pick up his prescription. Patient had no transportation to get medication. SW contacted patient and spoke briefly. Patient gave me permission to speak with the RN at his appointment. SW arranged for patient to have his medications delivered to his home. SW unable to complete assessment/SDOH as patient was finishing his medical appointment. Patient will be scheduled for an initial visit with SW to complete assessment.        Plan:   Patient will be scheduled for an initial visit with SW within the next 30 days.  Milus Hansen, Jose  Social Worker [facility] Care Management  [phone]

## 2021-03-24 NOTE — Patient Instructions (Signed)
Visit Information Instructions: patient will work with SW to address concerns related to community resources.  Patient was given the following information about care management and care coordination services today, agreed to services, and gave verbal consent: 1.care management/care coordination services include personalized support from designated clinical staff supervised by their physician, including individualized plan of care and coordination with other care providers 2. 24/7 contact phone numbers for assistance for urgent and routine care needs. 3. The patient may stop care management/care coordination services at any time by phone call to the office staff.  Patient verbalizes understanding of instructions provided today and agrees to view in MyChart.   The care management team will reach out to the patient again over the next 30 days.   Alok Minshall, BSW  Social Worker [facility] Care Management  [phone]      

## 2021-03-24 NOTE — Patient Instructions (Signed)
Visit Information  Instructions: patient will work with SW to address concerns related to food.  Patient was given the following information about care management and care coordination services today, agreed to services, and gave verbal consent: 1.care management/care coordination services include personalized support from designated clinical staff supervised by their physician, including individualized plan of care and coordination with other care providers 2. 24/7 contact phone numbers for assistance for urgent and routine care needs. 3. The patient may stop care management/care coordination services at any time by phone call to the office staff.  Patient verbalizes understanding of instructions provided today and agrees to view in [place].   The care management team will reach out to the patient again over the next 30 days.   Jose Hansen, [place]  Social Worker [facility] Care Management  [phone]

## 2021-03-25 ENCOUNTER — Other Ambulatory Visit: Payer: Self-pay

## 2021-03-25 DIAGNOSIS — I739 Peripheral vascular disease, unspecified: Secondary | ICD-10-CM

## 2021-03-26 ENCOUNTER — Ambulatory Visit: Payer: Medicare Other

## 2021-03-26 ENCOUNTER — Telehealth: Payer: Self-pay

## 2021-03-26 NOTE — Telephone Encounter (Signed)
  Care Management   Outreach Note  03/26/2021 Name: Jose Hansen MRN: [id number] DOB: [birth year]  Referred by: [name], MD Reason for referral : Chronic Care Management (HTN, PVD, Rt AKA)   An unsuccessful telephone outreach was attempted today. The patient was referred to the case management team for assistance with care management and care coordination.   Follow Up Plan: Face to Face appointment with care management team member scheduled for: 04/05/21 when patient comes to clinic for PCP appointment.  [name], RN, BSN, CCM Care Management Coordinator [facility] Phone: [phone]: [phone]

## 2021-03-29 ENCOUNTER — Ambulatory Visit: Payer: Medicare Other | Admitting: Behavioral Health

## 2021-03-30 NOTE — Progress Notes (Signed)
error 

## 2021-03-31 ENCOUNTER — Telehealth: Payer: Self-pay

## 2021-03-31 NOTE — Telephone Encounter (Signed)
Pt's [place] Langley Gauss with [facility]) called to ask if pt needed to go into ED when he is ready to have his amputation surgery. I have advised her to have pt call us to schedule a date. No further questions/concerns at this time.

## 2021-04-01 ENCOUNTER — Other Ambulatory Visit (HOSPITAL_COMMUNITY): Payer: Self-pay

## 2021-04-05 ENCOUNTER — Other Ambulatory Visit (HOSPITAL_COMMUNITY): Payer: Self-pay

## 2021-04-05 ENCOUNTER — Other Ambulatory Visit: Payer: Self-pay | Admitting: Physician Assistant

## 2021-04-05 ENCOUNTER — Other Ambulatory Visit: Payer: Self-pay

## 2021-04-05 ENCOUNTER — Telehealth: Payer: Self-pay

## 2021-04-05 ENCOUNTER — Encounter: Payer: Medicare Other | Admitting: Student

## 2021-04-05 ENCOUNTER — Ambulatory Visit: Payer: Medicare Other

## 2021-04-05 DIAGNOSIS — T8131XS Disruption of external operation (surgical) wound, not elsewhere classified, sequela: Secondary | ICD-10-CM

## 2021-04-05 DIAGNOSIS — R200 Anesthesia of skin: Secondary | ICD-10-CM

## 2021-04-05 DIAGNOSIS — Z419 Encounter for procedure for purposes other than remedying health state, unspecified: Secondary | ICD-10-CM

## 2021-04-05 DIAGNOSIS — I739 Peripheral vascular disease, unspecified: Secondary | ICD-10-CM

## 2021-04-05 MED ORDER — OXYCODONE-ACETAMINOPHEN 10-325 MG PO TABS
1.0000 | ORAL_TABLET | Freq: Four times a day (QID) | ORAL | 0 refills | Status: DC | PRN
  Filled 2021-04-05: qty 30, 8d supply, fill #0

## 2021-04-05 NOTE — Progress Notes (Signed)
Pt is scheduled for amputation in early December.  Will send Rx for Percocet 5/325 one q6h prn pain #30 no refill.     Leontine Locket, [facility] 04/05/2021 12:41 PM

## 2021-04-05 NOTE — Telephone Encounter (Signed)
Patient calls today to request pain medicine. He also needs to be scheduled for a L AKA. Per patient request, did not schedule AKA until 12/2 - he would like to see his son, go to the beach and do thanksgiving. Did send in a refill of pain med, but advised patient that he could schedule sooner surgery if medicine did not last.

## 2021-04-06 ENCOUNTER — Telehealth: Payer: Medicare Other

## 2021-04-06 ENCOUNTER — Other Ambulatory Visit (HOSPITAL_COMMUNITY): Payer: Self-pay

## 2021-04-07 ENCOUNTER — Ambulatory Visit: Payer: Medicare Other

## 2021-04-07 NOTE — Chronic Care Management (AMB) (Signed)
Care Management    RN Visit Note  04/07/2021 Name: Jose Hansen MRN: [id number] DOB: 05/13/1971  Subjective: Jose Hansen is a 50 y.o. year old male who is a primary care patient of [name], MD. The care management team was consulted for assistance with disease management and care coordination needs.    Engaged with patient by telephone for initial visit in response to provider referral for case management and/or care coordination services.   Consent to Services:   [name] was given information about Care Management services today including:  Care Management services includes personalized support from designated clinical staff supervised by his physician, including individualized plan of care and coordination with other care providers 24/7 contact phone numbers for assistance for urgent and routine care needs. The patient may stop case management services at any time by phone call to the office staff.  Patient agreed to services and consent obtained.   Assessment: Review of patient past medical history, allergies, medications, health status, including review of consultants reports, laboratory and other test data, was performed as part of comprehensive evaluation and provision of chronic care management services.   SDOH (Social Determinants of Health) assessments and interventions performed:    Care Plan  Allergies  Allergen Reactions   Contrast Media [Iodinated Diagnostic Agents] Nausea Only    NOT AN ALLERGY. ONLY NAUSEA. 12/11/20  Newly reported by [facility] on 12/04/20.    Outpatient Encounter Medications as of 04/07/2021  Medication Sig   acetaminophen (TYLENOL) 325 MG tablet Take 2 tablets (650 mg total) by mouth every 6 (six) hours as needed for moderate pain or mild pain.   apixaban (ELIQUIS) 5 MG TABS tablet Take 1 tablet (5 mg total) by mouth 2 (two) times daily.   aspirin EC 81 MG tablet Take 81 mg by mouth daily. Swallow whole.    atorvastatin (LIPITOR) 80 MG tablet Take 1 tablet (80 mg total) by mouth daily.   DULoxetine (CYMBALTA) 60 MG capsule Take 1 capsule (60 mg total) by mouth daily.   methocarbamol (ROBAXIN) 500 MG tablet Take 2 tablets (1,000 mg total) by mouth 3 (three) times daily.   oxyCODONE-acetaminophen (PERCOCET) 10-325 MG tablet Take 1 tablet by mouth every 6 (six) hours as needed for pain.   pregabalin (LYRICA) 100 MG capsule Take 1 capsule (100 mg total) by mouth 3 (three) times daily.   QUEtiapine (SEROQUEL) 50 MG tablet Take 3 tablets (150 mg total) by mouth at bedtime.   ciprofloxacin (CIPRO) 500 MG tablet Take 1 tablet (500 mg total) by mouth 2 (two) times daily.   doxycycline (VIBRA-TABS) 100 MG tablet Take 1 tablet (100 mg total) by mouth every 12 (twelve) hours for 23 doses   famotidine (PEPCID) 20 MG tablet Take 1 tablet (20 mg total) by mouth daily.   traMADol (ULTRAM) 50 MG tablet Take 1 tablet (50 mg total) by mouth every 6 (six) hours as needed.   No facility-administered encounter medications on file as of 04/07/2021.    Patient Active Problem List   Diagnosis Date Noted   Neuropathic pain    Leg swelling 02/05/2021   Post-op pain    Wound dehiscence    GERD (gastroesophageal reflux disease) 02/04/2021   Ischemia of extremity 01/14/2021   Hx of AKA (above knee amputation), right ([place]) 01/13/2021   Insomnia 01/08/2021   Right arm pain 12/03/2020   Depression 12/03/2020   Prediabetes 12/03/2020   DVT (deep venous thrombosis) ([place]) 11/24/2020   Essential  hypertension 11/24/2020   PVD (peripheral vascular disease) ([place]) 11/24/2020   Hyperlipidemia 11/24/2020    Conditions to be addressed/monitored: CAD, HTN, and Rt. AKA and pending Lt AKA  Care Plan : PVD-Managing with Bilateral Amputations  Updates made by [name], RN since 04/07/2021 12:00 AM     Problem: Adjustment to Life Changing Event (Coronary Artery Disease)      Long-Range Goal: Chronic Pain Managed   Start  Date: 04/07/2021  Priority: High  Note:   Current Barriers:  Knowledge Deficits related to plan of care for management of CAD, HTN, and Rt AKA and Pending Lt. AKA  Lacks caregiver support.        Film/video editor.  Transportation barriers  RNCM Clinical Goal(s):  Patient will take all medications exactly as prescribed and will call provider for medication related questions as evidenced by medication review knowledge with RNCM.     demonstrate ongoing health management independence as evidenced by calling ahead for refills and using pain medications as ordered.        continue to work with Consulting civil engineer and/or Social Worker to address care management and care coordination needs related to CAD and HTN as evidenced by adherence to CM Team Scheduled appointments     demonstrate ongoing self health care management ability to manage appointment and being proactive with needs for transition to having bilateral AKA. as evidenced by smooth transition after amputation  through collaboration with RN Care manager, provider, and care team. -Collaborating with [name], BSW to assist patient with housing, furniture needs.  Interventions: 1:1 collaboration with primary care provider regarding development and update of comprehensive plan of care as evidenced by provider attestation and co-signature Inter-disciplinary care team collaboration (see longitudinal plan of care) Evaluation of current treatment plan related to  self management and patient's adherence to plan as established by provider  Patient Goals/Self-Care Activities: Patient will self administer medications as prescribed as evidenced by self report/primary caregiver report  Patient will attend all scheduled provider appointments as evidenced by clinician review of documented attendance to scheduled appointments and patient/caregiver report Patient will call pharmacy for medication refills as evidenced by patient report and review of  pharmacy fill history as appropriate Patient will continue to perform ADL's independently as evidenced by patient/caregiver report Patient will call provider office for new concerns or questions as evidenced by review of documented incoming telephone call notes and patient report Patient will work with BSW to address care coordination needs and will continue to work with the clinical team to address health care and disease management related needs as evidenced by documented adherence to scheduled care management/care coordination appointments        Care Plan : General Plan of Care (Adult)  Updates made by [name], RN since 04/07/2021 12:00 AM     Problem: Mobility and Independence      Long-Range Goal: Mobility and Independence Optimized   Start Date: 04/07/2021  Priority: High  Note:   Current Barriers: Initial encounter with patient this afternoon.  He has been dealing with a lot of pain in his left leg and is scheduled to have an amputation on 05/01/21.  Patient has requested this RNCM to see if he could get an electric wheelchair as he feels his arms cannot tolerate continued use of standard wheelchair.  He is currently living with his brother who told him he cannot return to his home to live after his amputation as his home is not handicapped accessible and  he refuses to adapt home.  Patient has been approved for [facility] but he is waiting for them to notify him where there is an open apartment and he is not sure how long he will have to wait.  Patient does not have any furniture at all for his move.  Discussed if his surgery would be able to be moved to a later date (pending medical necessity) and he noted he is in so much pain he needs to have it as soon as possible.   Patient is aware he may need to go to a rehab after his surgery as he will not be able to be discharge unless he has a safe plan. Chronic Disease Management support and education needs related to CAD, HTN,  and Rt AKA and pending Lt AKA  RNCM Clinical Goal(s):  Patient will verbalize understanding of plan for management of CAD, HTN, and Right AKA and pending Lt. AKA verbalize basic understanding of  CHF, CAD, and Right AKA and pending Lt AKA disease process and self health management plan , maintaining mobility and independence. take all medications exactly as prescribed and will call provider for medication related questions demonstrate Improved adherence to prescribed treatment plan for CHF, CAD, and Rt. AKA and pending Lt AKA as evidenced by smooth transition-safety and mobility with bilateral AKA. work with Education officer, museum to address  related to the management of Limited social support, Transportation, Housing barriers, and ADL IADL limitations related to the management of CHF, CAD, and Bil amputations. demonstrate a decrease in CAD, HTN, and Bilateral amputation exacerbations of pain and mobility  through collaboration with RN Care manager, provider, and care team.   Interventions: 1:1 collaboration with primary care provider regarding development and update of comprehensive plan of care as evidenced by provider attestation and co-signature Inter-disciplinary care team collaboration (see longitudinal plan of care) Evaluation of current treatment plan related to  self management and patient's adherence to plan as established by provider- Advised patient he would need to come to the clinic for a face to face office visit for the MD to refer him for a mobility/Seating evaluation from PT for an electric wheelchair.  Message sent to [facility] front desk pool to schedule appointment and patient expecting call.  Pain Interventions: Pain assessment performed Medications reviewed Reviewed provider established plan for pain management;  Hypertension Interventions: Last practice recorded BP readings:  BP Readings from Last 3 Encounters:  03/24/21 131/75  03/23/21 124/75  02/24/21 (!) 145/88  Most recent  eGFR/CrCl:  Lab Results  Component Value Date   EGFR 97 12/03/2020    No components found for: CRCL  Evaluation of current treatment plan related to hypertension self management and patient's adherence to plan as established by provider; Reviewed medications with patient and discussed importance of compliance; Advised patient, providing education and rationale, to monitor blood pressure daily and record, calling PCP for findings outside established parameters;   Patient Goals/Self-Care Activities: Patient will self administer medications as prescribed Patient will attend all scheduled provider appointments Patient will call pharmacy for medication refills Patient will continue to perform ADL's independently Patient will call provider office for new concerns or questions Patient will work with BSW to address care coordination needs and will continue to work with the clinical team to address health care and disease management related needs.    Follow Up Plan:  Telephone follow up appointment with care management team member scheduled for:  Pending PCP appointment for power wheelchair  Evidence-based guidance:  Notes:       Plan: Telephone follow up appointment with care management team member scheduled for:  2 weeks  [name], RN, BSN, CCM Care Management Coordinator [facility] Phone: [phone]: [phone]

## 2021-04-07 NOTE — Patient Instructions (Signed)
Visit Information Patient will take all medications exactly as prescribed and will call provider for medication related questions as evidenced by medication review knowledge with RNCM.     demonstrate ongoing health management independence as evidenced by calling ahead for refills and using pain medications as ordered.        continue to work with Consulting civil engineer and/or Social Worker to address care management and care coordination needs related to CAD and HTN as evidenced by adherence to CM Team Scheduled appointments     demonstrate ongoing self health care management ability to manage appointment and being proactive with needs for transition to having bilateral AKA. as evidenced by smooth transition after surgery through collaboration with RN Care manager, provider, and care team. -Collaborating with [name], BSW to assist patient with housing, furniture needs   The patient verbalized understanding of instructions, educational materials, and care plan provided today and declined offer to receive copy of patient instructions, educational materials, and care plan.   Telephone follow up appointment with care management team member scheduled for:2 weeks or after clinic visit  [name], RN, BSN, CCM Care Management Coordinator [facility] Phone: [phone]: [phone]

## 2021-04-08 ENCOUNTER — Telehealth: Payer: Self-pay | Admitting: Internal Medicine

## 2021-04-08 NOTE — Telephone Encounter (Signed)
-----   Message from [name], RN sent at 04/07/2021 12:55 PM EST ----- Hello, Can you please call this patient and make him an appointment to come to the clinic for a face to face office visit to discuss power wheelchair? I know the patient missed his last appointment but he is having his other leg amputated (he already has one amputation) in December, and wants a power wheelchair as his arms cannot handle a manual wheelchair.  He is expecting a call.  Thank you, Alycia Rossetti, RN, BSN, [place] [facility] Phone: [phone]: [phone]

## 2021-04-08 NOTE — Telephone Encounter (Signed)
Please refer to message below.  Just spoke with patient and agreed to an appointment for next Thursday 04/15/21 at 10:45 am with Dr. Collene Gobble.

## 2021-04-09 ENCOUNTER — Telehealth: Payer: Medicare Other

## 2021-04-13 ENCOUNTER — Ambulatory Visit: Payer: Self-pay

## 2021-04-13 NOTE — Chronic Care Management (AMB) (Signed)
   04/13/2021  Jose Hansen 07-29-1970 [id number]  Multiple discussions regarding getting patient an electric wheelchair.  Spoke with Jenness Corner Dilday, PT who has a scheduled appointment with patient and vendor for the fitting of the electric wheelchair on May 04, 2021.  Patient is scheduled to have his amputation on 05/01/21 and would not be able to handle a fitting so close to his surgery.  Jenness Corner Dilday PT is working with the vendor to try and reschedule the fitting appointment.  Collaborated with Dr. Collene Gobble and updated him on the patients situation with the scheduled amputation and request for an electric wheelchair.   Dr. Collene Gobble will be seeing the patient for face to face evaluation for the wheelchair.  Attempted to reach patient to update him on the progress and left message on identified voicemail with return call information.  [name], RN, BSN, CCM Care Management Coordinator [facility] Phone: [phone]: [phone]

## 2021-04-14 ENCOUNTER — Encounter (HOSPITAL_COMMUNITY): Payer: Self-pay | Admitting: *Deleted

## 2021-04-14 ENCOUNTER — Other Ambulatory Visit: Payer: Self-pay

## 2021-04-14 ENCOUNTER — Telehealth: Payer: Self-pay

## 2021-04-14 ENCOUNTER — Inpatient Hospital Stay (HOSPITAL_COMMUNITY)
Admission: EM | Admit: 2021-04-14 | Discharge: 2021-04-28 | DRG: 240 | Disposition: A | Discharge status: Skilled Nursing Facility | Payer: Medicare Other | Attending: Internal Medicine | Admitting: Internal Medicine

## 2021-04-14 DIAGNOSIS — Z419 Encounter for procedure for purposes other than remedying health state, unspecified: Secondary | ICD-10-CM | POA: Diagnosis not present

## 2021-04-14 DIAGNOSIS — I739 Peripheral vascular disease, unspecified: Secondary | ICD-10-CM | POA: Diagnosis not present

## 2021-04-14 DIAGNOSIS — E7800 Pure hypercholesterolemia, unspecified: Secondary | ICD-10-CM | POA: Diagnosis present

## 2021-04-14 DIAGNOSIS — F419 Anxiety disorder, unspecified: Secondary | ICD-10-CM | POA: Diagnosis present

## 2021-04-14 DIAGNOSIS — M79605 Pain in left leg: Secondary | ICD-10-CM

## 2021-04-14 DIAGNOSIS — Z79899 Other long term (current) drug therapy: Secondary | ICD-10-CM

## 2021-04-14 DIAGNOSIS — Z89612 Acquired absence of left leg above knee: Secondary | ICD-10-CM

## 2021-04-14 DIAGNOSIS — Z87891 Personal history of nicotine dependence: Secondary | ICD-10-CM | POA: Diagnosis not present

## 2021-04-14 DIAGNOSIS — I10 Essential (primary) hypertension: Secondary | ICD-10-CM | POA: Diagnosis present

## 2021-04-14 DIAGNOSIS — I70622 Atherosclerosis of nonbiological bypass graft(s) of the extremities with rest pain, left leg: Principal | ICD-10-CM | POA: Diagnosis present

## 2021-04-14 DIAGNOSIS — Z7982 Long term (current) use of aspirin: Secondary | ICD-10-CM

## 2021-04-14 DIAGNOSIS — G4700 Insomnia, unspecified: Secondary | ICD-10-CM | POA: Diagnosis present

## 2021-04-14 DIAGNOSIS — Z7901 Long term (current) use of anticoagulants: Secondary | ICD-10-CM | POA: Diagnosis not present

## 2021-04-14 DIAGNOSIS — D62 Acute posthemorrhagic anemia: Secondary | ICD-10-CM | POA: Diagnosis not present

## 2021-04-14 DIAGNOSIS — Z91041 Radiographic dye allergy status: Secondary | ICD-10-CM | POA: Diagnosis not present

## 2021-04-14 DIAGNOSIS — Z86718 Personal history of other venous thrombosis and embolism: Secondary | ICD-10-CM

## 2021-04-14 DIAGNOSIS — D689 Coagulation defect, unspecified: Secondary | ICD-10-CM | POA: Diagnosis present

## 2021-04-14 DIAGNOSIS — K219 Gastro-esophageal reflux disease without esophagitis: Secondary | ICD-10-CM | POA: Diagnosis present

## 2021-04-14 DIAGNOSIS — M25511 Pain in right shoulder: Secondary | ICD-10-CM | POA: Diagnosis present

## 2021-04-14 DIAGNOSIS — F32A Depression, unspecified: Secondary | ICD-10-CM | POA: Diagnosis present

## 2021-04-14 DIAGNOSIS — I70222 Atherosclerosis of native arteries of extremities with rest pain, left leg: Secondary | ICD-10-CM | POA: Diagnosis not present

## 2021-04-14 DIAGNOSIS — F39 Unspecified mood [affective] disorder: Secondary | ICD-10-CM | POA: Diagnosis present

## 2021-04-14 DIAGNOSIS — L299 Pruritus, unspecified: Secondary | ICD-10-CM | POA: Diagnosis present

## 2021-04-14 DIAGNOSIS — Z20822 Contact with and (suspected) exposure to covid-19: Secondary | ICD-10-CM | POA: Diagnosis present

## 2021-04-14 DIAGNOSIS — M62838 Other muscle spasm: Secondary | ICD-10-CM | POA: Diagnosis not present

## 2021-04-14 DIAGNOSIS — R609 Edema, unspecified: Secondary | ICD-10-CM | POA: Diagnosis not present

## 2021-04-14 DIAGNOSIS — Z89511 Acquired absence of right leg below knee: Secondary | ICD-10-CM | POA: Diagnosis not present

## 2021-04-14 DIAGNOSIS — R200 Anesthesia of skin: Secondary | ICD-10-CM | POA: Diagnosis not present

## 2021-04-14 DIAGNOSIS — I998 Other disorder of circulatory system: Secondary | ICD-10-CM | POA: Diagnosis present

## 2021-04-14 LAB — COMPREHENSIVE METABOLIC PANEL
ALT: 24 U/L (ref 0–44)
AST: 20 U/L (ref 15–41)
Albumin: 3.7 g/dL (ref 3.5–5.0)
Alkaline Phosphatase: 67 U/L (ref 38–126)
Anion gap: 10 (ref 5–15)
BUN: 10 mg/dL (ref 6–20)
CO2: 23 mmol/L (ref 22–32)
Calcium: 8.9 mg/dL (ref 8.9–10.3)
Chloride: 103 mmol/L (ref 98–111)
Creatinine, Ser: 1.32 mg/dL — ABNORMAL HIGH (ref 0.61–1.24)
GFR, Estimated: >60 mL/min (ref >60)
Glucose, Bld: 93 mg/dL (ref 70–99)
Potassium: 3.9 mmol/L (ref 3.5–5.1)
Sodium: 136 mmol/L (ref 135–145)
Total Bilirubin: 0.9 mg/dL (ref 0.3–1.2)
Total Protein: 6.7 g/dL (ref 6.5–8.1)

## 2021-04-14 LAB — CBC
HCT: 41.2 % (ref 39.0–52.0)
Hemoglobin: 13 g/dL (ref 13.0–17.0)
MCH: 25.6 pg — ABNORMAL LOW (ref 26.0–34.0)
MCHC: 31.6 g/dL (ref 30.0–36.0)
MCV: 81.1 fL (ref 80.0–100.0)
Platelets: 298 10*3/uL (ref 150–400)
RBC: 5.08 MIL/uL (ref 4.22–5.81)
RDW: 12.7 % (ref 11.5–15.5)
WBC: 8.1 10*3/uL (ref 4.0–10.5)
nRBC: 0 % (ref 0.0–0.2)

## 2021-04-14 LAB — BASIC METABOLIC PANEL
Anion gap: 12 (ref 5–15)
BUN: 7 mg/dL (ref 6–20)
CO2: 22 mmol/L (ref 22–32)
Calcium: 9.5 mg/dL (ref 8.9–10.3)
Chloride: 103 mmol/L (ref 98–111)
Creatinine, Ser: 1.16 mg/dL (ref 0.61–1.24)
GFR, Estimated: >60 mL/min (ref >60)
Glucose, Bld: 98 mg/dL (ref 70–99)
Potassium: 3.8 mmol/L (ref 3.5–5.1)
Sodium: 137 mmol/L (ref 135–145)

## 2021-04-14 LAB — CBC WITH DIFFERENTIAL/PLATELET
Abs Immature Granulocytes: 0.04 10*3/uL (ref 0.00–0.07)
Basophils Absolute: 0.1 10*3/uL (ref 0.0–0.1)
Basophils Relative: 1 %
Eosinophils Absolute: 0.2 10*3/uL (ref 0.0–0.5)
Eosinophils Relative: 2 %
HCT: 46.7 % (ref 39.0–52.0)
Hemoglobin: 14.5 g/dL (ref 13.0–17.0)
Immature Granulocytes: 0 %
Lymphocytes Relative: 20 %
Lymphs Abs: 1.8 10*3/uL (ref 0.7–4.0)
MCH: 25.6 pg — ABNORMAL LOW (ref 26.0–34.0)
MCHC: 31 g/dL (ref 30.0–36.0)
MCV: 82.4 fL (ref 80.0–100.0)
Monocytes Absolute: 0.6 10*3/uL (ref 0.1–1.0)
Monocytes Relative: 6 %
Neutro Abs: 6.5 10*3/uL (ref 1.7–7.7)
Neutrophils Relative %: 71 %
Platelets: 340 10*3/uL (ref 150–400)
RBC: 5.67 MIL/uL (ref 4.22–5.81)
RDW: 12.5 % (ref 11.5–15.5)
WBC: 9.1 10*3/uL (ref 4.0–10.5)
nRBC: 0 % (ref 0.0–0.2)

## 2021-04-14 LAB — RESP PANEL BY RT-PCR (FLU A&B, COVID) ARPGX2
Influenza A by PCR: NEGATIVE
Influenza B by PCR: NEGATIVE
SARS Coronavirus 2 by RT PCR: NEGATIVE

## 2021-04-14 LAB — APTT: aPTT: 30 seconds (ref 24–36)

## 2021-04-14 LAB — PROTIME-INR
INR: 1 (ref 0.8–1.2)
INR: 1.1 (ref 0.8–1.2)
Prothrombin Time: 13.3 seconds (ref 11.4–15.2)
Prothrombin Time: 14.1 seconds (ref 11.4–15.2)

## 2021-04-14 MED ORDER — ATORVASTATIN CALCIUM 40 MG PO TABS
40.0000 mg | ORAL_TABLET | Freq: Every day | ORAL | Status: DC
  Administered 2021-04-15 – 2021-04-27 (×13): 40 mg via ORAL
  Filled 2021-04-14 (×14): qty 1

## 2021-04-14 MED ORDER — HYDROCODONE-ACETAMINOPHEN 5-325 MG PO TABS
1.0000 | ORAL_TABLET | Freq: Three times a day (TID) | ORAL | Status: DC
  Administered 2021-04-14 (×2): 1 via ORAL
  Filled 2021-04-14 (×3): qty 1

## 2021-04-14 MED ORDER — DULOXETINE HCL 60 MG PO CPEP
60.0000 mg | ORAL_CAPSULE | Freq: Every day | ORAL | Status: DC
  Administered 2021-04-15 – 2021-04-28 (×14): 60 mg via ORAL
  Filled 2021-04-14 (×13): qty 1

## 2021-04-14 MED ORDER — HYDROMORPHONE HCL 1 MG/ML IJ SOLN
1.0000 mg | INTRAMUSCULAR | Status: DC | PRN
  Administered 2021-04-14 – 2021-04-16 (×5): 1 mg via INTRAVENOUS
  Filled 2021-04-14 (×6): qty 1

## 2021-04-14 MED ORDER — MORPHINE SULFATE (PF) 4 MG/ML IV SOLN: 4.0000 mg | Freq: Once | INTRAVENOUS | Status: DC

## 2021-04-14 MED ORDER — HYDROMORPHONE HCL 1 MG/ML IJ SOLN
1.0000 mg | Freq: Once | INTRAMUSCULAR | Status: AC
  Administered 2021-04-14: 1 mg via INTRAVENOUS
  Filled 2021-04-14: qty 1

## 2021-04-14 MED ORDER — SENNOSIDES-DOCUSATE SODIUM 8.6-50 MG PO TABS
1.0000 | ORAL_TABLET | Freq: Every day | ORAL | Status: DC
  Administered 2021-04-14 – 2021-04-18 (×5): 1 via ORAL
  Filled 2021-04-14 (×12): qty 1

## 2021-04-14 MED ORDER — ACETAMINOPHEN 650 MG RE SUPP
650.0000 mg | Freq: Four times a day (QID) | RECTAL | Status: DC
  Filled 2021-04-14: qty 1

## 2021-04-14 MED ORDER — QUETIAPINE FUMARATE 50 MG PO TABS
150.0000 mg | ORAL_TABLET | Freq: Every day | ORAL | Status: DC
  Administered 2021-04-14 – 2021-04-27 (×14): 150 mg via ORAL
  Filled 2021-04-14 (×6): qty 3
  Filled 2021-04-14: qty 1
  Filled 2021-04-14 (×8): qty 3

## 2021-04-14 MED ORDER — ONDANSETRON HCL 4 MG/2ML IJ SOLN
4.0000 mg | Freq: Once | INTRAMUSCULAR | Status: AC
  Administered 2021-04-14: 4 mg via INTRAVENOUS
  Filled 2021-04-14: qty 2

## 2021-04-14 MED ORDER — FENTANYL CITRATE PF 50 MCG/ML IJ SOSY: 25.0000 ug | PREFILLED_SYRINGE | Freq: Once | INTRAMUSCULAR | Status: DC

## 2021-04-14 MED ORDER — POLYETHYLENE GLYCOL 3350 17 G PO PACK
17.0000 g | PACK | Freq: Every day | ORAL | Status: DC
  Filled 2021-04-14 (×3): qty 1

## 2021-04-14 MED ORDER — CEFAZOLIN SODIUM-DEXTROSE 2-4 GM/100ML-% IV SOLN
2.0000 g | INTRAVENOUS | Status: AC
  Administered 2021-04-15: 16:00:00 2 g via INTRAVENOUS
  Filled 2021-04-14 (×2): qty 100

## 2021-04-14 MED ORDER — CHLORHEXIDINE GLUCONATE CLOTH 2 % EX PADS: 6.0000 | MEDICATED_PAD | Freq: Once | CUTANEOUS | Status: DC

## 2021-04-14 MED ORDER — SODIUM CHLORIDE 0.9 % IV SOLN: INTRAVENOUS | Status: DC

## 2021-04-14 MED ORDER — PREGABALIN 100 MG PO CAPS
100.0000 mg | ORAL_CAPSULE | Freq: Three times a day (TID) | ORAL | Status: DC
  Administered 2021-04-14 – 2021-04-24 (×30): 100 mg via ORAL
  Filled 2021-04-14 (×31): qty 1

## 2021-04-14 MED ORDER — CHLORHEXIDINE GLUCONATE CLOTH 2 % EX PADS
6.0000 | MEDICATED_PAD | Freq: Once | CUTANEOUS | Status: AC
  Administered 2021-04-14: 6 via TOPICAL

## 2021-04-14 MED ORDER — FAMOTIDINE 20 MG PO TABS
20.0000 mg | ORAL_TABLET | Freq: Every day | ORAL | Status: DC
  Administered 2021-04-15 – 2021-04-28 (×14): 20 mg via ORAL
  Filled 2021-04-14 (×14): qty 1

## 2021-04-14 NOTE — ED Triage Notes (Signed)
Pt arrived by [facility] from home. Having left leg pain and is scheduled for amputation next month. Extensive vascular history and also began having arm pain yesterday.

## 2021-04-14 NOTE — Telephone Encounter (Signed)
Patient calls today to report his is still in pain and pain medicine is not working. Advised him we could not call in any more pain medicine, but could move up amputation surgery if he chose. He has opted to go to the hospital and see what they say.

## 2021-04-14 NOTE — ED Provider Notes (Signed)
[place] EMERGENCY DEPARTMENT Provider Note   CSN: [id number] Arrival date & time: 04/14/21  1248     History Chief Complaint  Patient presents with   Leg Pain    Ghali Z Vanpatten is a 50 y.o. male with a past medical history significant for hypertension, previous above the knee right amputation presents to the ED due to acute on chronic left lower extremity pain.  Patient is followed by Dr. Donzetta Matters with vascular surgery and has a scheduled left amputation on 05/01/2021 however, patient states the pain has become unbearable.  Patient has been taking Lyrica and Percocet with no relief in pain.  Patient also endorses lower extremity pruritus.  No new injuries.  Patient states pain radiates from his left foot all the way up to his left thigh.  No fever or chills.  No treatment prior to arrival.  Patient is chronically on Eliquis.  Chart reviewed.  Patient had a CT angiogram of his lower extremity on 9/9 which demonstrated patent inflow of the left lower extremity.  Patient also had a left femoral bypass on 01/20/2021.  History obtained from patient and past medical records. No interpreter used during encounter.       Past Medical History:  Diagnosis Date   Anxiety    Atypical chest pain 01/13/2021   Clotting disorder ([place])    Depression    H/O blood clots    Hypertension    Left leg weakness 11/24/2020    Patient Active Problem List   Diagnosis Date Noted   Neuropathic pain    Leg swelling 02/05/2021   Post-op pain    Wound dehiscence    GERD (gastroesophageal reflux disease) 02/04/2021   Ischemia of extremity 01/14/2021   Hx of AKA (above knee amputation), right ([place]) 01/13/2021   Insomnia 01/08/2021   Right arm pain 12/03/2020   Depression 12/03/2020   Prediabetes 12/03/2020   DVT (deep venous thrombosis) ([place]) 11/24/2020   Essential hypertension 11/24/2020   PVD (peripheral vascular disease) ([place]) 11/24/2020   Hyperlipidemia 11/24/2020    Past  Surgical History:  Procedure Laterality Date   ABDOMINAL AORTOGRAM W/LOWER EXTREMITY N/A 01/18/2021   Procedure: ABDOMINAL AORTOGRAM W/LOWER EXTREMITY;  Surgeon: Waynetta Sandy, MD;  Location: [place] [facility];  Service: Cardiovascular;  Laterality: N/A;   BELOW KNEE LEG AMPUTATION Right 2018   FEMORAL-TIBIAL BYPASS GRAFT Left 01/20/2021   Procedure: BYPASS GRAFT FEMORAL-PT ARTERY LEFT USING 6 MM X 80 CM GORE PROPATEN VASCULAR GRAFT REMOVABLE RING;  Surgeon: Waynetta Sandy, MD;  Location: [place];  Service: Vascular;  Laterality: Left;   WOUND DEBRIDEMENT Left 02/09/2021   Procedure: LEFT LEG WASHOUT AND WOUND CLOSURE;  Surgeon: Waynetta Sandy, MD;  Location: [place];  Service: Vascular;  Laterality: Left;       Family History  Problem Relation Age of Onset   CAD Neg Hx    Hypertension Neg Hx     Social History   Tobacco Use   Smoking status: Former    Types: Cigarettes   Smokeless tobacco: Never  Vaping Use   Vaping Use: Never used  Substance Use Topics   Alcohol use: Not Currently   Drug use: Not Currently    Home Medications Prior to Admission medications   Medication Sig Start Date End Date Taking? Authorizing Provider  acetaminophen (TYLENOL) 325 MG tablet Take 2 tablets (650 mg total) by mouth every 6 (six) hours as needed for moderate pain or mild pain. 01/26/21  [name], MD  apixaban (ELIQUIS) 5 MG TABS tablet Take 1 tablet (5 mg total) by mouth 2 (two) times daily. 01/08/21   Masters, Joellen Jersey, DO  aspirin EC 81 MG tablet Take 81 mg by mouth daily. Swallow whole.    [provider]  atorvastatin (LIPITOR) 80 MG tablet Take 1 tablet (80 mg total) by mouth daily. 01/08/21 04/08/21  Masters, Katie, DO  ciprofloxacin (CIPRO) 500 MG tablet Take 1 tablet (500 mg total) by mouth 2 (two) times daily. 02/19/21   Ulyses Amor, PA-C  doxycycline (VIBRA-TABS) 100 MG tablet Take 1 tablet (100 mg total) by mouth every 12 (twelve) hours for 23  doses 02/22/21   Ulyses Amor, PA-C  DULoxetine (CYMBALTA) 60 MG capsule Take 1 capsule (60 mg total) by mouth daily. 02/12/21   Farrel Gordon, DO  famotidine (PEPCID) 20 MG tablet Take 1 tablet (20 mg total) by mouth daily. 03/02/21 04/01/21  Lacinda Axon, MD  methocarbamol (ROBAXIN) 500 MG tablet Take 2 tablets (1,000 mg total) by mouth 3 (three) times daily. 03/18/21   Lacinda Axon, MD  oxyCODONE-acetaminophen (PERCOCET) 10-325 MG tablet Take 1 tablet by mouth every 6 (six) hours as needed for pain. 04/05/21   Rhyne, Hulen Shouts, PA-C  pregabalin (LYRICA) 100 MG capsule Take 1 capsule (100 mg total) by mouth 3 (three) times daily. 03/18/21 04/18/21  Lacinda Axon, MD  QUEtiapine (SEROQUEL) 50 MG tablet Take 3 tablets (150 mg total) by mouth at bedtime. 02/12/21   Farrel Gordon, DO  traMADol (ULTRAM) 50 MG tablet Take 1 tablet (50 mg total) by mouth every 6 (six) hours as needed. 02/18/21   Baglia, Corrina, PA-C    Allergies    Contrast media [iodinated diagnostic agents]  Review of Systems   Review of Systems  Constitutional:  Negative for chills and fever.  Respiratory:  Negative for shortness of breath.   Cardiovascular:  Negative for chest pain.  Gastrointestinal:  Negative for abdominal pain.  Musculoskeletal:  Positive for arthralgias and myalgias.  All other systems reviewed and are negative.  Physical Exam Updated Vital Signs BP (!) 136/103 (BP Location: Right Arm)   Pulse 82   Temp 98.8 F (37.1 C)   Resp 18   SpO2 97%   Physical Exam Vitals and nursing note reviewed.  Constitutional:      General: He is not in acute distress.    Appearance: He is not ill-appearing.  HENT:     Head: Normocephalic.  Eyes:     Pupils: Pupils are equal, round, and reactive to light.  Cardiovascular:     Rate and Rhythm: Normal rate and regular rhythm.     Pulses: Normal pulses.     Heart sounds: Normal heart sounds. No murmur heard.   No friction rub. No gallop.   Pulmonary:     Effort: Pulmonary effort is normal.     Breath sounds: Normal breath sounds.  Abdominal:     General: Abdomen is flat. There is no distension.     Palpations: Abdomen is soft.     Tenderness: There is no abdominal tenderness. There is no guarding or rebound.  Musculoskeletal:        General: Normal range of motion.     Cervical back: Neck supple.     Comments: Unable to palpate left lower extremity pedal pulses.  Unable to find pulses with Doppler at bedside.  Left lower extremity cold to touch.  Skin:    General:  Skin is warm and dry.     Comments: Chronic skin changes to left lower extremity.  Neurological:     General: No focal deficit present.     Mental Status: He is alert.  Psychiatric:        Mood and Affect: Mood normal.        Behavior: Behavior normal.    ED Results / Procedures / Treatments   Labs (all labs ordered are listed, but only abnormal results are displayed) Labs Reviewed  CBC WITH DIFFERENTIAL/PLATELET - Abnormal; Notable for the following components:      Result Value   MCH 25.6 (*)    All other components within normal limits  RESP PANEL BY RT-PCR (FLU A&B, COVID) ARPGX2  BASIC METABOLIC PANEL  PROTIME-INR    EKG None  Radiology No results found.  Procedures Procedures   Medications Ordered in ED Medications  fentaNYL (SUBLIMAZE) injection 25 mcg (25 mcg Intramuscular Not Given 04/14/21 1450)  HYDROmorphone (DILAUDID) injection 1 mg (1 mg Intravenous Given 04/14/21 1444)  ondansetron (ZOFRAN) injection 4 mg (4 mg Intravenous Given 04/14/21 1444)    ED Course  I have reviewed the triage vital signs and the nursing notes.  Pertinent labs & imaging results that were available during my care of the patient were reviewed by me and considered in my medical decision making (see chart for details).    MDM Rules/Calculators/A&P                          50 year old male presents to the ED due to chronic pulseless left lower  extremity associated with severe pain.  Patient chronically treated with Lyrica and Percocet however, notes his pain has been extremely severe over the past few days.  No fever or chills.  No recent injury.  Upon arrival, stable vitals.  Patient in no acute distress.  Pulseless left lower extremity.  Left lower extremity cold to touch.  Labs ordered at triage.   2:30 PM Discussed with Dr. Trula Slade with vascular who does not recommend further imaging. He notes patient will ultimately need an amputation; however it appears patient has been pushing off the surgery. Dr. Trula Slade will come speak to patient in the ED. He recommends medical admission with possible amputation tomorrow if patient is agreeable. Patient agreeable to admission.  3:21 PM Discussed with internal medicine who agrees to admit patient. COVID/Flu negative.  Final Clinical Impression(s) / ED Diagnoses Final diagnoses:  Left leg pain    Rx / DC Orders ED Discharge Orders     None        Suzy Bouchard, PA-C 04/14/21 1523    Horton, Alvin Critchley, DO 04/15/21 1548

## 2021-04-14 NOTE — H&P (View-Only) (Signed)
Vascular and Vein Specialist of [facility]  Patient name: Jose Hansen MRN: [id number] DOB: 1970/12/23 Sex: male   REQUESTING PROVIDER:    ER   REASON FOR CONSULT:    Ischemic left leg  HISTORY OF PRESENT ILLNESS:   Jose Hansen is a 50 y.o. male, who is status post right above-knee amputation.  In September he underwent an attempt at limb salvage with a left femoral to posterior tibial artery bypass graft with PTFE.  This was done for rest pain.  His bypass graft is occluded several weeks ago.  He does not have options for revascularization.  He has tried to manage the pain, but has been unsuccessful.  He is now ready for amputation.  The patient is on a statin for hypercholesterolemia.  He also takes Eliquis.  He is medically managed for hypertension.  He does have a clotting disorder.  PAST MEDICAL HISTORY    Past Medical History:  Diagnosis Date   Anxiety    Atypical chest pain 01/13/2021   Clotting disorder ([place])    Depression    H/O blood clots    Hypertension    Left leg weakness 11/24/2020     FAMILY HISTORY   Family History  Problem Relation Age of Onset   CAD Neg Hx    Hypertension Neg Hx     SOCIAL HISTORY:   Social History   Socioeconomic History   Marital status: Single    Spouse name: Not on file   Number of children: Not on file   Years of education: Not on file   Highest education level: Not on file  Occupational History   Not on file  Tobacco Use   Smoking status: Former    Types: Cigarettes   Smokeless tobacco: Never  Vaping Use   Vaping Use: Never used  Substance and Sexual Activity   Alcohol use: Not Currently   Drug use: Not Currently   Sexual activity: Yes  Other Topics Concern   Not on file  Social History Narrative   Not on file   Social Determinants of Health   Financial Resource Strain: Not on file  Food Insecurity: Food Insecurity Present   Worried About [place]  in the Last Year: Sometimes true   Ran Out of Food in the Last Year: Sometimes true  Transportation Needs: Unmet Transportation Needs   Lack of Transportation (Medical): Yes   Lack of Transportation (Non-Medical): No  Physical Activity: Not on file  Stress: Not on file  Social Connections: Not on file  Intimate Partner Violence: Not on file    ALLERGIES:    Allergies  Allergen Reactions   Contrast Media [Iodinated Diagnostic Agents] Nausea Only    NOT AN ALLERGY. ONLY NAUSEA. 12/11/20  Newly reported by [facility] on 12/04/20.    CURRENT MEDICATIONS:    Current Facility-Administered Medications  Medication Dose Route Frequency Provider Last Rate Last Admin   acetaminophen (TYLENOL) suppository 650 mg  650 mg Rectal Q6H WA Atway, Rayann N, DO       atorvastatin (LIPITOR) tablet 40 mg  40 mg Oral QHS Atway, Rayann N, DO       DULoxetine (CYMBALTA) DR capsule 60 mg  60 mg Oral Daily Atway, Rayann N, DO       famotidine (PEPCID) tablet 20 mg  20 mg Oral Daily Atway, Rayann N, DO       fentaNYL (SUBLIMAZE) injection 25 mcg  25 mcg Intramuscular Once Charmaine Downs  C, PA-C       HYDROcodone-acetaminophen (NORCO/VICODIN) 5-325 MG per tablet 1 tablet  1 tablet Oral Q8H Atway, Rayann N, DO       HYDROmorphone (DILAUDID) injection 1 mg  1 mg Intravenous Q4H PRN Atway, Rayann N, DO       polyethylene glycol (MIRALAX / GLYCOLAX) packet 17 g  17 g Oral Daily Atway, Rayann N, DO       pregabalin (LYRICA) capsule 100 mg  100 mg Oral TID Atway, Rayann N, DO       QUEtiapine (SEROQUEL) tablet 150 mg  150 mg Oral QHS Atway, Rayann N, DO       senna-docusate (Senokot-S) tablet 1 tablet  1 tablet Oral QHS Atway, Rayann N, DO       Current Outpatient Medications  Medication Sig Dispense Refill   acetaminophen (TYLENOL) 325 MG tablet Take 2 tablets (650 mg total) by mouth every 6 (six) hours as needed for moderate pain or mild pain.     apixaban (ELIQUIS) 5 MG TABS tablet Take 1 tablet  (5 mg total) by mouth 2 (two) times daily. 60 tablet 3   aspirin EC 81 MG tablet Take 81 mg by mouth daily. Swallow whole.     DULoxetine (CYMBALTA) 60 MG capsule Take 1 capsule (60 mg total) by mouth daily. 30 capsule 2   QUEtiapine (SEROQUEL) 50 MG tablet Take 3 tablets (150 mg total) by mouth at bedtime. 90 tablet 3   atorvastatin (LIPITOR) 80 MG tablet Take 1 tablet (80 mg total) by mouth daily. 90 tablet 1   cyclobenzaprine (FLEXERIL) 10 MG tablet Take 10 mg by mouth 3 (three) times daily. (Patient not taking: Reported on 04/14/2021)     famotidine (PEPCID) 20 MG tablet Take 1 tablet (20 mg total) by mouth daily. 30 tablet 0   methocarbamol (ROBAXIN) 500 MG tablet Take 2 tablets (1,000 mg total) by mouth 3 (three) times daily. (Patient not taking: No sig reported) 21 tablet 0   oxyCODONE-acetaminophen (PERCOCET) 10-325 MG tablet Take 1 tablet by mouth every 6 (six) hours as needed for pain. (Patient not taking: No sig reported) 30 tablet 0   pregabalin (LYRICA) 100 MG capsule Take 1 capsule (100 mg total) by mouth 3 (three) times daily. (Patient not taking: No sig reported) 90 capsule 0   traMADol (ULTRAM) 50 MG tablet Take 1 tablet (50 mg total) by mouth every 6 (six) hours as needed. (Patient not taking: No sig reported) 20 tablet 0    REVIEW OF SYSTEMS:   [X]  denotes positive finding, [ ]  denotes negative finding Cardiac  Comments:  Chest pain or chest pressure:    Shortness of breath upon exertion:    Short of breath when lying flat:    Irregular heart rhythm:        Vascular    Pain in calf, thigh, or hip brought on by ambulation: x   Pain in feet at night that wakes you up from your sleep:  x   Blood clot in your veins:    Leg swelling:         Pulmonary    Oxygen at home:    Productive cough:     Wheezing:         Neurologic    Sudden weakness in arms or legs:     Sudden numbness in arms or legs:     Sudden onset of difficulty speaking or slurred speech:    Temporary  loss of  vision in one eye:     Problems with dizziness:         Gastrointestinal    Blood in stool:      Vomited blood:         Genitourinary    Burning when urinating:     Blood in urine:        Psychiatric    Major depression:         Hematologic    Bleeding problems:    Problems with blood clotting too easily:        Skin    Rashes or ulcers:        Constitutional    Fever or chills:     PHYSICAL EXAM:   Vitals:   04/14/21 1252 04/14/21 1323 04/14/21 1448 04/14/21 1530  BP: (!) 149/91 (!) 163/103 (!) 136/103 120/87  Pulse: 91 79 82 81  Resp: 20 18 18 18   Temp: 98.8 F (37.1 C)     SpO2: 99% 100% 97% 99%    GENERAL: The patient is a well-nourished male, in no acute distress. The vital signs are documented above. CARDIAC: There is a regular rate and rhythm.  VASCULAR: Nonpalpable pedal pulse on the left PULMONARY: Nonlabored respirations ABDOMEN: Soft and non-tender with normal pitched bowel sounds.  MUSCULOSKELETAL: Right above-knee amputation NEUROLOGIC: No focal weakness or paresthesias are detected. SKIN: There are no ulcers or rashes noted. PSYCHIATRIC: The patient has a normal affect.  STUDIES:   None   ASSESSMENT and PLAN   Ischemic left leg.  The patient does not have options for revascularization.  He is recently status post left femoral posterior tibial bypass graft with PTFE.  This is known to be occluded.  He has tried to manage his pain with narcotics but is no longer able to do so.  His only option is an above-knee amputation.  He is now ready to have this done.  Again this needs to be an above-knee amputation.  I discussed this with the patient and he is in agreement.  He did take his Eliquis this morning.  This is going to be held today.  We will plan for left above-knee amputation sometime tomorrow.  He needs to be n.p.o. after midnight.   Leia Alf, MD, FACS [facility] [phone] Pager  [phone]

## 2021-04-14 NOTE — Addendum Note (Signed)
Addended by: Nicholas Lose on: 04/14/2021 03:13 PM   Modules accepted: Orders

## 2021-04-14 NOTE — ED Provider Notes (Signed)
Emergency Medicine Provider Triage Evaluation Note  Jose Hansen , a 50 y.o. male  was evaluated in triage.  Pt complains of left lower extremity pain that has been going on for the past few months.  Patient has an amputation scheduled on December 3 however does not feel as though he can make it that long.  Has been seen by vascular who has been giving him Lyrica and Percocet however these are not helping him.  Reports he is been taking them every couple hours with no relief. 10/10 pain.  Review of Systems  Positive: Pain Negative:   Physical Exam  BP (!) 149/91 (BP Location: Right Arm)   Pulse 91   Temp 98.8 F (37.1 C)   Resp 20   SpO2 99%  Gen:   Awake, no distress   Resp:  Normal effort  MSK:   Moves extremities without difficulty  Other:  Full range of motion at left ankle and MCPs.  No pulses present.  Unable to find pulses Doppler either.  Extremity is cool.  Medical Decision Making  Medically screening exam initiated at 1:02 PM.  Appropriate orders placed.  Jose Hansen was informed that the remainder of the evaluation will be completed by another provider, this initial triage assessment does not replace that evaluation, and the importance of remaining in the ED until their evaluation is complete.     Rhae Hammock, PA-C 04/14/21 1303    Lorelle Gibbs, DO 04/14/21 1411

## 2021-04-14 NOTE — H&P (Signed)
Date: 04/14/2021               Patient Name:  Jose Hansen MRN: [id number]  DOB: 1971/04/11 Age / Sex: 50 y.o., male   PCP: [name], MD         Medical Service: Internal Medicine Teaching Service         Attending Physician: Dr. Velna Ochs, MD    First Contact: Dr. Buddy Duty Pager: [phone]  Second Contact: Dr. 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 The patient is a patient of Dr. Donzetta Matters (vascular surgery) and has been scheduled for a left lower extremity amputation (BKA vs AKA) on 12/3, however, the patient states that his pain has become so severe since his last visit with vascular on 10/26, that he had to come to the ED. He has been taking Lyrica 100 mg tid and Percocet 10-325 mg q8h with no relief in his pain at all. He is in 10/10 severe pain, that is sharp and stabbing in nature. The patient is usually able to ambulate with the assistance of crutches, however, he can hardly do anything at this point because of the severity of his pain. He also states that his leg is severely itchy, which has been chronic, but recently has worsened along with his pain. He denies any recent illness, fevers, chills, chest pain, SOB, abd pain, n/v/d, dysuria, or any other symptoms at this time.    Meds:  Eliquis 5 mg bid Aspirin 81 mg Lipitor 80 mg Cymbalta 60 mg  Pepcid 20 mg Robaxin 1000 mg q8h prn Lyrica 100 mg tid Seroquel 150 mg qhs Tramadol 50 mg q6h prn Oxy-acetaminophen 10-325 mg q8h prn   Current Meds  Medication Sig   acetaminophen (TYLENOL) 325 MG tablet Take 2 tablets (650 mg total) by mouth every 6 (six) hours as needed for moderate pain or mild pain.   apixaban (ELIQUIS) 5 MG TABS tablet Take 1 tablet (5 mg total) by mouth 2 (two) times daily.   aspirin EC 81 MG tablet Take 81 mg by mouth daily. Swallow whole.   DULoxetine (CYMBALTA) 60 MG capsule Take 1 capsule (60 mg total) by mouth daily.   QUEtiapine (SEROQUEL) 50 MG tablet Take 3 tablets (150 mg total) by mouth at bedtime.     Allergies: Allergies as of 04/14/2021 - Review Complete 04/14/2021  Allergen Reaction Noted   Contrast media [iodinated diagnostic agents] Nausea Only 12/04/2020   Past Medical History:  Diagnosis Date   Anxiety    Atypical chest pain 01/13/2021   Clotting disorder ([place])    Depression    H/O blood clots    Hypertension    Left leg weakness 11/24/2020    Family History: No significant family history of cardiovascular disease that patient knows of  Social History: Patient is in between housing right now and has been living with his brother. Usually able to complete ADLs on own, but has had difficulties recently (2/2 to pain). No  tobacco or alcohol use.   Review of Systems: A complete ROS was negative except as per HPI.   Physical Exam: Blood pressure 120/87, pulse 81, temperature 98.8 F (37.1 C), resp. rate 18, SpO2 99 %. Physical Exam Constitutional:      General: He is in acute distress.     Appearance: He is not ill-appearing.  HENT:     Head: Normocephalic and atraumatic.  Eyes:     Pupils: Pupils are equal, round, and reactive to light.  Cardiovascular:     Rate and Rhythm: Normal rate and regular rhythm.     Heart sounds: Normal heart sounds. No murmur heard. Pulmonary:     Effort: Pulmonary effort is normal. No respiratory distress.     Breath sounds: Normal breath sounds. No wheezing or rales.  Abdominal:     General: Bowel sounds are normal. There is no  distension.     Palpations: Abdomen is soft.     Tenderness: There is no abdominal tenderness.  Musculoskeletal:     Comments: S/p R AKA Left leg s/p surgical scars from bypass surgeries (no erythema, edema, signs of infection) Unable to palpate LLE pulses  Skin:    General: Skin is dry.     Comments: Extremities are cool and dry  Neurological:     General: No focal deficit present.     Mental Status: He is alert and oriented to person, place, and time. Mental status is at baseline.  Psychiatric:        Mood and Affect: Mood normal.        Behavior: Behavior normal.     EKG: not performed this admission  CXR: not performed this admission  CT angio lower extremity 9/9: Patent inflow to the left lower extremity. Patent left fem-tib bypass graft which ties into the posterior tibial artery in the mid left calf. Posterior tibial artery is patent to the ankle.   Assessment & Plan by Problem: Active Problems:   Peripheral arterial disease (HCC)  #Peripheral arterial disease #S/P Right AKA #Left common fem to posterior tibial artery bypass  Patient presented with acute on chronic left leg pain that is severe, and described as stabbing in nature. This pain has been getting worse since the patient's last visit with vascular surgery on 10/26. The patient takes lyrica and percocet at home for his pain, and nothing has given him any relief. He has a history of previous right AKA many years ago and left common femoral to posterior tibial artery bypass with prosthetic graft in August of 2022. The surgical sites are all clean, dry, and intact without any surrounding erythema or edema. He is scheduled for a left leg amputation on 12/3 with vascular, and vascular surgery was consulted from the ED who recommended no further imaging and that the patient will require an amputation this admission (BKA vs AKA). Patient last took his Eliquis 5 mg this morning, and vascular is aware that this will not have  been held for 48 hrs prior to his surgery scheduled for tomorrow. Patient received 1 mg Dilaudid x2 in ED and is still in a severe amount of pain.  - NPO at midnight - Left AKA tomorrow with vascular surgery - Hydrocodone-acetaminophen q8h + Dilaudid 1 mg q4h PRN - Scheduled Miralax and Senokot-S - Resumed home Lyrica, Seroquel, and Cymbalta - Lipitor 40 mg daily  - Holding Eliquis - CBC, BMP, Lipid panel, A1c in the AM - Will require PT/OT after surgery  #History of  right common femoral DVT Patient states that he has been taking Eliquis 5 mg bid for many years for his previous DVT. He last took it this morning.  - Hold Eliquis in setting of surgery tomorrow  #Depression #Insomnia Resumed home Seroquel and Cymbalta   Dispo: Admit patient to Inpatient with expected length of stay greater than 2 midnights.  SignedDorethea Clan, DO 04/14/2021, 4:10 PM  Pager: [phone] After 5pm on weekdays and 1pm on weekends: On Call pager: [phone]

## 2021-04-14 NOTE — Hospital Course (Addendum)
Jose Hansen is a 50 y.o. with a pertinent PMH of HTN, HLD, severe PVD/PAD s/p R AKA who presented with acute on chronic left leg pain/ischemic left leg and admitted for severe PAD and is now s/p L AKA from this admission.   #Peripheral arterial disease #S/P Right AKA, chronic #Left common fem to posterior tibial artery bypass  #S/P Left AKA on 11/17 Patient presented with acute on chronic left leg pain that is severe, and described as stabbing in nature. This pain had been getting worse since the patient's last visit with vascular surgery on 10/26. The patient takes lyrica and percocet at home for his pain, and nothing has given him any relief. He has a history of previous right AKA many years ago and left common femoral to posterior tibial artery bypass with prosthetic graft in August of 2022. The surgical sites are all clean, dry, and intact without any surrounding erythema or edema. He was scheduled for a left leg amputation on 12/3 with vascular, and vascular surgery was consulted from the ED for his acute left leg pain, and they recommended no further imaging and that the patient undergo an amputation this admission. Underwent left AKA with vascular surgery on 11/17. Throughout hospitalization, the surgical site was clean and no signs of infection were noted. Patient was initially placed on a PCA pump for post-op pain control, however, we weaned this off. Trialed various different pain regimens for this patient, with oxycontin, oxycodone, and dilaudid being required most days. Weaned off dilaudid after patient was on oxycontin 30 mg bid + oxycodone 15 mg q6h. Will be discharged with these pain medications and will have follow up with the [facility] for tapering of these medications.  Appt is 12/7 with Dr. Alfonse Spruce  Patient also recommended for and requesting to have power wheelchair. He had a fitting scheduled with [employer] for wheelchair evaluation which had to be canceled given his recent  hospitalization. Manuela Schwartz ASNKNL is the physical therapist at [facility] who will need to be contacted in order to get his wheelchair while at SNF- recommend contact with her to get this process continued.    #History of right common femoral DVT Patient states that he has been taking Eliquis 5 mg bid for many years for his previous DVT. Continued this throughout admission after bleeding stabilized.   #Acute blood loss anemia Hb trending down 12.3>10.9>8.8>7.6 on hospital day 5. Patient is symptomatic from this, endorsing dizziness and lightheadedness. Denies any hematemesis, melena, or abd pain. Received 1u PRBC and Hb appropriately responded. Hb remained stable thereafter.    #Depression #Insomnia Resumed home Seroquel and Cymbalta

## 2021-04-14 NOTE — Consult Note (Signed)
Vascular and Vein Specialist of [facility]  Patient name: Jose Hansen MRN: [id number] DOB: 1970-07-22 Sex: male   REQUESTING PROVIDER:    ER   REASON FOR CONSULT:    Ischemic left leg  HISTORY OF PRESENT ILLNESS:   Jose Hansen is a 50 y.o. male, who is status post right above-knee amputation.  In September he underwent an attempt at limb salvage with a left femoral to posterior tibial artery bypass graft with PTFE.  This was done for rest pain.  His bypass graft is occluded several weeks ago.  He does not have options for revascularization.  He has tried to manage the pain, but has been unsuccessful.  He is now ready for amputation.  The patient is on a statin for hypercholesterolemia.  He also takes Eliquis.  He is medically managed for hypertension.  He does have a clotting disorder.  PAST MEDICAL HISTORY    Past Medical History:  Diagnosis Date   Anxiety    Atypical chest pain 01/13/2021   Clotting disorder ([place])    Depression    H/O blood clots    Hypertension    Left leg weakness 11/24/2020     FAMILY HISTORY   Family History  Problem Relation Age of Onset   CAD Neg Hx    Hypertension Neg Hx     SOCIAL HISTORY:   Social History   Socioeconomic History   Marital status: Single    Spouse name: Not on file   Number of children: Not on file   Years of education: Not on file   Highest education level: Not on file  Occupational History   Not on file  Tobacco Use   Smoking status: Former    Types: Cigarettes   Smokeless tobacco: Never  Vaping Use   Vaping Use: Never used  Substance and Sexual Activity   Alcohol use: Not Currently   Drug use: Not Currently   Sexual activity: Yes  Other Topics Concern   Not on file  Social History Narrative   Not on file   Social Determinants of Health   Financial Resource Strain: Not on file  Food Insecurity: Food Insecurity Present   Worried About Emily  in the Last Year: Sometimes true   Ran Out of Food in the Last Year: Sometimes true  Transportation Needs: Unmet Transportation Needs   Lack of Transportation (Medical): Yes   Lack of Transportation (Non-Medical): No  Physical Activity: Not on file  Stress: Not on file  Social Connections: Not on file  Intimate Partner Violence: Not on file    ALLERGIES:    Allergies  Allergen Reactions   Contrast Media [Iodinated Diagnostic Agents] Nausea Only    NOT AN ALLERGY. ONLY NAUSEA. 12/11/20  Newly reported by [facility] on 12/04/20.    CURRENT MEDICATIONS:    Current Facility-Administered Medications  Medication Dose Route Frequency Provider Last Rate Last Admin   acetaminophen (TYLENOL) suppository 650 mg  650 mg Rectal Q6H WA Atway, Rayann N, DO       atorvastatin (LIPITOR) tablet 40 mg  40 mg Oral QHS Atway, Rayann N, DO       DULoxetine (CYMBALTA) DR capsule 60 mg  60 mg Oral Daily Atway, Rayann N, DO       famotidine (PEPCID) tablet 20 mg  20 mg Oral Daily Atway, Rayann N, DO       fentaNYL (SUBLIMAZE) injection 25 mcg  25 mcg Intramuscular Once Charmaine Downs  C, PA-C       HYDROcodone-acetaminophen (NORCO/VICODIN) 5-325 MG per tablet 1 tablet  1 tablet Oral Q8H Atway, Rayann N, DO       HYDROmorphone (DILAUDID) injection 1 mg  1 mg Intravenous Q4H PRN Atway, Rayann N, DO       polyethylene glycol (MIRALAX / GLYCOLAX) packet 17 g  17 g Oral Daily Atway, Rayann N, DO       pregabalin (LYRICA) capsule 100 mg  100 mg Oral TID Atway, Rayann N, DO       QUEtiapine (SEROQUEL) tablet 150 mg  150 mg Oral QHS Atway, Rayann N, DO       senna-docusate (Senokot-S) tablet 1 tablet  1 tablet Oral QHS Atway, Rayann N, DO       Current Outpatient Medications  Medication Sig Dispense Refill   acetaminophen (TYLENOL) 325 MG tablet Take 2 tablets (650 mg total) by mouth every 6 (six) hours as needed for moderate pain or mild pain.     apixaban (ELIQUIS) 5 MG TABS tablet Take 1 tablet  (5 mg total) by mouth 2 (two) times daily. 60 tablet 3   aspirin EC 81 MG tablet Take 81 mg by mouth daily. Swallow whole.     DULoxetine (CYMBALTA) 60 MG capsule Take 1 capsule (60 mg total) by mouth daily. 30 capsule 2   QUEtiapine (SEROQUEL) 50 MG tablet Take 3 tablets (150 mg total) by mouth at bedtime. 90 tablet 3   atorvastatin (LIPITOR) 80 MG tablet Take 1 tablet (80 mg total) by mouth daily. 90 tablet 1   cyclobenzaprine (FLEXERIL) 10 MG tablet Take 10 mg by mouth 3 (three) times daily. (Patient not taking: Reported on 04/14/2021)     famotidine (PEPCID) 20 MG tablet Take 1 tablet (20 mg total) by mouth daily. 30 tablet 0   methocarbamol (ROBAXIN) 500 MG tablet Take 2 tablets (1,000 mg total) by mouth 3 (three) times daily. (Patient not taking: No sig reported) 21 tablet 0   oxyCODONE-acetaminophen (PERCOCET) 10-325 MG tablet Take 1 tablet by mouth every 6 (six) hours as needed for pain. (Patient not taking: No sig reported) 30 tablet 0   pregabalin (LYRICA) 100 MG capsule Take 1 capsule (100 mg total) by mouth 3 (three) times daily. (Patient not taking: No sig reported) 90 capsule 0   traMADol (ULTRAM) 50 MG tablet Take 1 tablet (50 mg total) by mouth every 6 (six) hours as needed. (Patient not taking: No sig reported) 20 tablet 0    REVIEW OF SYSTEMS:   [X]  denotes positive finding, [ ]  denotes negative finding Cardiac  Comments:  Chest pain or chest pressure:    Shortness of breath upon exertion:    Short of breath when lying flat:    Irregular heart rhythm:        Vascular    Pain in calf, thigh, or hip brought on by ambulation: x   Pain in feet at night that wakes you up from your sleep:  x   Blood clot in your veins:    Leg swelling:         Pulmonary    Oxygen at home:    Productive cough:     Wheezing:         Neurologic    Sudden weakness in arms or legs:     Sudden numbness in arms or legs:     Sudden onset of difficulty speaking or slurred speech:    Temporary  loss of  vision in one eye:     Problems with dizziness:         Gastrointestinal    Blood in stool:      Vomited blood:         Genitourinary    Burning when urinating:     Blood in urine:        Psychiatric    Major depression:         Hematologic    Bleeding problems:    Problems with blood clotting too easily:        Skin    Rashes or ulcers:        Constitutional    Fever or chills:     PHYSICAL EXAM:   Vitals:   04/14/21 1252 04/14/21 1323 04/14/21 1448 04/14/21 1530  BP: (!) 149/91 (!) 163/103 (!) 136/103 120/87  Pulse: 91 79 82 81  Resp: 20 18 18 18   Temp: 98.8 F (37.1 C)     SpO2: 99% 100% 97% 99%    GENERAL: The patient is a well-nourished male, in no acute distress. The vital signs are documented above. CARDIAC: There is a regular rate and rhythm.  VASCULAR: Nonpalpable pedal pulse on the left PULMONARY: Nonlabored respirations ABDOMEN: Soft and non-tender with normal pitched bowel sounds.  MUSCULOSKELETAL: Right above-knee amputation NEUROLOGIC: No focal weakness or paresthesias are detected. SKIN: There are no ulcers or rashes noted. PSYCHIATRIC: The patient has a normal affect.  STUDIES:   None   ASSESSMENT and PLAN   Ischemic left leg.  The patient does not have options for revascularization.  He is recently status post left femoral posterior tibial bypass graft with PTFE.  This is known to be occluded.  He has tried to manage his pain with narcotics but is no longer able to do so.  His only option is an above-knee amputation.  He is now ready to have this done.  Again this needs to be an above-knee amputation.  I discussed this with the patient and he is in agreement.  He did take his Eliquis this morning.  This is going to be held today.  We will plan for left above-knee amputation sometime tomorrow.  He needs to be n.p.o. after midnight.   Leia Alf, MD, FACS [facility] Specialists of [facility] [phone] Pager  [phone]

## 2021-04-15 ENCOUNTER — Encounter (HOSPITAL_COMMUNITY)
Admission: EM | Disposition: A | Discharge status: Skilled Nursing Facility | Payer: Self-pay | Source: Home / Self Care | Attending: Internal Medicine

## 2021-04-15 ENCOUNTER — Inpatient Hospital Stay (HOSPITAL_COMMUNITY): Payer: Medicare Other | Admitting: Anesthesiology

## 2021-04-15 ENCOUNTER — Encounter (HOSPITAL_COMMUNITY): Payer: Self-pay | Admitting: Internal Medicine

## 2021-04-15 ENCOUNTER — Encounter: Payer: Medicare Other | Admitting: Student

## 2021-04-15 ENCOUNTER — Inpatient Hospital Stay (HOSPITAL_COMMUNITY): Admission: RE | Admit: 2021-04-15 | Payer: Medicare Other | Source: Home / Self Care | Admitting: Vascular Surgery

## 2021-04-15 DIAGNOSIS — I739 Peripheral vascular disease, unspecified: Secondary | ICD-10-CM

## 2021-04-15 DIAGNOSIS — I998 Other disorder of circulatory system: Secondary | ICD-10-CM

## 2021-04-15 DIAGNOSIS — I70222 Atherosclerosis of native arteries of extremities with rest pain, left leg: Secondary | ICD-10-CM | POA: Diagnosis not present

## 2021-04-15 HISTORY — PX: AMPUTATION: SHX166

## 2021-04-15 LAB — BASIC METABOLIC PANEL
Anion gap: 9 (ref 5–15)
BUN: 11 mg/dL (ref 6–20)
CO2: 23 mmol/L (ref 22–32)
Calcium: 8.6 mg/dL — ABNORMAL LOW (ref 8.9–10.3)
Chloride: 102 mmol/L (ref 98–111)
Creatinine, Ser: 1.21 mg/dL (ref 0.61–1.24)
GFR, Estimated: >60 mL/min (ref >60)
Glucose, Bld: 84 mg/dL (ref 70–99)
Potassium: 3.8 mmol/L (ref 3.5–5.1)
Sodium: 134 mmol/L — ABNORMAL LOW (ref 135–145)

## 2021-04-15 LAB — LIPID PANEL
Cholesterol: 266 mg/dL — ABNORMAL HIGH (ref 0–200)
HDL: 31 mg/dL — ABNORMAL LOW (ref >40)
LDL Cholesterol: 159 mg/dL — ABNORMAL HIGH (ref 0–99)
Total CHOL/HDL Ratio: 8.6 RATIO
Triglycerides: 378 mg/dL — ABNORMAL HIGH (ref <150)
VLDL: 76 mg/dL — ABNORMAL HIGH (ref 0–40)

## 2021-04-15 LAB — CBC
HCT: 39 % (ref 39.0–52.0)
Hemoglobin: 12.3 g/dL — ABNORMAL LOW (ref 13.0–17.0)
MCH: 25.6 pg — ABNORMAL LOW (ref 26.0–34.0)
MCHC: 31.5 g/dL (ref 30.0–36.0)
MCV: 81.3 fL (ref 80.0–100.0)
Platelets: 293 10*3/uL (ref 150–400)
RBC: 4.8 MIL/uL (ref 4.22–5.81)
RDW: 12.7 % (ref 11.5–15.5)
WBC: 7.2 10*3/uL (ref 4.0–10.5)
nRBC: 0 % (ref 0.0–0.2)

## 2021-04-15 LAB — HEMOGLOBIN A1C
Hgb A1c MFr Bld: 5.6 % (ref 4.8–5.6)
Mean Plasma Glucose: 114.02 mg/dL

## 2021-04-15 LAB — TYPE AND SCREEN
ABO/RH(D): AB POS
Antibody Screen: NEGATIVE

## 2021-04-15 LAB — MRSA NEXT GEN BY PCR, NASAL: MRSA by PCR Next Gen: NOT DETECTED

## 2021-04-15 SURGERY — AMPUTATION, ABOVE KNEE
Anesthesia: General | Site: Knee | Laterality: Left

## 2021-04-15 MED ORDER — 0.9 % SODIUM CHLORIDE (POUR BTL) OPTIME
TOPICAL | Status: DC | PRN
  Administered 2021-04-15: 16:00:00 1000 mL

## 2021-04-15 MED ORDER — DEXAMETHASONE SODIUM PHOSPHATE 10 MG/ML IJ SOLN
INTRAMUSCULAR | Status: AC
  Filled 2021-04-15: qty 1

## 2021-04-15 MED ORDER — LACTATED RINGERS IV SOLN: INTRAVENOUS | Status: DC | PRN

## 2021-04-15 MED ORDER — MIDAZOLAM HCL 2 MG/2ML IJ SOLN
INTRAMUSCULAR | Status: DC | PRN
  Administered 2021-04-15: 2 mg via INTRAVENOUS

## 2021-04-15 MED ORDER — MIDAZOLAM HCL 2 MG/2ML IJ SOLN
INTRAMUSCULAR | Status: AC
  Filled 2021-04-15: qty 2

## 2021-04-15 MED ORDER — PROMETHAZINE HCL 25 MG/ML IJ SOLN: 6.2500 mg | INTRAMUSCULAR | Status: DC | PRN

## 2021-04-15 MED ORDER — HYDRALAZINE HCL 20 MG/ML IJ SOLN
10.0000 mg | Freq: Once | INTRAMUSCULAR | Status: AC
  Administered 2021-04-15: 19:00:00 10 mg via INTRAVENOUS

## 2021-04-15 MED ORDER — LIDOCAINE HCL (CARDIAC) PF 100 MG/5ML IV SOSY
PREFILLED_SYRINGE | INTRAVENOUS | Status: DC | PRN
  Administered 2021-04-15: 60 mg via INTRATRACHEAL

## 2021-04-15 MED ORDER — FENTANYL CITRATE (PF) 250 MCG/5ML IJ SOLN
INTRAMUSCULAR | Status: AC
  Filled 2021-04-15: qty 5

## 2021-04-15 MED ORDER — DEXAMETHASONE SODIUM PHOSPHATE 4 MG/ML IJ SOLN
INTRAMUSCULAR | Status: DC | PRN
  Administered 2021-04-15: 5 mg via INTRAVENOUS

## 2021-04-15 MED ORDER — ONDANSETRON HCL 4 MG/2ML IJ SOLN
INTRAMUSCULAR | Status: AC
  Filled 2021-04-15: qty 2

## 2021-04-15 MED ORDER — MEPERIDINE HCL 25 MG/ML IJ SOLN: 6.2500 mg | INTRAMUSCULAR | Status: DC | PRN

## 2021-04-15 MED ORDER — HYDROMORPHONE HCL 1 MG/ML IJ SOLN
INTRAMUSCULAR | Status: AC
  Filled 2021-04-15: qty 2

## 2021-04-15 MED ORDER — METHOCARBAMOL 500 MG PO TABS
500.0000 mg | ORAL_TABLET | Freq: Three times a day (TID) | ORAL | Status: DC | PRN
  Administered 2021-04-15 – 2021-04-22 (×13): 500 mg via ORAL
  Filled 2021-04-15 (×15): qty 1

## 2021-04-15 MED ORDER — OXYCODONE HCL 5 MG PO TABS
5.0000 mg | ORAL_TABLET | Freq: Once | ORAL | Status: AC | PRN
  Administered 2021-04-15: 20:00:00 5 mg via ORAL

## 2021-04-15 MED ORDER — HYDROMORPHONE HCL 1 MG/ML IJ SOLN
0.2500 mg | INTRAMUSCULAR | Status: DC | PRN
  Administered 2021-04-15 (×4): 0.5 mg via INTRAVENOUS

## 2021-04-15 MED ORDER — ACETAMINOPHEN 325 MG PO TABS
650.0000 mg | ORAL_TABLET | Freq: Four times a day (QID) | ORAL | Status: DC | PRN
  Administered 2021-04-16 (×2): 650 mg via ORAL
  Filled 2021-04-15 (×2): qty 2

## 2021-04-15 MED ORDER — GLYCOPYRROLATE 0.2 MG/ML IJ SOLN
INTRAMUSCULAR | Status: DC | PRN
  Administered 2021-04-15: .2 mg via INTRAVENOUS

## 2021-04-15 MED ORDER — HYDROMORPHONE HCL 1 MG/ML IJ SOLN
INTRAMUSCULAR | Status: AC
  Filled 2021-04-15: qty 1

## 2021-04-15 MED ORDER — OXYCODONE HCL 5 MG PO TABS
ORAL_TABLET | ORAL | Status: AC
  Filled 2021-04-15: qty 1

## 2021-04-15 MED ORDER — OXYCODONE HCL 5 MG/5ML PO SOLN: 5.0000 mg | Freq: Once | ORAL | Status: AC | PRN

## 2021-04-15 MED ORDER — HYDROMORPHONE HCL 1 MG/ML IJ SOLN
0.2500 mg | INTRAMUSCULAR | Status: DC | PRN
  Administered 2021-04-15 (×4): 0.5 mg via INTRAVENOUS

## 2021-04-15 MED ORDER — FENTANYL CITRATE (PF) 250 MCG/5ML IJ SOLN
INTRAMUSCULAR | Status: DC | PRN
  Administered 2021-04-15 (×2): 50 ug via INTRAVENOUS
  Administered 2021-04-15: 100 ug via INTRAVENOUS
  Administered 2021-04-15: 50 ug via INTRAVENOUS

## 2021-04-15 MED ORDER — CHLORHEXIDINE GLUCONATE 0.12 % MT SOLN
OROMUCOSAL | Status: AC
  Administered 2021-04-15: 13:00:00 15 mL
  Filled 2021-04-15: qty 15

## 2021-04-15 MED ORDER — HYDROMORPHONE HCL 1 MG/ML IJ SOLN
1.0000 mg | Freq: Once | INTRAMUSCULAR | Status: AC
  Administered 2021-04-15: 1 mg via INTRAVENOUS

## 2021-04-15 MED ORDER — AMISULPRIDE (ANTIEMETIC) 5 MG/2ML IV SOLN: 10.0000 mg | Freq: Once | INTRAVENOUS | Status: DC | PRN

## 2021-04-15 MED ORDER — PROPOFOL 10 MG/ML IV BOLUS
INTRAVENOUS | Status: DC | PRN
  Administered 2021-04-15: 200 mg via INTRAVENOUS

## 2021-04-15 MED ORDER — HYDRALAZINE HCL 20 MG/ML IJ SOLN
INTRAMUSCULAR | Status: AC
  Filled 2021-04-15: qty 1

## 2021-04-15 MED ORDER — HYDROCODONE-ACETAMINOPHEN 5-325 MG PO TABS
2.0000 | ORAL_TABLET | Freq: Three times a day (TID) | ORAL | Status: DC
  Administered 2021-04-15 – 2021-04-16 (×3): 2 via ORAL
  Filled 2021-04-15 (×3): qty 2

## 2021-04-15 SURGICAL SUPPLY — 72 items
APL PRP STRL LF DISP 70% ISPRP (MISCELLANEOUS) ×1
BAG COUNTER SPONGE SURGICOUNT (BAG) ×2 IMPLANT
BAG SPNG CNTER NS LX DISP (BAG) ×1
BANDAGE ESMARK 6X9 LF (GAUZE/BANDAGES/DRESSINGS) ×1 IMPLANT
BIT DRILL 5/64X5 DISP (BIT) IMPLANT
BLADE SAGITTAL (BLADE)
BLADE SAGITTAL 25.0X1.19X90 (BLADE) IMPLANT
BLADE SAW GIGLI 510 (BLADE) ×2 IMPLANT
BLADE SAW THK.89X75X18XSGTL (BLADE) IMPLANT
BLADE SURG 21 STRL SS (BLADE) ×2 IMPLANT
BNDG CMPR 9X6 STRL LF SNTH (GAUZE/BANDAGES/DRESSINGS) ×1
BNDG COHESIVE 6X5 TAN STRL LF (GAUZE/BANDAGES/DRESSINGS) ×2 IMPLANT
BNDG ELASTIC 4X5.8 VLCR STR LF (GAUZE/BANDAGES/DRESSINGS) ×2 IMPLANT
BNDG ELASTIC 6X5.8 VLCR STR LF (GAUZE/BANDAGES/DRESSINGS) ×2 IMPLANT
BNDG ESMARK 6X9 LF (GAUZE/BANDAGES/DRESSINGS) ×2
BNDG GAUZE ELAST 4 BULKY (GAUZE/BANDAGES/DRESSINGS) ×3 IMPLANT
CANISTER SUCT 3000ML PPV (MISCELLANEOUS) ×2 IMPLANT
CHLORAPREP W/TINT 26 (MISCELLANEOUS) ×2 IMPLANT
CLIP VESOCCLUDE MED 6/CT (CLIP) ×2 IMPLANT
COVER SURGICAL LIGHT HANDLE (MISCELLANEOUS) ×2 IMPLANT
CUFF TOURN SGL QUICK 24 (TOURNIQUET CUFF)
CUFF TOURN SGL QUICK 34 (TOURNIQUET CUFF)
CUFF TRNQT CYL 24X4X16.5-23 (TOURNIQUET CUFF) IMPLANT
CUFF TRNQT CYL 34X4.125X (TOURNIQUET CUFF) IMPLANT
DRAIN CHANNEL 19F RND (DRAIN) IMPLANT
DRAPE DERMATAC (DRAPES) IMPLANT
DRAPE HALF SHEET 40X57 (DRAPES) ×2 IMPLANT
DRAPE INCISE IOBAN 66X45 STRL (DRAPES) IMPLANT
DRAPE ORTHO SPLIT 77X108 STRL (DRAPES) ×4
DRAPE SURG ORHT 6 SPLT 77X108 (DRAPES) ×2 IMPLANT
DRESSING PREVENA PLUS CUSTOM (GAUZE/BANDAGES/DRESSINGS) IMPLANT
DRSG ADAPTIC 3X8 NADH LF (GAUZE/BANDAGES/DRESSINGS) ×1 IMPLANT
DRSG PREVENA PLUS CUSTOM (GAUZE/BANDAGES/DRESSINGS)
ELECT CAUTERY BLADE 6.4 (BLADE) ×2 IMPLANT
ELECT REM PT RETURN 9FT ADLT (ELECTROSURGICAL) ×2
ELECTRODE REM PT RTRN 9FT ADLT (ELECTROSURGICAL) ×1 IMPLANT
EVACUATOR SILICONE 100CC (DRAIN) IMPLANT
GAUZE SPONGE 4X4 12PLY STRL (GAUZE/BANDAGES/DRESSINGS) ×2 IMPLANT
GAUZE SPONGE 4X4 12PLY STRL LF (GAUZE/BANDAGES/DRESSINGS) ×1 IMPLANT
GAUZE XEROFORM 5X9 LF (GAUZE/BANDAGES/DRESSINGS) ×2 IMPLANT
GLOVE SURG POLYISO LF SZ8 (GLOVE) ×2 IMPLANT
GOWN STRL REUS W/ TWL LRG LVL3 (GOWN DISPOSABLE) ×2 IMPLANT
GOWN STRL REUS W/ TWL XL LVL3 (GOWN DISPOSABLE) ×1 IMPLANT
GOWN STRL REUS W/TWL LRG LVL3 (GOWN DISPOSABLE) ×4
GOWN STRL REUS W/TWL XL LVL3 (GOWN DISPOSABLE) ×2
KIT BASIN OR (CUSTOM PROCEDURE TRAY) ×2 IMPLANT
KIT TURNOVER KIT B (KITS) ×2 IMPLANT
NS IRRIG 1000ML POUR BTL (IV SOLUTION) ×2 IMPLANT
PACK GENERAL/GYN (CUSTOM PROCEDURE TRAY) ×2 IMPLANT
PAD ARMBOARD 7.5X6 YLW CONV (MISCELLANEOUS) ×4 IMPLANT
PENCIL SMOKE EVACUATOR (MISCELLANEOUS) ×2 IMPLANT
PREVENA RESTOR ARTHOFORM 46X30 (CANNISTER) IMPLANT
STAPLER SKIN 35 REG (STAPLE) ×2 IMPLANT
STAPLER VISISTAT 35W (STAPLE) ×2 IMPLANT
STOCKINETTE IMPERVIOUS LG (DRAPES) ×2 IMPLANT
SUT ETHILON 2 0 PSLX (SUTURE) ×4 IMPLANT
SUT ETHILON 3 0 PS 1 (SUTURE) IMPLANT
SUT FIBERWIRE #5 38 CONV BLUE (SUTURE)
SUT SILK 0 TIES 10X30 (SUTURE) ×2 IMPLANT
SUT SILK 2 0 (SUTURE) ×2
SUT SILK 2 0 SH CR/8 (SUTURE) ×2 IMPLANT
SUT SILK 2-0 18XBRD TIE 12 (SUTURE) ×1 IMPLANT
SUT SILK 3 0 (SUTURE)
SUT SILK 3-0 18XBRD TIE 12 (SUTURE) IMPLANT
SUT VIC AB 0 CT1 18XCR BRD 8 (SUTURE) IMPLANT
SUT VIC AB 0 CT1 8-18 (SUTURE) ×4
SUT VIC AB 2-0 CT1 18 (SUTURE) ×4 IMPLANT
SUTURE FIBERWR #5 38 CONV BLUE (SUTURE) IMPLANT
TAPE UMBILICAL COTTON 1/8X30 (MISCELLANEOUS) ×2 IMPLANT
TOWEL GREEN STERILE (TOWEL DISPOSABLE) ×4 IMPLANT
UNDERPAD 30X36 HEAVY ABSORB (UNDERPADS AND DIAPERS) ×2 IMPLANT
WATER STERILE IRR 1000ML POUR (IV SOLUTION) ×2 IMPLANT

## 2021-04-15 NOTE — Interval H&P Note (Signed)
History and Physical Interval Note:  04/15/2021 2:44 PM  Arno Z Sargent  has presented today for surgery, with the diagnosis of PVD.  The various methods of treatment have been discussed with the patient and family. After consideration of risks, benefits and other options for treatment, the patient has consented to  Procedure(s): LEFT ABOVE KNEE AMPUTATION (Left) as a surgical intervention.  The patient's history has been reviewed, patient examined, no change in status, stable for surgery.  I have reviewed the patient's chart and labs.  Questions were answered to the patient's satisfaction.     Cherre Robins

## 2021-04-15 NOTE — Progress Notes (Signed)
   HD#1 SUBJECTIVE:  Patient Summary: Jose Hansen is a 50 y.o. with a pertinent PMH of HTN, HLD, severe PVD/PAD s/p R AKA and s/p left femoral to posterior tibial artery bypass who presented with left leg pain and admitted for severe PAD.   Overnight Events: No acute events overnight  Interim History: This is hospital day 1 for [name] who was seen and evaluated at the bedside this morning. He reports continued severe pain. Dilaudid was only temporarily successful in treating this, but he is more comfortable than yesterday. He reports a cramp in this L foot.   OBJECTIVE:  Vital Signs: Vitals:   04/14/21 1530 04/14/21 1850 04/15/21 0457 04/15/21 0457  BP: 120/87 133/85 109/72 109/72  Pulse: 81 77 63 63  Resp: 18 18 18 18   Temp:  98.2 F (36.8 C) (!) 97.4 F (36.3 C) (!) 97.4 F (36.3 C)  TempSrc:  Oral Oral Oral  SpO2: 99% 99% 98% 99%  Weight:  99.8 kg 96.3 kg   Height:  5\' 11"  (1.803 m)     Supplemental O2: Room Air SpO2: 99 %  Filed Weights   04/14/21 1850 04/15/21 0457  Weight: 99.8 kg 96.3 kg    No intake or output data in the 24 hours ending 04/15/21 0719 Net IO Since Admission: No IO data has been entered for this period [04/15/21 0719]  Physical Exam: General: Mild acute distress. Head: Normocephalic. Atraumatic. CV: RRR. No murmurs, rubs, or gallops. No LE edema Pulmonary: Lungs CTAB. Normal effort. No wheezing or rales. Abdominal: Soft, nontender, nondistended. Normal bowel sounds. Extremities: S/p R AKA. Unable to palpate LLE pulses and LLE is cool to touch. Left leg with surgical scars from previous bypass surgeries Skin: Cool and dry. No obvious rash or lesions. Neuro: A&Ox3. No focal deficit. Psych: Normal mood and affect    ASSESSMENT/PLAN:  Assessment: Active Problems:   Peripheral arterial disease (HCC)   Plan: #Peripheral arterial disease #Ischemic left leg #S/P Right AKA #Left common fem to posterior tibial artery bypass   Patient is s/p left femoral to posterior tibial bypass graft that is known to be occluded. Patient previously had AKA scheduled with vascularon 12/3, however, pain was unable to be controlled and he presented to the hospital. Patient's pain remains very severe when evaluated at the bedside this morning, however, he is slightly more comfortable than when he came in. The dilaudid has helped somewhat to control his pain, but he notes that it does not last long. Will consider PCA pump for this patient after surgery. Discussed plan for left AKA today with vascular surgery, and patient is agreeable - Left AKA today with vascular surgery - Hydrocodone-acetaminophen q8h + Dilaudid 1 mg q4h PRN, will consider PCA pump - Scheduled Miralax and Senokot-S - Resumed home Lyrica, Seroquel, and Cymbalta - Lipitor 40 mg daily  - Holding Eliquis  - Will require PT/OT after surgery  #Hx of R common femoral DVT Continue to hold Eliquis in the setting of surgery   Best Practice: Diet: NPO IVF: Fluids: none VTE: Held in setting of surgery Code: Full AB: None Therapy Recs: Pending DISPO: Anticipated discharge pending Medical stability.  Signature: Buddy Duty, D.O.  Internal Medicine Resident, PGY-1 [facility]  Pager: [phone] 7:19 AM, 04/15/2021   Please contact the on call pager after 5 pm and on weekends at [phone].

## 2021-04-15 NOTE — Anesthesia Procedure Notes (Signed)
Procedure Name: LMA Insertion Date/Time: 04/15/2021 3:42 PM Performed by: Moshe Salisbury, CRNA Pre-anesthesia Checklist: Patient identified, Emergency Drugs available, Suction available and Patient being monitored Patient Re-evaluated:Patient Re-evaluated prior to induction Oxygen Delivery Method: Circle System Utilized Preoxygenation: Pre-oxygenation with 100% oxygen Induction Type: IV induction Ventilation: Mask ventilation without difficulty LMA: LMA inserted LMA Size: 5.0 Number of attempts: 1 Placement Confirmation: positive ETCO2 Tube secured with: Tape Dental Injury: Teeth and Oropharynx as per pre-operative assessment

## 2021-04-15 NOTE — Anesthesia Preprocedure Evaluation (Signed)
Anesthesia Evaluation  Patient identified by MRN, date of birth, ID band Patient awake    Reviewed: Allergy & Precautions, NPO status , Patient's Chart, lab work & pertinent test results  Airway Mallampati: II  TM Distance: >3 FB Neck ROM: Full    Dental  (+) Teeth Intact, Dental Advisory Given   Pulmonary former smoker,    breath sounds clear to auscultation       Cardiovascular hypertension, + Peripheral Vascular Disease   Rhythm:Regular Rate:Normal     Neuro/Psych PSYCHIATRIC DISORDERS Anxiety Depression    GI/Hepatic Neg liver ROS, GERD  ,  Endo/Other  negative endocrine ROS  Renal/GU negative Renal ROS     Musculoskeletal negative musculoskeletal ROS (+)   Abdominal Normal abdominal exam  (+)   Peds  Hematology negative hematology ROS (+)   Anesthesia Other Findings   Reproductive/Obstetrics                             Anesthesia Physical  Anesthesia Plan  ASA: 3  Anesthesia Plan: General   Post-op Pain Management:    Induction: Intravenous  PONV Risk Score and Plan: 3 and Ondansetron, Dexamethasone and Midazolam  Airway Management Planned: LMA and Oral ETT  Additional Equipment: None  Intra-op Plan:   Post-operative Plan: Extubation in OR  Informed Consent: I have reviewed the patients History and Physical, chart, labs and discussed the procedure including the risks, benefits and alternatives for the proposed anesthesia with the patient or authorized representative who has indicated his/her understanding and acceptance.     Dental advisory given  Plan Discussed with: CRNA  Anesthesia Plan Comments:         Anesthesia Quick Evaluation

## 2021-04-15 NOTE — Progress Notes (Signed)
   04/15/21 1130  Clinical Encounter Type  Visited With Patient  Visit Type Initial  Referral From Nurse  Consult/Referral To Chaplain   Chaplain responded to the consult request for an Advance Directive. Ike Bene provided A.D. education. The patient declined. Patient said he requested a Chaplain to assist with housing. The patient said he needs somewhere to live after discharge. Advise the patient to speak with the unit's CSW about his post discharge concerns. Chaplain prayed with the patient. This note was prepared by Jeanine Luz, M.Div..  For questions please contact by phone [phone].

## 2021-04-15 NOTE — Plan of Care (Signed)
  Problem: Pain Managment: Goal: General experience of comfort will improve Outcome: Progressing   

## 2021-04-15 NOTE — Op Note (Signed)
DATE OF SERVICE: 04/15/2021  PATIENT:  Jose Hansen  50 y.o. male  PRE-OPERATIVE DIAGNOSIS:  left leg atherosclerosis of native arteries causing ischemic rest pain, non-salvageable left lower extremity  POST-OPERATIVE DIAGNOSIS:  Same  PROCEDURE:   Left above knee amputation  SURGEON:  Surgeon(s) and Role:    * Cherre Robins, MD - Primary  ASSISTANT: Risa Grill, PA-C  An assistant was required to facilitate exposure and expedite the case.  ANESTHESIA:   general  EBL: 462mL  BLOOD ADMINISTERED:none  DRAINS: none   LOCAL MEDICATIONS USED:  NONE  SPECIMEN:  residual left lower limb, vascular graft for culture and gram stain  COUNTS: confirmed correct.  TOURNIQUET:  none  PATIENT DISPOSITION:  PACU - hemodynamically stable.   Delay start of Pharmacological VTE agent (>24hrs) due to surgical blood loss or risk of bleeding: no  INDICATION FOR PROCEDURE: Jose Hansen is a 50 y.o. male with nonsalvageable left lower extremity with chronic limb threatening ischemia with ischemic rest pain. After careful discussion of risks, benefits, and alternatives the patient was offered left above-knee amputation. The patient understood and wished to proceed.  OPERATIVE FINDINGS: Very healthy muscle at amputation margin.  Brisk collateral flow throughout the thigh.  Previous vascular graft appeared poorly incorporated.  This was divided as high as possible in the thigh without disrupting the common femoral anastomosis.  The vascular graft was sent for culture and gram stain.  DESCRIPTION OF PROCEDURE: After identification of the patient in the pre-operative holding area, the patient was transferred to the operating room. The patient was positioned supine on the operating room table. Anesthesia was induced. The left leg was prepped and draped in standard fashion. A surgical pause was performed confirming correct patient, procedure, and operative location.  A generous left thigh  fishmouth incision was planned on the left lower extremity with a skin marker. A sterile tourniquet was applied to the proximal thigh. An esmarch tourniquet was used to exsanguinate the leg. The pneumatic tourniquet was inflated. An incision was made as planned. The incision was carried down with a #21 scalpel through the subcutaneous tissue, and through the posterior and anterior compartments of the thigh until the femur was encountered. The periosteum about the femur was elevated as high as possible. The femur was transected with a powered saw. The vascular bundle was found in its typical position, and was suture-ligated proximally using a 2-0 silk suture.  The sciatic nerve was identified in typical position, retracted, transected at the retracted margin, and ligated with a 2-0 silk suture.  The tourniquet was released. Hemostasis was achieved in the surgical bed. The wound was copiously irrigated. A myodesis was performed with 2-0 Vicryl. The wound was closed in layers using O vicryl, 2-0 Vicryl, and surgical stapler.  A clean bandage was applied.  Upon completion of the case instrument and sharps counts were confirmed correct. The patient was transferred to the PACU in good condition. I was present for all portions of the procedure.  [name]. Stanford Breed, MD [facility] Vein Specialists of [facility] Phone Number: [phone] 04/15/2021 4:58 PM

## 2021-04-15 NOTE — Anesthesia Preprocedure Evaluation (Deleted)
Anesthesia Evaluation    Airway Mallampati: II  TM Distance: >3 FB Neck ROM: Full    Dental no notable dental hx.    Pulmonary former smoker,    Pulmonary exam normal breath sounds clear to auscultation       Cardiovascular hypertension, Pt. on medications + Peripheral Vascular Disease  Normal cardiovascular exam Rhythm:Regular Rate:Normal     Neuro/Psych Anxiety Depression    GI/Hepatic GERD  ,  Endo/Other    Renal/GU      Musculoskeletal   Abdominal   Peds  Hematology   Anesthesia Other Findings   Reproductive/Obstetrics                                                             Anesthesia Evaluation  Patient identified by MRN, date of birth, ID band Patient awake    Reviewed: Allergy & Precautions, NPO status , Patient's Chart, lab work & pertinent test results  Airway Mallampati: II  TM Distance: >3 FB Neck ROM: Full    Dental  (+) Teeth Intact, Dental Advisory Given   Pulmonary former smoker,    breath sounds clear to auscultation       Cardiovascular hypertension, + Peripheral Vascular Disease   Rhythm:Regular Rate:Normal     Neuro/Psych PSYCHIATRIC DISORDERS Depression    GI/Hepatic Neg liver ROS, GERD  ,  Endo/Other  negative endocrine ROS  Renal/GU negative Renal ROS     Musculoskeletal negative musculoskeletal ROS (+)   Abdominal Normal abdominal exam  (+)   Peds  Hematology negative hematology ROS (+)   Anesthesia Other Findings   Reproductive/Obstetrics                            Anesthesia Physical Anesthesia Plan  ASA: 3  Anesthesia Plan: General   Post-op Pain Management:    Induction: Intravenous  PONV Risk Score and Plan: 3 and Ondansetron, Dexamethasone and Midazolam  Airway Management Planned: LMA  Additional Equipment: None  Intra-op Plan:   Post-operative Plan: Extubation in  OR  Informed Consent: I have reviewed the patients History and Physical, chart, labs and discussed the procedure including the risks, benefits and alternatives for the proposed anesthesia with the patient or authorized representative who has indicated his/her understanding and acceptance.     Dental advisory given  Plan Discussed with: CRNA  Anesthesia Plan Comments:        Anesthesia Quick Evaluation  Anesthesia Physical Anesthesia Plan  ASA: 3  Anesthesia Plan: General   Post-op Pain Management:    Induction: Intravenous  PONV Risk Score and Plan: 2 and Ondansetron, Midazolam and Treatment may vary due to age or medical condition  Airway Management Planned: LMA and Oral ETT  Additional Equipment:   Intra-op Plan:   Post-operative Plan: Extubation in OR  Informed Consent: I have reviewed the patients History and Physical, chart, labs and discussed the procedure including the risks, benefits and alternatives for the proposed anesthesia with the patient or authorized representative who has indicated his/her understanding and acceptance.     Dental advisory given  Plan Discussed with: CRNA  Anesthesia Plan Comments:  Anesthesia Evaluation  Patient identified by MRN, date of birth, ID band Patient awake    Reviewed: Allergy & Precautions, NPO status , Patient's Chart, lab work & pertinent test results  Airway Mallampati: II  TM Distance: >3 FB Neck ROM: Full    Dental  (+) Teeth Intact, Dental Advisory Given   Pulmonary former smoker,    breath sounds clear to auscultation       Cardiovascular hypertension, + Peripheral Vascular Disease   Rhythm:Regular Rate:Normal     Neuro/Psych PSYCHIATRIC DISORDERS Depression    GI/Hepatic Neg liver ROS, GERD  ,  Endo/Other  negative endocrine ROS  Renal/GU negative Renal ROS     Musculoskeletal negative musculoskeletal ROS (+)    Abdominal Normal abdominal exam  (+)   Peds  Hematology negative hematology ROS (+)   Anesthesia Other Findings   Reproductive/Obstetrics                            Anesthesia Physical Anesthesia Plan  ASA: 3  Anesthesia Plan: General   Post-op Pain Management:    Induction: Intravenous  PONV Risk Score and Plan: 3 and Ondansetron, Dexamethasone and Midazolam  Airway Management Planned: LMA  Additional Equipment: None  Intra-op Plan:   Post-operative Plan: Extubation in OR  Informed Consent: I have reviewed the patients History and Physical, chart, labs and discussed the procedure including the risks, benefits and alternatives for the proposed anesthesia with the patient or authorized representative who has indicated his/her understanding and acceptance.     Dental advisory given  Plan Discussed with: CRNA  Anesthesia Plan Comments:        Anesthesia Quick Evaluation  Anesthesia Quick Evaluation

## 2021-04-15 NOTE — Transfer of Care (Signed)
Immediate Anesthesia Transfer of Care Note  Patient: Jose Hansen  Procedure(s) Performed: LEFT ABOVE KNEE AMPUTATION (Left: Knee)  Patient Location: PACU  Anesthesia Type:General  Level of Consciousness: drowsy and patient cooperative  Airway & Oxygen Therapy: Patient Spontanous Breathing and Patient connected to nasal cannula oxygen  Post-op Assessment: Report given to RN and Post -op Vital signs reviewed and stable  Post vital signs: Reviewed and stable  Last Vitals:  Vitals Value Taken Time  BP    Temp    Pulse 85 04/15/21 1701  Resp 16 04/15/21 1701  SpO2 98 % 04/15/21 1701  Vitals shown include unvalidated device data.  Last Pain:  Vitals:   04/15/21 1657  TempSrc:   PainSc: 0-No pain      Patients Stated Pain Goal: 1 (60/88/83 5844)  Complications: No notable events documented.

## 2021-04-16 ENCOUNTER — Encounter (HOSPITAL_COMMUNITY): Payer: Self-pay | Admitting: Vascular Surgery

## 2021-04-16 DIAGNOSIS — I739 Peripheral vascular disease, unspecified: Secondary | ICD-10-CM | POA: Diagnosis not present

## 2021-04-16 DIAGNOSIS — I998 Other disorder of circulatory system: Secondary | ICD-10-CM | POA: Diagnosis not present

## 2021-04-16 LAB — BASIC METABOLIC PANEL
Anion gap: 10 (ref 5–15)
BUN: 10 mg/dL (ref 6–20)
CO2: 19 mmol/L — ABNORMAL LOW (ref 22–32)
Calcium: 8.8 mg/dL — ABNORMAL LOW (ref 8.9–10.3)
Chloride: 104 mmol/L (ref 98–111)
Creatinine, Ser: 1.32 mg/dL — ABNORMAL HIGH (ref 0.61–1.24)
GFR, Estimated: >60 mL/min (ref >60)
Glucose, Bld: 184 mg/dL — ABNORMAL HIGH (ref 70–99)
Potassium: 4.5 mmol/L (ref 3.5–5.1)
Sodium: 133 mmol/L — ABNORMAL LOW (ref 135–145)

## 2021-04-16 LAB — CBC
HCT: 34 % — ABNORMAL LOW (ref 39.0–52.0)
Hemoglobin: 10.9 g/dL — ABNORMAL LOW (ref 13.0–17.0)
MCH: 26.1 pg (ref 26.0–34.0)
MCHC: 32.1 g/dL (ref 30.0–36.0)
MCV: 81.5 fL (ref 80.0–100.0)
Platelets: 275 10*3/uL (ref 150–400)
RBC: 4.17 MIL/uL — ABNORMAL LOW (ref 4.22–5.81)
RDW: 12.6 % (ref 11.5–15.5)
WBC: 14.7 10*3/uL — ABNORMAL HIGH (ref 4.0–10.5)
nRBC: 0 % (ref 0.0–0.2)

## 2021-04-16 MED ORDER — DIPHENHYDRAMINE HCL 12.5 MG/5ML PO ELIX: 12.5000 mg | ORAL_SOLUTION | Freq: Four times a day (QID) | ORAL | Status: DC | PRN

## 2021-04-16 MED ORDER — HYDROMORPHONE 1 MG/ML IV SOLN
INTRAVENOUS | Status: DC
  Administered 2021-04-16: 2.4 mg via INTRAVENOUS
  Administered 2021-04-17: 0.3 mg via INTRAVENOUS
  Administered 2021-04-17: 0.5 mg via INTRAVENOUS
  Administered 2021-04-17 – 2021-04-18 (×2): 2.1 mg via INTRAVENOUS
  Administered 2021-04-18: 3.6 mg via INTRAVENOUS
  Administered 2021-04-18: 0 mg via INTRAVENOUS
  Administered 2021-04-18: 3 mg via INTRAVENOUS
  Administered 2021-04-18: 2.4 mg via INTRAVENOUS
  Administered 2021-04-19: 3 mg via INTRAVENOUS
  Administered 2021-04-19: 0.3 mg via INTRAVENOUS
  Filled 2021-04-16 (×3): qty 30

## 2021-04-16 MED ORDER — DIPHENHYDRAMINE HCL 50 MG/ML IJ SOLN
12.5000 mg | Freq: Four times a day (QID) | INTRAMUSCULAR | Status: DC | PRN
  Administered 2021-04-16 – 2021-04-18 (×5): 12.5 mg via INTRAVENOUS
  Filled 2021-04-16 (×5): qty 1

## 2021-04-16 MED ORDER — HYDROCODONE-ACETAMINOPHEN 5-325 MG PO TABS: 2.0000 | ORAL_TABLET | Freq: Two times a day (BID) | ORAL | Status: DC | PRN

## 2021-04-16 MED ORDER — ONDANSETRON HCL 4 MG/2ML IJ SOLN: 4.0000 mg | Freq: Four times a day (QID) | INTRAMUSCULAR | Status: DC | PRN

## 2021-04-16 MED ORDER — SODIUM CHLORIDE 0.9% FLUSH: 9.0000 mL | INTRAVENOUS | Status: DC | PRN

## 2021-04-16 MED ORDER — NALOXONE HCL 0.4 MG/ML IJ SOLN: 0.4000 mg | INTRAMUSCULAR | Status: DC | PRN

## 2021-04-16 NOTE — Anesthesia Postprocedure Evaluation (Signed)
Anesthesia Post Note  Patient: Jose Hansen  Procedure(s) Performed: LEFT ABOVE KNEE AMPUTATION (Left: Knee)     Patient location during evaluation: PACU Anesthesia Type: General Level of consciousness: awake and alert Pain management: pain level controlled Vital Signs Assessment: post-procedure vital signs reviewed and stable Respiratory status: spontaneous breathing, nonlabored ventilation, respiratory function stable and patient connected to nasal cannula oxygen Cardiovascular status: blood pressure returned to baseline and stable Postop Assessment: no apparent nausea or vomiting Anesthetic complications: no   No notable events documented.  Last Vitals:  Vitals:   04/16/21 0509 04/16/21 1020  BP: 105/60 111/62  Pulse: (!) 102 90  Resp: 19 18  Temp: 37 C 37 C  SpO2: 98% 100%    Last Pain:  Vitals:   04/16/21 1020  TempSrc: Oral  PainSc:                  [name] S

## 2021-04-16 NOTE — Progress Notes (Signed)
   VASCULAR SURGERY ASSESSMENT & PLAN:   PAD: POD #1 left AKA due to left leg atherosclerosis of native arteries causing ischemic rest pain, non-salvageable left lower extremity. Prior revascularization with thrombosed bypass. Afebrile. VSS. Take down dressing tomorrow.  Acute BLA: no indication for transfusion at this time  SUBJECTIVE:   Pain faily well controlled. Worried about DME and home situation now that he is a double amputee.   PHYSICAL EXAM:   Vitals:   04/15/21 1950 04/15/21 2004 04/16/21 0039 04/16/21 0509  BP: 128/74 137/83 112/66 105/60  Pulse: 97 95 (!) 110 (!) 102  Resp: 16 16 19 19   Temp: 97.6 F (36.4 C) (!) 97.5 F (36.4 C) 98.4 F (36.9 C) 98.6 F (37 C)  TempSrc:  Oral Oral Oral  SpO2: 94%  100% 98%  Weight:    90.1 kg  Height:       General appearance: Awake, alert in no apparent distress Cardiac: Heart rate and rhythm are regular Respirations: Nonlabored Extremities: Surgical dressing site intact with strike-through evident and outlined on ace wrap with ink. This has not spread past the margins.   LABS:   Lab Results  Component Value Date   WBC 14.7 (H) 04/16/2021   HGB 10.9 (L) 04/16/2021   HCT 34.0 (L) 04/16/2021   MCV 81.5 04/16/2021   PLT 275 04/16/2021   Lab Results  Component Value Date   CREATININE 1.32 (H) 04/16/2021   Lab Results  Component Value Date   INR 1.1 04/14/2021   CBG (last 3)  No results for input(s): GLUCAP in the last 72 hours.  PROBLEM LIST:    Active Problems:   Ischemic leg   Peripheral arterial disease (HCC)   CURRENT MEDS:    atorvastatin  40 mg Oral QHS   Chlorhexidine Gluconate Cloth  6 each Topical Once   DULoxetine  60 mg Oral Daily   famotidine  20 mg Oral Daily   fentaNYL (SUBLIMAZE) injection  25 mcg Intramuscular Once   HYDROcodone-acetaminophen  2 tablet Oral Q8H   polyethylene glycol  17 g Oral Daily   pregabalin  100 mg Oral TID   QUEtiapine  150 mg Oral QHS   senna-docusate  1 tablet  Oral QHS   Barbie Banner, PA-C  Office: [phone] 04/16/2021

## 2021-04-16 NOTE — Plan of Care (Signed)
  Problem: Nutrition: Goal: Adequate nutrition will be maintained Outcome: Progressing   Problem: Safety: Goal: Ability to remain free from injury will improve Outcome: Progressing   

## 2021-04-16 NOTE — Progress Notes (Signed)
HD#2 SUBJECTIVE:  Patient Summary: Jose Hansen is a 50 y.o. with a pertinent PMH of HTN, HLD, severe PVD/PAD s/p R AKA and s/p left femoral to posterior tibial artery bypass who presented with left leg pain and admitted for severe PAD and is now s/p L AKA  Overnight Events: No acute events overnight  Interim History: This is hospital day 2 for [place] who was seen and evaluated at the bedside this morning. He reports significant pain still, but says it is bearable with IV pain meds. He also reports significant bleeding from the AKA site and swelling in his left thigh. Has not had a bowel movement since surgery.   OBJECTIVE:  Vital Signs: Vitals:   04/15/21 1950 04/15/21 2004 04/16/21 0039 04/16/21 0509  BP: 128/74 137/83 112/66 105/60  Pulse: 97 95 (!) 110 (!) 102  Resp: 16 16 19 19   Temp: 97.6 F (36.4 C) (!) 97.5 F (36.4 C) 98.4 F (36.9 C) 98.6 F (37 C)  TempSrc:  Oral Oral Oral  SpO2: 94%  100% 98%  Weight:      Height:       Supplemental O2: Room Air SpO2: 98 %  Filed Weights   04/14/21 1850 04/15/21 0457  Weight: 99.8 kg 96.3 kg     Intake/Output Summary (Last 24 hours) at 04/16/2021 0603 Last data filed at 04/15/2021 2231 Gross per 24 hour  Intake 800 ml  Output 2500 ml  Net -1700 ml   Net IO Since Admission: -1,700 mL [04/16/21 0603]  Physical Exam:  General: No acute distress, appears to be in pain. Head: Normocephalic. Atraumatic. CV: RRR. No murmurs, rubs, or gallops. No LE edema Pulmonary: Lungs CTAB. Normal effort.  Abdominal: Soft, nontender, nondistended. Normal bowel sounds. Extremities: S/p R and L AKA. Left AKA dressing is intact, soaked with blood and left stump is severely edematous (~2x size of R stump). No wound vac in place  Skin: Warm and dry. No obvious rash or lesions. Neuro: A&Ox3. No focal deficit. Psych: Normal mood and affect   ASSESSMENT/PLAN:  Assessment: Active Problems:   Ischemic leg   Peripheral  arterial disease (HCC)   Plan: #Peripheral arterial disease #Ischemic left leg s/p left AKA  #S/P Right AKA Patient is s/p R AKA and is now post-op day 1 from his left AKA secondary to left leg atherosclerosis, causing severe ischemic pain of his left lower extremity. Patient previously had revascularization, however, it had thrombosed requiring the patient to undergo left AKA. Patient required significant amount of IV pain meds in the last 24 hrs and given his high tolerance from being on chronic opioid therapy, will switch patient's pain management to PCA pump. Patient has used PCA pump in the past and has done well with this.  - Vascular surgery consulted, appreciate recs - PCA pump for pain management - Scheduled Miralax and Senokot-S - Continue home Lyrica, Seroquel, and Cymbalta - Lipitor 40 mg daily  - Holding Eliquis, will resume once bleeding is stable - Hold off on PT/OT today, consider tomorrow when pain better controlled - Likely will need CIR vs SNR   #Hx of right common fem DVT Resume home eliquis once bleeding is stable.   Best Practice: Diet: Regular diet IVF: Fluids: none VTE: Holding eliquis in setting of L stump bleeding, address tomorrow Code: Full AB: None Therapy Recs: Pending DISPO: Anticipated discharge to  Rehab  pending Medical stability and PT/OT recs .  Signature: Buddy Duty, D.O.  Internal Medicine Resident, PGY-1 [facility]  Pager: [phone] 6:03 AM, 04/16/2021   Please contact the on call pager after 5 pm and on weekends at [phone].

## 2021-04-17 DIAGNOSIS — G8918 Other acute postprocedural pain: Secondary | ICD-10-CM

## 2021-04-17 DIAGNOSIS — I998 Other disorder of circulatory system: Secondary | ICD-10-CM | POA: Diagnosis not present

## 2021-04-17 DIAGNOSIS — Z89612 Acquired absence of left leg above knee: Secondary | ICD-10-CM

## 2021-04-17 LAB — CBC
HCT: 28.3 % — ABNORMAL LOW (ref 39.0–52.0)
HCT: 27.9 % — ABNORMAL LOW (ref 39.0–52.0)
Hemoglobin: 8.8 g/dL — ABNORMAL LOW (ref 13.0–17.0)
Hemoglobin: 8.8 g/dL — ABNORMAL LOW (ref 13.0–17.0)
MCH: 25.8 pg — ABNORMAL LOW (ref 26.0–34.0)
MCH: 26.2 pg (ref 26.0–34.0)
MCHC: 31.1 g/dL (ref 30.0–36.0)
MCHC: 31.5 g/dL (ref 30.0–36.0)
MCV: 83 fL (ref 80.0–100.0)
MCV: 83 fL (ref 80.0–100.0)
Platelets: 241 10*3/uL (ref 150–400)
Platelets: 252 10*3/uL (ref 150–400)
RBC: 3.41 MIL/uL — ABNORMAL LOW (ref 4.22–5.81)
RBC: 3.36 MIL/uL — ABNORMAL LOW (ref 4.22–5.81)
RDW: 13 % (ref 11.5–15.5)
RDW: 13.1 % (ref 11.5–15.5)
WBC: 11.8 10*3/uL — ABNORMAL HIGH (ref 4.0–10.5)
WBC: 11.4 10*3/uL — ABNORMAL HIGH (ref 4.0–10.5)
nRBC: 0 % (ref 0.0–0.2)
nRBC: 0 % (ref 0.0–0.2)

## 2021-04-17 LAB — BASIC METABOLIC PANEL
Anion gap: 6 (ref 5–15)
BUN: 10 mg/dL (ref 6–20)
CO2: 25 mmol/L (ref 22–32)
Calcium: 8.3 mg/dL — ABNORMAL LOW (ref 8.9–10.3)
Chloride: 105 mmol/L (ref 98–111)
Creatinine, Ser: 1.26 mg/dL — ABNORMAL HIGH (ref 0.61–1.24)
GFR, Estimated: >60 mL/min (ref >60)
Glucose, Bld: 135 mg/dL — ABNORMAL HIGH (ref 70–99)
Potassium: 4.1 mmol/L (ref 3.5–5.1)
Sodium: 136 mmol/L (ref 135–145)

## 2021-04-17 LAB — GLUCOSE, CAPILLARY: Glucose-Capillary: 150 mg/dL — ABNORMAL HIGH (ref 70–99)

## 2021-04-17 MED ORDER — PANTOPRAZOLE SODIUM 40 MG PO TBEC
40.0000 mg | DELAYED_RELEASE_TABLET | Freq: Every day | ORAL | Status: DC
  Administered 2021-04-17 – 2021-04-28 (×12): 40 mg via ORAL
  Filled 2021-04-17 (×12): qty 1

## 2021-04-17 MED ORDER — OXYCODONE HCL 5 MG PO TABS
5.0000 mg | ORAL_TABLET | ORAL | Status: DC | PRN
Start: 2021-04-17 — End: 2021-04-20
  Administered 2021-04-17 – 2021-04-19 (×7): 10 mg via ORAL
  Filled 2021-04-17 (×7): qty 2

## 2021-04-17 MED ORDER — IBUPROFEN 400 MG PO TABS
400.0000 mg | ORAL_TABLET | Freq: Four times a day (QID) | ORAL | Status: DC
  Administered 2021-04-17 – 2021-04-28 (×43): 400 mg via ORAL
  Filled 2021-04-17 (×27): qty 1
  Filled 2021-04-17: qty 2
  Filled 2021-04-17 (×17): qty 1

## 2021-04-17 MED ORDER — ACETAMINOPHEN 325 MG PO TABS
650.0000 mg | ORAL_TABLET | ORAL | Status: DC
  Administered 2021-04-17 – 2021-04-19 (×12): 650 mg via ORAL
  Filled 2021-04-17 (×12): qty 2

## 2021-04-17 NOTE — Progress Notes (Signed)
[facility] [place] PROGRESS NOTE  ASSESSMENT / PLAN: Jose Hansen is a 50 y.o. male s/p L AKA 11/17. Incision healing appropriately. Stump edematous. Re-wrapped with ACE / Coban. Increased pain regimen (scheduled tylenol, scheduled motrin, PRN oxycodone).    SUBJECTIVE: Pain poorly controlled on PCA. Stump edematous.   OBJECTIVE: BP 118/66 (BP Location: Right Arm)   Pulse 88   Temp 98.5 F (36.9 C) (Oral)   Resp 17   Ht 5\' 11"  (1.803 m)   Wt 89.5 kg   SpO2 100%   BMI 27.52 kg/m   Intake/Output Summary (Last 24 hours) at 04/17/2021 0819 Last data filed at 04/17/2021 0810 Gross per 24 hour  Intake 1320 ml  Output 900 ml  Net 420 ml    Constitutional: Uncomfortable from pain.  Cardiac: RRR. Pulmonary: unlabored Abdomen: soft Vascular: L AKA stump edematous. Incision healing appropriately.  CBC Latest Ref Rng & Units 04/17/2021 04/16/2021 04/15/2021  WBC 4.0 - 10.5 K/uL 11.8(H) 14.7(H) 7.2  Hemoglobin 13.0 - 17.0 g/dL 8.8(L) 10.9(L) 12.3(L)  Hematocrit 39.0 - 52.0 % 28.3(L) 34.0(L) 39.0  Platelets 150 - 400 K/uL 241 275 293     CMP Latest Ref Rng & Units 04/17/2021 04/16/2021 04/15/2021  Glucose 70 - 99 mg/dL 135(H) 184(H) 84  BUN 6 - 20 mg/dL 10 10 11   Creatinine 0.61 - 1.24 mg/dL 1.26(H) 1.32(H) 1.21  Sodium 135 - 145 mmol/L 136 133(L) 134(L)  Potassium 3.5 - 5.1 mmol/L 4.1 4.5 3.8  Chloride 98 - 111 mmol/L 105 104 102  CO2 22 - 32 mmol/L 25 19(L) 23  Calcium 8.9 - 10.3 mg/dL 8.3(L) 8.8(L) 8.6(L)  Total Protein 6.5 - 8.1 g/dL - - -  Total Bilirubin 0.3 - 1.2 mg/dL - - -  Alkaline Phos 38 - 126 U/L - - -  AST 15 - 41 U/L - - -  ALT 0 - 44 U/L - - -    Estimated Creatinine Clearance: 73.9 mL/min (A) (by C-G formula based on SCr of 1.26 mg/dL (H)).   [name]. Stanford Breed, MD [facility] Phone Number: [phone] 04/17/2021 8:19 AM

## 2021-04-17 NOTE — Progress Notes (Signed)
HD#3 Subjective:  Patient Summary: Jose Hansen is a 50 year old male with PMHx of hypertension, hyperlipidemia, severe peripheral vascular disease s/p right AKA and s/p left femoral to posterior tibial artery bypass admitted for left leg ischemia due to severe peripheral artery disease s/p L AKA.   Overnight Events: No acute overnight events  Interim History: Jose Hansen was evaluated at bedside this morning. He continues to endorse significant pain in his left stump. He notes oozing blood overnight from his stump.   Objective:  Vital signs in last 24 hours: Vitals:   04/16/21 1657 04/16/21 2012 04/17/21 0024 04/17/21 0524  BP: 104/62 118/77  108/68  Pulse: 81 88  81  Resp: 17 18 18 18   Temp: 98.4 F (36.9 C) 98.3 F (36.8 C)  98.2 F (36.8 C)  TempSrc: Oral Oral  Oral  SpO2: 98% 100% 100% 98%  Weight:    89.5 kg  Height:       Supplemental O2: Room Air SpO2: 98 % O2 Flow Rate (L/min): 0 L/min FiO2 (%): (!) 0 %   Physical Exam:  Constitutional: well-appearing middle aged male sitting in bed, in mild distress HENT: normocephalic atraumatic, mucous membranes moist, anicteric sclerae Cardiovascular: regular rate and rhythm, no m/r/g Pulmonary/Chest: normal work of breathing on room air, lungs clear to auscultation bilaterally MSK: R AKA wnl, L BKA stump edematous, significantly tender; Neurological: alert & oriented x 3, no apparent focal deficits  Skin: warm and dry;  incision is clean and dry without signs of infection at this time  Psych: behavior and mood wnl  Filed Weights   04/15/21 0457 04/16/21 0509 04/17/21 0524  Weight: 96.3 kg 90.1 kg 89.5 kg     Intake/Output Summary (Last 24 hours) at 04/17/2021 0702 Last data filed at 04/17/2021 0023 Gross per 24 hour  Intake 960 ml  Output 900 ml  Net 60 ml   Net IO Since Admission: -2,840 mL [04/17/21 0702]  Pertinent Labs: CBC Latest Ref Rng & Units 04/17/2021 04/16/2021 04/15/2021  WBC 4.0 - 10.5  K/uL 11.8(H) 14.7(H) 7.2  Hemoglobin 13.0 - 17.0 g/dL 8.8(L) 10.9(L) 12.3(L)  Hematocrit 39.0 - 52.0 % 28.3(L) 34.0(L) 39.0  Platelets 150 - 400 K/uL 241 275 293    CMP Latest Ref Rng & Units 04/17/2021 04/16/2021 04/15/2021  Glucose 70 - 99 mg/dL 135(H) 184(H) 84  BUN 6 - 20 mg/dL 10 10 11   Creatinine 0.61 - 1.24 mg/dL 1.26(H) 1.32(H) 1.21  Sodium 135 - 145 mmol/L 136 133(L) 134(L)  Potassium 3.5 - 5.1 mmol/L 4.1 4.5 3.8  Chloride 98 - 111 mmol/L 105 104 102  CO2 22 - 32 mmol/L 25 19(L) 23  Calcium 8.9 - 10.3 mg/dL 8.3(L) 8.8(L) 8.6(L)  Total Protein 6.5 - 8.1 g/dL - - -  Total Bilirubin 0.3 - 1.2 mg/dL - - -  Alkaline Phos 38 - 126 U/L - - -  AST 15 - 41 U/L - - -  ALT 0 - 44 U/L - - -    Imaging: No results found.  Assessment/Plan:   Active Problems:   Ischemic leg   Peripheral arterial disease [facility])  Patient Summary: Jose Hansen is a 50 year old male with PMHx of hypertension, hyperlipidemia, severe peripheral vascular disease s/p right AKA and s/p left femoral to posterior tibial artery bypass admitted for left leg ischemia due to severe peripheral artery disease s/p L AKA.    Left leg ischemia s/p L AKA  POD 2 Severe PAD S/p R AKA Patient with history of right AKA presented with ischemia left lower extremity in setting of severe PAD.  Continues to have significant pain in his left stump despite PCA pump.  Stump appears edematous and is significantly tender.  Incision appears to be clean and dry although patient does note having to change his dressing overnight due to oozing blood. -Vascular surgery consulted, appreciate -Continue PCA pump for pain management -Scheduled Tylenol and ibuprofen -Continue home Lyrica and Robaxin -Continue Lipitor 40 mg daily -PT/OT eval tomorrow when pain is better controlled -Will likely need CIR vs SNF placement  Acute blood loss anemia Hemoglobin trending down over past 2 days 12.3>10.9>8.8.  Patient reports ongoing  bleeding from left AKA stump.  Incision appears clean dry, no significant bleeding noted on exam. -Repeat CBC this p.m. -If hemoglobin continues to drop, will require blood transfusion -Trend CBC  Hx of right common femoral DVT Resume Eliquis once anemia stabilized  History of mood disorder - Continue Seroquel 150 daily at bedtime and Cymbalta 60 mg daily  Diet: Normal IVF: None,None VTE: None Code: Full PT/OT recs: Pending. TOC recs: Will likely need SNF placement, Pending PT/OT recs  Prior to Admission Living Arrangement: Home  Anticipated Discharge Location: SNF, pending PT/OT eval Barriers to Discharge: Continued medical management  Dispo: Anticipated discharge to Skilled nursing facility pending clinical improvement and PT/OT recs.   Harvie Heck, MD Internal Medicine Resident PGY-3 Pager# [phone]  Please contact the on call pager after 5 pm and on weekends at [phone].

## 2021-04-18 LAB — CBC
HCT: 27.9 % — ABNORMAL LOW (ref 39.0–52.0)
Hemoglobin: 8.5 g/dL — ABNORMAL LOW (ref 13.0–17.0)
MCH: 25.8 pg — ABNORMAL LOW (ref 26.0–34.0)
MCHC: 30.5 g/dL (ref 30.0–36.0)
MCV: 84.8 fL (ref 80.0–100.0)
Platelets: 248 10*3/uL (ref 150–400)
RBC: 3.29 MIL/uL — ABNORMAL LOW (ref 4.22–5.81)
RDW: 13.2 % (ref 11.5–15.5)
WBC: 8 10*3/uL (ref 4.0–10.5)
nRBC: 0 % (ref 0.0–0.2)

## 2021-04-18 LAB — GLUCOSE, CAPILLARY: Glucose-Capillary: 122 mg/dL — ABNORMAL HIGH (ref 70–99)

## 2021-04-18 MED ORDER — POLYETHYLENE GLYCOL 3350 17 G PO PACK
17.0000 g | PACK | Freq: Two times a day (BID) | ORAL | Status: DC
  Administered 2021-04-18 – 2021-04-19 (×2): 17 g via ORAL
  Filled 2021-04-18 (×17): qty 1

## 2021-04-18 NOTE — Progress Notes (Addendum)
HD#4 Subjective:  Patient Summary: [name] is a 50 year old male with PMHx of hypertension, hyperlipidemia, severe peripheral vascular disease s/p right AKA and s/p left femoral to posterior tibial artery bypass admitted for left leg ischemia due to severe peripheral artery disease s/p L AKA.    Overnight Events: No acute overnight events  Patient is eating breakfast during examination. He appears comfortable and in no acute distress. Endorses persistent pain of left wound. Has not had a BM since admission. He is willing to work with PT/OT today  Objective:  Vital signs in last 24 hours: Vitals:   04/18/21 0437 04/18/21 0500 04/18/21 0726 04/18/21 0756  BP: (!) 101/57   128/60  Pulse: 74   73  Resp: 18  16 19   Temp: (!) 97.4 F (36.3 C)   97.9 F (36.6 C)  TempSrc: Oral   Oral  SpO2: 100%  100% 100%  Weight:  91.8 kg    Height:       Supplemental O2: Room Air SpO2: 100 % O2 Flow Rate (L/min): 2 L/min FiO2 (%): 30 %   Physical Exam:  Physical Exam Constitutional:      General: He is not in acute distress. HENT:     Head: Normocephalic.  Eyes:     General:        Right eye: No discharge.        Left eye: No discharge.     Conjunctiva/sclera: Conjunctivae normal.  Cardiovascular:     Rate and Rhythm: Normal rate and regular rhythm.  Pulmonary:     Effort: Pulmonary effort is normal. No respiratory distress.  Abdominal:     General: Bowel sounds are normal.     Comments: Mild distension. No pain with palpation.   Musculoskeletal:        General: No swelling. Normal range of motion.     Comments: Surgical wound appears clean and non infected. Staples in place. Still very edematous and tender.   Skin:    General: Skin is warm.  Neurological:     General: No focal deficit present.     Mental Status: He is alert.  Psychiatric:        Mood and Affect: Mood normal.        Behavior: Behavior normal.    Filed Weights   04/16/21 0509 04/17/21 0524  04/18/21 0500  Weight: 90.1 kg 89.5 kg 91.8 kg     Intake/Output Summary (Last 24 hours) at 04/18/2021 0853 Last data filed at 04/18/2021 1308 Gross per 24 hour  Intake 1371 ml  Output --  Net 1371 ml   Net IO Since Admission: -1,109 mL [04/18/21 0853]  Pertinent Labs: CBC Latest Ref Rng & Units 04/18/2021 04/17/2021 04/17/2021  WBC 4.0 - 10.5 K/uL 8.0 11.4(H) 11.8(H)  Hemoglobin 13.0 - 17.0 g/dL 8.5(L) 8.8(L) 8.8(L)  Hematocrit 39.0 - 52.0 % 27.9(L) 27.9(L) 28.3(L)  Platelets 150 - 400 K/uL 248 252 241    CMP Latest Ref Rng & Units 04/17/2021 04/16/2021 04/15/2021  Glucose 70 - 99 mg/dL 135(H) 184(H) 84  BUN 6 - 20 mg/dL 10 10 11   Creatinine 0.61 - 1.24 mg/dL 1.26(H) 1.32(H) 1.21  Sodium 135 - 145 mmol/L 136 133(L) 134(L)  Potassium 3.5 - 5.1 mmol/L 4.1 4.5 3.8  Chloride 98 - 111 mmol/L 105 104 102  CO2 22 - 32 mmol/L 25 19(L) 23  Calcium 8.9 - 10.3 mg/dL 8.3(L) 8.8(L) 8.6(L)  Total Protein 6.5 - 8.1  g/dL - - -  Total Bilirubin 0.3 - 1.2 mg/dL - - -  Alkaline Phos 38 - 126 U/L - - -  AST 15 - 41 U/L - - -  ALT 0 - 44 U/L - - -    Imaging: No results found.  Assessment/Plan:   Active Problems:   Ischemic leg   Peripheral arterial disease [facility])   Patient Summary: [name] is a 50 year old male with PMHx of hypertension, hyperlipidemia, severe peripheral vascular disease s/p right AKA and s/p left femoral to posterior tibial artery bypass admitted for left leg ischemia due to severe peripheral artery disease s/p L AKA.    Left leg ischemia s/p L AKA POD 3 Severe PAD Still have persistent pain with PCA pump, motrin and tylenol. Incisions appears clean but stump appears edematous and tender.  -Vascular surgery consulted, appreciate -Continue PCA pump for pain management -Increase bowel regimen -Scheduled Tylenol and ibuprofen -Continue home Lyrica and Robaxin -Continue Lipitor 40 mg daily -PT/OT today -Will likely need CIR vs SNF placement    Acute blood loss anemia Hemoglobin trending down over past 2 days 12.3>10.9>8.8.   Hgb stable at 8.8 today -CBC in AM    Hx of right common femoral DVT Resume Eliquis once anemia stabilized. Likely tomorrow.   History of mood disorder - Continue Seroquel 150 daily at bedtime and Cymbalta 60 mg daily   Diet: Normal IVF: None,None VTE: None Code: Full PT/OT recs: Pending. TOC recs: Will likely need SNF placement, Pending PT/OT recs   Prior to Admission Living Arrangement: Home  Anticipated Discharge Location: SNF, pending PT/OT eval Barriers to Discharge: Continued medical management  Dispo: Anticipated discharge to Skilled nursing facility pending clinical improvement and PT/OT recs.   Gaylan Gerold, DO 04/18/2021, 8:53 AM Pager: [phone]  Please contact the on call pager after 5 pm and on weekends at [phone].

## 2021-04-18 NOTE — Progress Notes (Signed)
[facility] OF [place] PROGRESS NOTE  ASSESSMENT / PLAN: Jose Hansen is a 50 y.o. male s/p L AKA 11/17. Incision healing appropriately. Continue compression / elevation of stump. Continue multi-modal pain control.   SUBJECTIVE: Pain poorly controlled on PCA. Stump edematous.   OBJECTIVE: BP 128/60 (BP Location: Right Arm)   Pulse 73   Temp 97.9 F (36.6 C) (Oral)   Resp 19   Ht 5\' 11"  (1.803 m)   Wt 91.8 kg   SpO2 100%   BMI 28.23 kg/m   Intake/Output Summary (Last 24 hours) at 04/18/2021 1144 Last data filed at 04/18/2021 1000 Gross per 24 hour  Intake 1731 ml  Output 1500 ml  Net 231 ml     Constitutional: Uncomfortable from pain.  Cardiac: RRR. Pulmonary: unlabored Abdomen: soft Vascular: L AKA stump edematous. Incision healing appropriately.  CBC Latest Ref Rng & Units 04/18/2021 04/17/2021 04/17/2021  WBC 4.0 - 10.5 K/uL 8.0 11.4(H) 11.8(H)  Hemoglobin 13.0 - 17.0 g/dL 8.5(L) 8.8(L) 8.8(L)  Hematocrit 39.0 - 52.0 % 27.9(L) 27.9(L) 28.3(L)  Platelets 150 - 400 K/uL 248 252 241     CMP Latest Ref Rng & Units 04/17/2021 04/16/2021 04/15/2021  Glucose 70 - 99 mg/dL 135(H) 184(H) 84  BUN 6 - 20 mg/dL 10 10 11   Creatinine 0.61 - 1.24 mg/dL 1.26(H) 1.32(H) 1.21  Sodium 135 - 145 mmol/L 136 133(L) 134(L)  Potassium 3.5 - 5.1 mmol/L 4.1 4.5 3.8  Chloride 98 - 111 mmol/L 105 104 102  CO2 22 - 32 mmol/L 25 19(L) 23  Calcium 8.9 - 10.3 mg/dL 8.3(L) 8.8(L) 8.6(L)  Total Protein 6.5 - 8.1 g/dL - - -  Total Bilirubin 0.3 - 1.2 mg/dL - - -  Alkaline Phos 38 - 126 U/L - - -  AST 15 - 41 U/L - - -  ALT 0 - 44 U/L - - -    Estimated Creatinine Clearance: 80.3 mL/min (A) (by C-G formula based on SCr of 1.26 mg/dL (H)).   Jose Hansen. Stanford Breed, MD [facility] Phone Number: [phone] 04/18/2021 11:44 AM

## 2021-04-19 ENCOUNTER — Ambulatory Visit: Payer: Medicare Other | Admitting: Licensed Clinical Social Worker

## 2021-04-19 DIAGNOSIS — I739 Peripheral vascular disease, unspecified: Secondary | ICD-10-CM | POA: Diagnosis not present

## 2021-04-19 DIAGNOSIS — Z419 Encounter for procedure for purposes other than remedying health state, unspecified: Secondary | ICD-10-CM

## 2021-04-19 DIAGNOSIS — I998 Other disorder of circulatory system: Secondary | ICD-10-CM | POA: Diagnosis not present

## 2021-04-19 LAB — HEMOGLOBIN AND HEMATOCRIT, BLOOD
HCT: 29.1 % — ABNORMAL LOW (ref 39.0–52.0)
Hemoglobin: 9.1 g/dL — ABNORMAL LOW (ref 13.0–17.0)

## 2021-04-19 LAB — CBC
HCT: 25.1 % — ABNORMAL LOW (ref 39.0–52.0)
Hemoglobin: 7.6 g/dL — ABNORMAL LOW (ref 13.0–17.0)
MCH: 25.9 pg — ABNORMAL LOW (ref 26.0–34.0)
MCHC: 30.3 g/dL (ref 30.0–36.0)
MCV: 85.7 fL (ref 80.0–100.0)
Platelets: 259 10*3/uL (ref 150–400)
RBC: 2.93 MIL/uL — ABNORMAL LOW (ref 4.22–5.81)
RDW: 12.9 % (ref 11.5–15.5)
WBC: 6.8 10*3/uL (ref 4.0–10.5)
nRBC: 0 % (ref 0.0–0.2)

## 2021-04-19 LAB — PREPARE RBC (CROSSMATCH)

## 2021-04-19 MED ORDER — BISACODYL 5 MG PO TBEC
5.0000 mg | DELAYED_RELEASE_TABLET | Freq: Every day | ORAL | Status: DC
  Administered 2021-04-20 – 2021-04-21 (×2): 5 mg via ORAL
  Filled 2021-04-19 (×8): qty 1

## 2021-04-19 MED ORDER — HYDROMORPHONE 1 MG/ML IV SOLN
INTRAVENOUS | Status: DC
  Administered 2021-04-19: 1.8 mg via INTRAVENOUS
  Administered 2021-04-19: 3 mg via INTRAVENOUS
  Administered 2021-04-20: 0.9 mg via INTRAVENOUS
  Administered 2021-04-20: 2.7 mg via INTRAVENOUS
  Administered 2021-04-20: 0.3 mg via INTRAVENOUS

## 2021-04-19 MED ORDER — ONDANSETRON HCL 4 MG/2ML IJ SOLN: 4.0000 mg | Freq: Four times a day (QID) | INTRAMUSCULAR | Status: DC | PRN

## 2021-04-19 MED ORDER — NALOXONE HCL 0.4 MG/ML IJ SOLN: 0.4000 mg | INTRAMUSCULAR | Status: DC | PRN

## 2021-04-19 MED ORDER — SODIUM CHLORIDE 0.9% FLUSH: 9.0000 mL | INTRAVENOUS | Status: DC | PRN

## 2021-04-19 MED ORDER — DIPHENHYDRAMINE HCL 12.5 MG/5ML PO ELIX
12.5000 mg | ORAL_SOLUTION | Freq: Four times a day (QID) | ORAL | Status: DC | PRN
  Administered 2021-04-19: 12.5 mg via ORAL
  Filled 2021-04-19: qty 10

## 2021-04-19 MED ORDER — ACETAMINOPHEN 500 MG PO TABS
1000.0000 mg | ORAL_TABLET | Freq: Three times a day (TID) | ORAL | Status: DC
  Administered 2021-04-19 – 2021-04-28 (×28): 1000 mg via ORAL
  Filled 2021-04-19 (×28): qty 2

## 2021-04-19 MED ORDER — SODIUM CHLORIDE 0.9% IV SOLUTION: Freq: Once | INTRAVENOUS | Status: AC

## 2021-04-19 NOTE — Progress Notes (Addendum)
  Progress Note    04/19/2021 7:57 AM 4 Days Post-Op  Subjective:  continues to have pain in left AKA. Concerned about going home and DME needs   Vitals:   04/19/21 0327 04/19/21 0530  BP:  (!) 107/53  Pulse:  70  Resp: 18 17  Temp:  98.4 F (36.9 C)  SpO2: 98% 96%   Physical Exam: Cardiac:  regular Lungs:  non labored Incisions:  left AKA staple line intact. Healing well. No drainage or fluid collections. L AKA stump edematous.  Abdomen: soft Neurologic: alert and oriented  CBC    Component Value Date/Time   WBC 6.8 04/19/2021 0101   RBC 2.93 (L) 04/19/2021 0101   HGB 7.6 (L) 04/19/2021 0101   HGB 15.4 12/03/2020 1202   HCT 25.1 (L) 04/19/2021 0101   HCT 47.0 12/03/2020 1202   PLT 259 04/19/2021 0101   PLT 288 12/03/2020 1202   MCV 85.7 04/19/2021 0101   MCV 81 12/03/2020 1202   MCH 25.9 (L) 04/19/2021 0101   MCHC 30.3 04/19/2021 0101   RDW 12.9 04/19/2021 0101   RDW 13.5 12/03/2020 1202   LYMPHSABS 1.8 04/14/2021 1314   LYMPHSABS 3.0 12/03/2020 1202   MONOABS 0.6 04/14/2021 1314   EOSABS 0.2 04/14/2021 1314   EOSABS 0.4 12/03/2020 1202   BASOSABS 0.1 04/14/2021 1314   BASOSABS 0.1 12/03/2020 1202    BMET    Component Value Date/Time   NA 136 04/17/2021 0124   NA 145 (H) 12/03/2020 1202   K 4.1 04/17/2021 0124   CL 105 04/17/2021 0124   CO2 25 04/17/2021 0124   GLUCOSE 135 (H) 04/17/2021 0124   BUN 10 04/17/2021 0124   BUN 11 12/03/2020 1202   CREATININE 1.26 (H) 04/17/2021 0124   CALCIUM 8.3 (L) 04/17/2021 0124   GFRNONAA >60 04/17/2021 0124    INR    Component Value Date/Time   INR 1.1 04/14/2021 2124     Intake/Output Summary (Last 24 hours) at 04/19/2021 0757 Last data filed at 04/19/2021 0350 Gross per 24 hour  Intake 1020 ml  Output 2350 ml  Net -1330 ml     Assessment/Plan:  50 y.o. male is s/p Left AKA 4 Days Post-Op   Doing well post op  Left AKA incision line doing well Continue multimodal pain control. Hopefully wean  off of PCA Continue Elevation left AKA Hgb 7.6. Transfuse per primary team PT to evaluate today with recs  Karoline Caldwell, PA-C [facility] [phone] 04/19/2021 7:57 AM  VASCULAR STAFF ADDENDUM: I have independently interviewed and examined the patient. I agree with the above.   [name]. Stanford Breed, MD [facility] Phone Number: [phone] 04/19/2021 11:39 AM

## 2021-04-19 NOTE — Progress Notes (Signed)
Wrong  column 

## 2021-04-19 NOTE — Evaluation (Signed)
Physical Therapy Evaluation Patient Details Name: Jose Hansen MRN: [id number] DOB: January 03, 1971 Today's Date: 04/19/2021  History of Present Illness  Jose Hansen is a 50 yo male with HTN, HLD, severe PVD s/p left femoral to posterior tibial artery bypass with now graft occlusion with no further revascularization options, history of right AKA, and chronic right common femoral DVT on Eliquis who presents to [facility] with acute on chronic left lower extremity pain. The patient is a patient of Dr. Donzetta Matters (vascular surgery) and has been scheduled for a left lower extremity amputation (BKA vs AKA) on 12/3, however, the patient states that his pain has become so severe since his last visit with vascular on 10/26, that he had to come to the ED.   Clinical Impression  Patient received in bed, just receiving unit of blood. Per RN is okay for [place] to work with him. He is independent with bed mobility. Requires set up assist and close supervision for lateral scoot to recliner to his right side. Assist needed to manage lines with mobility.  Patient is pain limited. L residual limb is very painful and swollen. Patient will continue to benefit from skilled PT while here to improve independence with mobility.   He would benefit from a short stay in inpatient rehab to maximize independence. If not approved, he will benefit from STR stay.         Recommendations for follow up therapy are one component of a multi-disciplinary discharge planning process, led by the attending physician.  Recommendations may be updated based on patient status, additional functional criteria and insurance authorization.  Follow Up Recommendations Acute inpatient rehab (3hours/day)    Assistance Recommended at Discharge Intermittent Supervision/Assistance  Functional Status Assessment Patient has had a recent decline in their functional status and demonstrates the ability to make significant improvements in function in a reasonable  and predictable amount of time.  Equipment Recommendations  Other (comment) (power wheelchair)    Recommendations for Other Services Rehab consult     Precautions / Restrictions Precautions Precautions: Fall Restrictions Weight Bearing Restrictions: Yes LLE Weight Bearing: Non weight bearing Other Position/Activity Restrictions: patient has prior R AKA      Mobility  Bed Mobility Overal bed mobility: Modified Independent             General bed mobility comments: patient is able to go from supine to sit with increased effort due to pain.    Transfers Overall transfer level: Needs assistance Equipment used: None Transfers: Bed to chair/wheelchair/BSC            Lateral/Scoot Transfers: Supervision General transfer comment: patient is able to laterally scoot from bed to recliner. Supervision and assist for lines only. Increased pain with this.    Ambulation/Gait               General Gait Details: unable  Stairs            Wheelchair Mobility    Modified Rankin (Stroke Patients Only)       Balance Overall balance assessment: Needs assistance Sitting-balance support: Bilateral upper extremity supported;No upper extremity supported Sitting balance-Leahy Scale: Fair Sitting balance - Comments: supervision with moving only. Static sitting is good.                                     Pertinent Vitals/Pain Pain Assessment: Faces Faces Pain Scale: Hurts whole lot Pain  Location: L residual limb Pain Descriptors / Indicators: Aching;Discomfort;Grimacing;Guarding Pain Intervention(s): Monitored during session;Limited activity within patient's tolerance;Premedicated before session;Repositioned;PCA encouraged;Ice applied    Home Living Family/patient expects to be discharged to:: Shelter/Homeless                   Additional Comments: patient stated he lives alone, but has to figure something out now.    Prior Function  Prior Level of Function : Independent/Modified Independent             Mobility Comments: patient was able to transfer independently prior, used manual wheelchair. ADLs Comments: independent     Hand Dominance   Dominant Hand: Right    Extremity/Trunk Assessment   Upper Extremity Assessment Upper Extremity Assessment: Defer to OT evaluation    Lower Extremity Assessment Lower Extremity Assessment: Generalized weakness    Cervical / Trunk Assessment Cervical / Trunk Assessment: Normal  Communication   Communication: No difficulties  Cognition Arousal/Alertness: Awake/alert Behavior During Therapy: WFL for tasks assessed/performed Overall Cognitive Status: Within Functional Limits for tasks assessed                                         General Comments      Exercises     Assessment/Plan    PT Assessment Patient needs continued PT services  PT Problem List Decreased strength;Decreased mobility;Pain;Decreased activity tolerance;Decreased balance       PT Treatment Interventions DME instruction;Therapeutic activities;Therapeutic exercise;Functional mobility training;Patient/family education;Wheelchair mobility training    PT Goals (Current goals can be found in the Care Plan section)  Acute Rehab PT Goals Patient Stated Goal: to reduce pain and swelling PT Goal Formulation: With patient Time For Goal Achievement: 04/28/21 Potential to Achieve Goals: Good    Frequency Min 3X/week   Barriers to discharge Decreased caregiver support;Inaccessible home environment      Co-evaluation PT/OT/SLP Co-Evaluation/Treatment: Yes Reason for Co-Treatment: For patient/therapist safety;To address functional/ADL transfers PT goals addressed during session: Mobility/safety with mobility;Balance         AM-PAC PT "6 Clicks" Mobility  Outcome Measure Help needed turning from your back to your side while in a flat bed without using bedrails?:  None Help needed moving from lying on your back to sitting on the side of a flat bed without using bedrails?: None Help needed moving to and from a bed to a chair (including a wheelchair)?: A Little Help needed standing up from a chair using your arms (e.g., wheelchair or bedside chair)?: Total Help needed to walk in hospital room?: Total Help needed climbing 3-5 steps with a railing? : Total 6 Click Score: 14    End of Session   Activity Tolerance: Patient limited by pain Patient left: in chair;with chair alarm set;with call bell/phone within reach;Other (comment) (ice pack) Nurse Communication: Mobility status PT Visit Diagnosis: Other abnormalities of gait and mobility (R26.89);Pain Pain - Right/Left: Left Pain - part of body: Leg    Time: 2023-3435 PT Time Calculation (min) (ACUTE ONLY): 20 min   Charges:   PT Evaluation $PT Eval Moderate Complexity: 1 Mod          Renezmae Canlas, PT, GCS 04/19/21,3:19 PM

## 2021-04-19 NOTE — Progress Notes (Signed)
   HD#5 SUBJECTIVE:  Patient Summary: Jose Hansen is a 50 y.o. with a pertinent PMH of HTN, HLD, severe PVD/PAD s/p R AKA and s/p left femoral to posterior tibial artery bypass who presented with left leg pain and admitted for severe PAD and is now s/p L AKA from this admission.  Overnight Events: No acute events overnight  Interim History: This is hospital day 5 for [name] who was seen and evaluated at the bedside this morning. He continues to have pain in his left stump and it remains swollen. Denies any drainage from the surgical site.   OBJECTIVE:  Vital Signs: Vitals:   04/18/21 2348 04/19/21 0006 04/19/21 0327 04/19/21 0530  BP:    (!) 107/53  Pulse:    70  Resp: 18 18 18 17   Temp:    98.4 F (36.9 C)  TempSrc:    Oral  SpO2: 96% 100% 98% 96%  Weight:    94.1 kg  Height:       Supplemental O2: Room Air SpO2: 96 %  Filed Weights   04/17/21 0524 04/18/21 0500 04/19/21 0530  Weight: 89.5 kg 91.8 kg 94.1 kg     Intake/Output Summary (Last 24 hours) at 04/19/2021 0651 Last data filed at 04/18/2021 2108 Gross per 24 hour  Intake 660 ml  Output 2350 ml  Net -1690 ml   Net IO Since Admission: -2,799 mL [04/19/21 0651]  Physical Exam: General: No acute distress. Head: Normocephalic. Atraumatic. CV: RRR. No murmurs, rubs, or gallops.  Pulmonary: Lungs CTAB. Normal effort.  Abdominal: Soft, nontender, nondistended. Normal bowel sounds. Extremities: s/p R AKA (chronic) and s/p L AKA with clean surgical wound and staples in place. Left stump is very edematous and tender on palpation.  Skin: Warm and dry. No obvious rash or lesions. Neuro: A&Ox3. No focal deficit. Psych: Normal mood and affect     ASSESSMENT/PLAN:  Assessment: Active Problems:   Ischemic leg   Peripheral arterial disease (HCC)   Plan: #Left leg ischemia s/p L AKA POD 4 #Severe PAD #S/p R AKA Patient with history of right AKA who presented with ischemia of left lower extremity  in setting of severe PAD. He is post op day 4 following a left AKA with vascular surgery. Continues to have significant pain in his left stump despite PCA pump - Vascular surgery consulted, appreciate recs - Scheduled Tylenol and ibuprofen - Continue home lyrica and robaxin - PCA Pump for pain management - PT/OT eval and treat - Consider CT scan of left stump  - Consider compression stocking for left stump  #Acute blood loss anemia Hb trending down 12.3>10.9>8.8>7.6. Patient is symptomatic from this, endorsing dizziness and lightheadedness. Denies any hematemesis, melena, or abd pain.  - 1u PRBC - Monitor post-transfusion H&H  #Hx of right common femoral DVT Continue to hold Eliquis in the setting of acute blood loss anemia.   Best Practice: Diet: Regular diet IVF: Fluids: none, Rate: None VTE: Held Code: Full AB: None Therapy Recs: Pending DISPO: Anticipated discharge pending Medical stability and and PT/OT recs .  Signature: Buddy Duty, D.O.  Internal Medicine Resident, PGY-1 [facility]  Pager: [phone] 6:51 AM, 04/19/2021   Please contact the on call pager after 5 pm and on weekends at [phone].

## 2021-04-19 NOTE — Chronic Care Management (AMB) (Signed)
  Care Management   Social Work Visit Note  04/19/2021 Name: Jose Hansen MRN: [id number] DOB: April 03, 1971  Jose Hansen is a 50 y.o. year old male who sees [name], MD for primary care. The care management team was consulted for assistance with care management and care coordination needs related to [facility] Resources    Patient was given the following information about care management and care coordination services today, agreed to services, and gave verbal consent: 1.care management/care coordination services include personalized support from designated clinical staff supervised by their physician, including individualized plan of care and coordination with other care providers 2. 24/7 contact phone numbers for assistance for urgent and routine care needs. 3. The patient may stop care management/care coordination services at any time by phone call to the office staff.  Engaged with patient by telephone for follow up visit in response to provider referral for social work chronic care management and care coordination services.  Assessment: Review of patient history, allergies, and health status during evaluation of patient need for care management/care coordination services.    Interventions:  Patient interviewed and appropriate assessments performed Collaborated with clinical team regarding patient needs  Patient currently in hospital. Patient had surgery on 11/17. Patient inquired about electric wheel chair. SW will collaborate with [facility] team.  SDOH (Social Determinants of Health) assessments performed: No     Plan:  patient will work with BSW to address needs related to Level of care concerns  [name], [place] Worker [facility] Care Management  [phone]

## 2021-04-19 NOTE — Progress Notes (Signed)
PT Cancellation Note  Patient Details Name: REI MEDLEN MRN: [id number] DOB: 1971/01/01   Cancelled Treatment:    Reason Eval/Treat Not Completed: Pain limiting ability to participate. Patient reports he is in to much pain right now. Gave him ice packs, will check back later as time allows.     Deone Omahoney 04/19/2021, 11:10 AM

## 2021-04-19 NOTE — Evaluation (Signed)
Occupational Therapy Evaluation Patient Details Name: Jose Hansen MRN: [id number] DOB: 17-Nov-1970 Today's Date: 04/19/2021   History of Present Illness 50 yo male presents to ED on 11/16 with severe acute on chronic left lower extremity pain. Pt was schedule for LLE BKA vs AKA for 12/3 with Dr Donzetta Matters (vascular surgery). S/p L AKA on 11/17. PMH including HTN, HLD, severe PVD s/p left femoral to posterior tibial artery bypass with now graft occlusion with no further revascularization options, right AKA, and chronic right common femoral DVT on Eliquis.   Clinical Impression   PTA, pt was living alone and performing ADLs and transfers to/from w/c. Pt currently requiring Min A for UB ADLs, Min-Mod A for LB ADLs, and Min Guard A for functional transfers. Pt presenting with decreased ROM, balance, and functional performance due to significant pain. Pt is highly motivated despite pain. Pt would benefit from further acute OT to facilitate safe dc. Recommend dc to post-acute rehab for further OT to optimize safety, independence with ADLs, and return to PLOF.      Recommendations for follow up therapy are one component of a multi-disciplinary discharge planning process, led by the attending physician.  Recommendations may be updated based on patient status, additional functional criteria and insurance authorization.   Follow Up Recommendations  Acute inpatient rehab (3hours/day)    Assistance Recommended at Discharge Intermittent Supervision/Assistance  Functional Status Assessment  Patient has had a recent decline in their functional status and demonstrates the ability to make significant improvements in function in a reasonable and predictable amount of time.  Equipment Recommendations  BSC/3in1 (drop arm)    Recommendations for Other Services       Precautions / Restrictions Precautions Precautions: Fall Restrictions Weight Bearing Restrictions: Yes LLE Weight Bearing: Non weight  bearing Other Position/Activity Restrictions: patient has prior R AKA      Mobility Bed Mobility Overal bed mobility: Modified Independent             General bed mobility comments: patient is able to go from supine to sit with increased effort due to pain.    Transfers Overall transfer level: Needs assistance Equipment used: None Transfers: Bed to chair/wheelchair/BSC            Lateral/Scoot Transfers: Supervision General transfer comment: patient is able to laterally scoot from bed to recliner. Supervision and assist for lines only. Increased pain with this.      Balance Overall balance assessment: Needs assistance Sitting-balance support: Bilateral upper extremity supported;No upper extremity supported Sitting balance-Leahy Scale: Fair Sitting balance - Comments: supervision with moving only. Static sitting is good.                                   ADL either performed or assessed with clinical judgement   ADL Overall ADL's : Needs assistance/impaired Eating/Feeding: Set up;Sitting   Grooming: Set up;Sitting   Upper Body Bathing: Minimal assistance;Sitting   Lower Body Bathing: Minimal assistance;Sitting/lateral leans;Bed level   Upper Body Dressing : Minimal assistance;Sitting   Lower Body Dressing: Moderate assistance;Sitting/lateral leans;Bed level   Toilet Transfer: +2 for safety/equipment;Transfer board;Min guard Toilet Transfer Details (indicate cue type and reason): simulated to a drop arm recliner. Min Guard A for safety and balance. increased time for pain         Functional mobility during ADLs: Min guard (lateral scoot) General ADL Comments: Pt motivated despite pain.  Vision         Perception     Praxis      Pertinent Vitals/Pain Pain Assessment: Faces Faces Pain Scale: Hurts whole lot Pain Location: L residual limb Pain Descriptors / Indicators: Aching;Discomfort;Grimacing;Guarding Pain Intervention(s):  Monitored during session;Limited activity within patient's tolerance;Repositioned;Ice applied     Hand Dominance Right   Extremity/Trunk Assessment Upper Extremity Assessment Upper Extremity Assessment: Overall WFL for tasks assessed   Lower Extremity Assessment Lower Extremity Assessment: Defer to PT evaluation;RLE deficits/detail;LLE deficits/detail RLE Deficits / Details: Old R AKA LLE Deficits / Details: New L AKA   Cervical / Trunk Assessment Cervical / Trunk Assessment: Normal   Communication Communication Communication: No difficulties   Cognition Arousal/Alertness: Awake/alert Behavior During Therapy: WFL for tasks assessed/performed Overall Cognitive Status: Within Functional Limits for tasks assessed                                       General Comments  SpO2 97% on RA.    Exercises     Shoulder Instructions      Home Living Family/patient expects to be discharged to:: Private residence Living Arrangements: Alone Available Help at Discharge: Family;Available PRN/intermittently                         Home Equipment: Wheelchair - manual   Additional Comments: Patient stating he lives alone and cannot return to his home becauce of his functional change. With questions about home set up, pt becoming overwhelmed and slightly tearful. Pt reporting he does have good family support; his "aunties"      Prior Functioning/Environment Prior Level of Function : Independent/Modified Independent             Mobility Comments: patient was able to transfer independently prior, used manual wheelchair. ADLs Comments: independent        OT Problem List: Decreased strength;Decreased range of motion;Decreased activity tolerance;Impaired balance (sitting and/or standing);Decreased knowledge of use of DME or AE;Decreased knowledge of precautions;Pain      OT Treatment/Interventions: Self-care/ADL training;Therapeutic exercise;Energy  conservation;DME and/or AE instruction;Therapeutic activities;Patient/family education    OT Goals(Current goals can be found in the care plan section) Acute Rehab OT Goals Patient Stated Goal: Get stronger and figure out the home situation OT Goal Formulation: With patient Time For Goal Achievement: 05/03/21 Potential to Achieve Goals: Good  OT Frequency: Min 3X/week   Barriers to D/C: Inaccessible home environment          Co-evaluation PT/OT/SLP Co-Evaluation/Treatment: Yes Reason for Co-Treatment: For patient/therapist safety;To address functional/ADL transfers PT goals addressed during session: Mobility/safety with mobility;Balance OT goals addressed during session: ADL's and self-care      AM-PAC OT "6 Clicks" Daily Activity     Outcome Measure Help from another person eating meals?: None Help from another person taking care of personal grooming?: A Little Help from another person toileting, which includes using toliet, bedpan, or urinal?: A Little Help from another person bathing (including washing, rinsing, drying)?: A Little Help from another person to put on and taking off regular upper body clothing?: A Little Help from another person to put on and taking off regular lower body clothing?: A Lot 6 Click Score: 18   End of Session Equipment Utilized During Treatment: Oxygen Nurse Communication: Mobility status;Other (comment) (applied ace wrap as shrinker had not yet arrived)  Activity Tolerance: Patient tolerated  treatment well Patient left: in chair;with call bell/phone within reach;with chair alarm set  OT Visit Diagnosis: Unsteadiness on feet (R26.81);Other abnormalities of gait and mobility (R26.89);Muscle weakness (generalized) (M62.81);Pain Pain - Right/Left: Left Pain - part of body: Leg                Time: 8546-2703 OT Time Calculation (min): 19 min Charges:  OT General Charges $OT Visit: 1 Visit OT Evaluation $OT Eval Moderate Complexity: [place], OTR/L Acute Rehab Pager: [phone] Office: [place] 04/19/2021, 3:42 PM

## 2021-04-19 NOTE — Progress Notes (Signed)
Orthopedic Tech Progress Note Patient Details:  Jose Hansen Aug 25, 1970 [id number]  Called in order to [facility]   Patient ID: Jose Hansen, male   DOB: 1970/12/19, 50 y.o.   MRN: [id number]  Jose Hansen 04/19/2021, 12:13 PM

## 2021-04-19 NOTE — Progress Notes (Signed)
Inpatient Rehab Admissions Coordinator:   Pt was screened for CIR per therapy recommendations.  At this time, note pt supervision for transfers and requiring assist for ADLs.  Do not feel pt would require the intensity of CIR at this time since he is already able to complete mobility without any physical assist.  Would recommend f/u at a lower level of care.    Shann Medal, PT, DPT Admissions Coordinator [phone] 04/19/21  5:42 PM

## 2021-04-19 NOTE — Patient Instructions (Signed)
Visit Information  Instructions: patient will work with SW to address concerns related to level of care.  Patient was given the following information about care management and care coordination services today, agreed to services, and gave verbal consent: 1.care management/care coordination services include personalized support from designated clinical staff supervised by their physician, including individualized plan of care and coordination with other care providers 2. 24/7 contact phone numbers for assistance for urgent and routine care needs. 3. The patient may stop care management/care coordination services at any time by phone call to the office staff.  Patient verbalizes understanding of instructions provided today and agrees to view in MyChart.   The care management team will reach out to the patient again over the next 30 days.   Blanca Carreon, BSW  Social Worker [facility] Care Management  [phone]      

## 2021-04-20 DIAGNOSIS — Z89612 Acquired absence of left leg above knee: Secondary | ICD-10-CM

## 2021-04-20 DIAGNOSIS — I998 Other disorder of circulatory system: Secondary | ICD-10-CM | POA: Diagnosis not present

## 2021-04-20 LAB — TYPE AND SCREEN
ABO/RH(D): AB POS
Antibody Screen: NEGATIVE
Unit division: 0

## 2021-04-20 LAB — CBC
HCT: 27.4 % — ABNORMAL LOW (ref 39.0–52.0)
Hemoglobin: 8.6 g/dL — ABNORMAL LOW (ref 13.0–17.0)
MCH: 26.5 pg (ref 26.0–34.0)
MCHC: 31.4 g/dL (ref 30.0–36.0)
MCV: 84.3 fL (ref 80.0–100.0)
Platelets: 290 10*3/uL (ref 150–400)
RBC: 3.25 MIL/uL — ABNORMAL LOW (ref 4.22–5.81)
RDW: 12.7 % (ref 11.5–15.5)
WBC: 6.3 10*3/uL (ref 4.0–10.5)
nRBC: 0 % (ref 0.0–0.2)

## 2021-04-20 LAB — AEROBIC/ANAEROBIC CULTURE W GRAM STAIN (SURGICAL/DEEP WOUND)
Culture: NO GROWTH
Gram Stain: NONE SEEN

## 2021-04-20 LAB — BPAM RBC
Blood Product Expiration Date: 202211282359
ISSUE DATE / TIME: 202211211403
Unit Type and Rh: 6200

## 2021-04-20 MED ORDER — OXYCODONE HCL 5 MG PO TABS: 5.0000 mg | ORAL_TABLET | ORAL | Status: DC | PRN

## 2021-04-20 MED ORDER — DIPHENHYDRAMINE HCL 25 MG PO CAPS: 25.0000 mg | ORAL_CAPSULE | Freq: Every day | ORAL | Status: DC | PRN

## 2021-04-20 MED ORDER — OXYCODONE HCL 5 MG PO TABS
10.0000 mg | ORAL_TABLET | ORAL | Status: DC | PRN
  Administered 2021-04-20 – 2021-04-23 (×13): 10 mg via ORAL
  Filled 2021-04-20 (×14): qty 2

## 2021-04-20 NOTE — [place] FL2 (Signed)
[place] MEDICAID FL2 LEVEL OF CARE SCREENING TOOL     IDENTIFICATION  Patient Name: Jose Hansen Birthdate: [birth year] Sex: male Admission Date (Current Location): 04/14/2021  [facility] and [place] Number:  Herbalist and Address:  [facility] [place]. [facility] [address], [place] [address], [place], [place] [address]      Provider Number: [id number]  Attending Physician Name and Address:  Velna Ochs, MD  Relative Name and Phone Number:       Current Level of Care: Hospital Recommended Level of Care: [place] Prior Approval Number:    Date Approved/Denied:   PASRR Number: pending  Discharge Plan: SNF    Current Diagnoses: Patient Active Problem List   Diagnosis Date Noted   S/P AKA (above knee amputation), left [facility])    Surgery, elective    Peripheral arterial disease ([place]) 04/14/2021   Neuropathic pain    Leg swelling 02/05/2021   Post-op pain    Wound dehiscence    GERD (gastroesophageal reflux disease) 02/04/2021   Ischemic leg 01/14/2021   Hx of AKA (above knee amputation), right ([place]) 01/13/2021   Insomnia 01/08/2021   Right arm pain 12/03/2020   Depression 12/03/2020   Prediabetes 12/03/2020   DVT (deep venous thrombosis) ([place]) 11/24/2020   Essential hypertension 11/24/2020   PVD (peripheral vascular disease) ([place]) 11/24/2020   Hyperlipidemia 11/24/2020    Orientation RESPIRATION BLADDER Height & Weight     Self, Time, Situation, Place  Normal Continent Weight: 207 lb 7.3 oz (94.1 kg) Height:  5\' 11"  (180.3 cm)  BEHAVIORAL SYMPTOMS/MOOD NEUROLOGICAL BOWEL NUTRITION STATUS      Continent Diet  AMBULATORY STATUS COMMUNICATION OF NEEDS Skin   Limited Assist (Wheelchair, doub;e amputee) Verbally Surgical wounds                       Personal Care Assistance Level of Assistance  Feeding, Bathing, Dressing Bathing Assistance: Limited assistance Feeding assistance: Independent Dressing Assistance: Limited  assistance     Functional Limitations Info  Sight, Hearing, Speech Sight Info: Adequate Hearing Info: Adequate Speech Info: Adequate    SPECIAL CARE FACTORS FREQUENCY  PT (By licensed PT), OT (By licensed OT)     PT Frequency: 5x a week OT Frequency: 5x a week            Contractures Contractures Info: Not present    Additional Factors Info  Code Status, Allergies Code Status Info: Full Allergies Info: contrast media           Current Medications (04/20/2021):  This is the current hospital active medication list Current Facility-Administered Medications  Medication Dose Route Frequency Provider Last Rate Last Admin   acetaminophen (TYLENOL) tablet 1,000 mg  1,000 mg Oral Q8H [place], Rayann N, DO   1,000 mg at 04/20/21 0600   atorvastatin (LIPITOR) tablet 40 mg  40 mg Oral QHS Atway, Rayann N, DO   40 mg at 04/19/21 2120   bisacodyl (DULCOLAX) EC tablet 5 mg  5 mg Oral Daily Gaylan Gerold, DO   5 mg at 04/20/21 1030   Chlorhexidine Gluconate Cloth 2 % PADS 6 each  6 each Topical Once Waynetta Sandy, MD       DULoxetine (CYMBALTA) DR capsule 60 mg  60 mg Oral Daily [place], Rayann N, DO   60 mg at 04/20/21 1031   famotidine (PEPCID) tablet 20 mg  20 mg Oral Daily At[place], Rayann N, DO   20 mg at  04/20/21 1030   ibuprofen (ADVIL) tablet 400 mg  400 mg Oral Q6H Cherre Robins, MD   400 mg at 04/20/21 1031   methocarbamol (ROBAXIN) tablet 500 mg  500 mg Oral Q8H PRN Corky Sox, MD   500 mg at 04/20/21 0600   oxyCODONE (Oxy IR/ROXICODONE) immediate release tablet 10 mg  10 mg Oral Q4H PRN At[place], Rayann N, DO   10 mg at 04/20/21 1229   pantoprazole (PROTONIX) EC tablet 40 mg  40 mg Oral Daily Aslam, Sadia, MD   40 mg at 04/20/21 1030   polyethylene glycol (MIRALAX / GLYCOLAX) packet 17 g  17 g Oral BID Gaylan Gerold, DO   17 g at 04/19/21 7517   pregabalin (LYRICA) capsule 100 mg  100 mg Oral TID At[place], Rayann N, DO   100 mg at 04/20/21 1030   QUEtiapine (SEROQUEL)  tablet 150 mg  150 mg Oral QHS Atway, Rayann N, DO   150 mg at 04/19/21 2120   senna-docusate (Senokot-S) tablet 1 tablet  1 tablet Oral QHS Atway, Rayann N, DO   1 tablet at 04/18/21 2031     Discharge Medications: Please see discharge summary for a list of discharge medications.  Relevant Imaging Results:  Relevant Lab Results:   Additional Information [id number]  Emeterio Reeve, LCSW

## 2021-04-20 NOTE — Care Management Important Message (Signed)
Important Message  Patient Details  Name: Jose Hansen MRN: [id number] Date of Birth: 03/08/1971   Medicare Important Message Given:  Yes     Joetta Manners 04/20/2021, 12:03 PM

## 2021-04-20 NOTE — [facility] Notes (Signed)
PT Cancellation Note  Patient Details Name: Jose Hansen MRN: [id number] DOB: 10-26-1970   Cancelled Treatment:    Reason Eval/Treat Not Completed: Pain limiting ability to participate.  Pt reports he has had meds but is too painful to move to a chair. Follow up as time and pt allow.   Ramond Dial 04/20/2021, 3:42 PM  Mee Hives, PT PhD Acute Rehab Dept. Number: [place] and [place]

## 2021-04-20 NOTE — [facility] Notes (Addendum)
[facility] [place]  Subjective  - Pain is the biggest issue.  He also informs me he has no home to return to.    Assessment/Planning: POD # 5 Left AKA with previous right AKA  I discussed wean off PCA today and scheduling PO meds. For every 4 hours.  He agrees to try this today Left AKA edema pending stump shrinker delivery today. He will need social work aide to help with possible social issues of no home.   Objective 140/79 85 98.2 F (36.8 C) (Axillary) 18 98%  Intake/Output Summary (Last 24 hours) at 04/20/2021 0736 Last data filed at 04/19/2021 1655 Gross per 24 hour  Intake 588.67 ml  Output 1525 ml  Net -936.33 ml    Stump warm with incision healing well and no ischemic changes.  Roxy Horseman 04/20/2021 7:36 AM --  Laboratory Lab Results: Recent Labs    04/19/21 0101 04/19/21 1834 04/20/21 0131  WBC 6.8  --  6.3  HGB 7.6* 9.1* 8.6*  HCT 25.1* 29.1* 27.4*  PLT 259  --  290   BMET No results for input(s): NA, K, CL, CO2, GLUCOSE, BUN, CREATININE, CALCIUM in the last 72 hours.  COAG Lab Results  Component Value Date   INR 1.1 04/14/2021   INR 1.0 04/14/2021   INR 1.0 02/05/2021   No results found for: PTT  VASCULAR STAFF ADDENDUM: I have independently interviewed and examined the patient. I agree with the above.   [name]. Stanford Breed, MD [facility] Phone Number: [phone] 04/20/2021 4:44 PM

## 2021-04-20 NOTE — Care Management (Signed)
Dr Raymondo Band requesting electric wheel chair for patient.   NCM spoke to [place] with [facility].  Paperwork for electric wheel chair is "lengthy" and is usually completed by PCP.   Patient's PCP is Dr [name] at Internal Medicine.Phone [phone] and fax [phone].  Dr Raymondo Band requesting paperwork to be sent to PCP.    Dr Humphrey Rolls contact information given to [facility] with [place]. [employer] will send paperwork to Dr Humphrey Rolls.

## 2021-04-20 NOTE — Plan of Care (Signed)

## 2021-04-20 NOTE — [facility] Notes (Signed)
HD#6 SUBJECTIVE:  Patient Summary: Jose Hansen is a 50 y.o. with a pertinent PMH of HTN, HLD, severe PVD/PAD s/p R AKA and s/p left femoral to posterior tibial artery bypass who presented with left leg pain and admitted for severe PAD and is now s/p L AKA from this admission.  Overnight Events: No acute events overnight  Interim History: This is hospital day 6 for Jose Hansen who was seen and evaluated at the bedside this morning. Still is endorsing pain in his left lower extremity, however, it is not any worse than yesterday. Patient worked with PT yesterday and was able to sit in his bedside chair. No acute complaints today.   OBJECTIVE:  Vital Signs: Vitals:   04/19/21 2320 04/20/21 0013 04/20/21 0345 04/20/21 0402  BP: 134/72  140/79   Pulse: 88  85   Resp: 19 17  18   Temp: 98 F (36.7 C)  98.2 F (36.8 C)   TempSrc: Oral  Axillary   SpO2: 99% 98% 99% 98%  Weight:   94.1 kg   Height:       Supplemental O2: Nasal Cannula SpO2: 98 % O2 Flow Rate (L/min): 2 L/min FiO2 (%): 32 %  Filed Weights   04/18/21 0500 04/19/21 0530 04/20/21 0345  Weight: 91.8 kg 94.1 kg 94.1 kg     Intake/Output Summary (Last 24 hours) at 04/20/2021 0626 Last data filed at 04/19/2021 1655 Gross per 24 hour  Intake 588.67 ml  Output 1525 ml  Net -936.33 ml   Net IO Since Admission: -3,375.33 mL [04/20/21 0626]  Physical Exam: General: No acute distress. Head: Normocephalic. Atraumatic. CV: RRR. No murmurs, rubs, or gallops.  Pulmonary: Lungs CTAB. Normal effort. No wheezing or rales. Abdominal: Soft, nontender, nondistended. Normal bowel sounds. Extremities: R AKA (chronic) and s/p L AKA this admission- clean surgical wound with staples in place. L stump very edematous still, although appears to be improving Skin: Warm and dry. No obvious rash or lesions. Neuro: A&Ox3. No focal deficit. Psych: Normal mood and affect   ASSESSMENT/PLAN:  Assessment: Active Problems:    Ischemic leg   Peripheral arterial disease (HCC)   Surgery, elective   Plan: #Left leg ischemia s/p L AKA POD 5 #Severe PAD #S/p R AKA Patient with history of right AKA who presented with ischemia of left lower extremity in setting of severe PAD. He is post op day 5 following a left AKA with vascular surgery. Pain is still present, although it is stable compared to the days prior. Discussed stopping the PCA pump today and will transition to oral pain medications only, for which the patient is agreeable. PT/OT recommend CIR for this patient, however, CIR noted that this patient does not require the intensity of CIR. Will discuss with social work to find SNF placement for this patient. - Discontinue PCA pump today - Oxycodone 10 mg q4h PRN, can increase oral pain meds if needed - Scheduled tylenol 1000mg  q8h and ibuprofen 400 mg q6h - Continue lyrica 100 mg tid - Robaxin 500 mg q8h prn  - Stump shrinker ordered  - PT/OT eval and treat - SNF placement pending  #Acute blood loss anemia Hb improved from 7.6 yesterday to 8.6 today. Denies any further episodes of dizziness or lightheadedness.  - Continue to monitor CBC  #Hx of right common femoral DVT Continue to hold Eliquis in the setting of acute blood loss anemia.    Best Practice: Diet: Regular diet IVF: Fluids: none VTE: Held  in the setting of bleeding Code: Full AB: None Therapy Recs:  SNF vs CIR , DME: other power wheelchair DISPO: Anticipated discharge to Skilled nursing facility pending Medical stability and SNF placement .  Signature: Jose Hansen, D.O.  Internal Medicine Resident, PGY-1 Jose Hansen Internal Medicine Residency  Pager: [phone] 6:26 AM, 04/20/2021   Please contact the on call pager after 5 pm and on weekends at [phone].

## 2021-04-20 NOTE — Social Work (Signed)
Jose Hansen 10-05-1970   Please be advised that the above-named patient will require a short-term nursing home stay - anticipated 30 days or less for rehabilitation and strengthening.  The plan is for return home.

## 2021-04-20 NOTE — Care Management (Signed)
    Durable Medical Equipment  (From admission, onward)           Start     Ordered   04/20/21 1111  For home use only DME Wheelchair electric  Once       Comments: #Left leg ischemia s/p L AKA, Severe PAD, S/p R AKA   Patient suffers from Left leg ischemia s/p L AKA, Severe PAD, S/p R AKA  which impairs their ability to perform daily activities like ambulating  in the home.  A cane  will not resolve issue with performing activities of daily living. A wheelchair will allow patient to safely perform daily activities. Patient can safely propel the wheelchair in the home or has a caregiver who can provide assistance. Length of need lifetime .   04/20/21 1114

## 2021-04-20 NOTE — TOC Initial Note (Addendum)
Transition of Care [facility]) - Initial/Assessment Note    Patient Details  Name: Jose Hansen MRN: [id number] Date of Birth: Jun 14, 1970  Transition of Care [facility]) CM/SW Contact:    Emeterio Reeve, LCSW Phone Number: 04/20/2021, 3:12 PM  Clinical Narrative:                 CSW received SNF consult. CSW met with pt at bedside. CSW introduced self and explained role at the hospital. Pt reports that PTA he was homeless and was able to take car of homself.   CSW reviewed PT/OT recommendations for SNF. Pt reports he needs SNF so he can learn how to get around after losing both legs. Pt reports he needs an eletric wheelchair.  Pt gave CSW permission to fax out to facilities in the area. Pt has no preference of facility at this time. CSW gave pt medicare.gov rating list to review. CSW explained insurance auth process.   Pts Passr number is pending.   CSW will continue to follow.   Expected Discharge Plan: Skilled Nursing Facility Barriers to Discharge: Continued Medical Work up, Ship broker   Patient Goals and CMS Choice Patient states their goals for this hospitalization and ongoing recovery are:: To get better CMS Medicare.gov Compare Post Acute Care list provided to:: Patient Choice offered to / list presented to : Patient  Expected Discharge Plan and Services Expected Discharge Plan: [place]       Living arrangements for the past 2 months: Homeless                                      Prior Living Arrangements/Services Living arrangements for the past 2 months: Homeless Lives with:: Other (Comment) Patient language and need for interpreter reviewed:: Yes Do you feel safe going back to the place where you live?: Yes      Need for Family Participation in Patient Care: Yes (Comment) Care giver support system in place?: Yes (comment)   Criminal Activity/Legal Involvement Pertinent to Current Situation/Hospitalization: No - Comment as  needed  Activities of Daily Living Home Assistive Devices/Equipment: Wheelchair ADL Screening (condition at time of admission) Patient's cognitive ability adequate to safely complete daily activities?: Yes Is the patient deaf or have difficulty hearing?: No Does the patient have difficulty seeing, even when wearing glasses/contacts?: No Does the patient have difficulty concentrating, remembering, or making decisions?: No Patient able to express need for assistance with ADLs?: No Does the patient have difficulty dressing or bathing?: No Independently performs ADLs?: Yes (appropriate for developmental age) Does the patient have difficulty walking or climbing stairs?: Yes Weakness of Legs: Right Weakness of Arms/Hands: None  Permission Sought/Granted   Permission granted to share information with : Yes, Verbal Permission Granted     Permission granted to share info w AGENCY: SNF        Emotional Assessment Appearance:: Appears stated age Attitude/Demeanor/Rapport: Engaged Affect (typically observed): Appropriate Orientation: : Oriented to Self, Oriented to Place, Oriented to  Time, Oriented to Situation Alcohol / Substance Use: Not Applicable Psych Involvement: No (comment)  Admission diagnosis:  Left leg pain [M79.605] Peripheral arterial disease ([place]) [I73.9] Patient Active Problem List   Diagnosis Date Noted   S/P AKA (above knee amputation), left ([place])    Surgery, elective    Peripheral arterial disease ([place]) 04/14/2021   Neuropathic pain    Leg swelling 02/05/2021   Post-op  pain    Wound dehiscence    GERD (gastroesophageal reflux disease) 02/04/2021   Ischemic leg 01/14/2021   Hx of AKA (above knee amputation), right ([place]) 01/13/2021   Insomnia 01/08/2021   Right arm pain 12/03/2020   Depression 12/03/2020   Prediabetes 12/03/2020   DVT (deep venous thrombosis) ([place]) 11/24/2020   Essential hypertension 11/24/2020   PVD (peripheral vascular disease) ([place])  11/24/2020   Hyperlipidemia 11/24/2020   PCP:  [name], MD Pharmacy:   [facility] [address] [place] [address] Phone: [phone] Fax: [phone]     Social Determinants of Health (SDOH) Interventions    Readmission Risk Interventions Readmission Risk Prevention Plan 02/12/2021 02/12/2021 01/26/2021  Transportation Screening Complete Complete Complete  PCP or Specialist Appt within 5-7 Days - Complete Complete  Home Care Screening - Complete Complete  Medication Review (RN CM) - Complete Complete  Some recent data might be hidden    Emeterio Reeve, LCSW Clinical Social Worker

## 2021-04-21 DIAGNOSIS — I739 Peripheral vascular disease, unspecified: Secondary | ICD-10-CM | POA: Diagnosis not present

## 2021-04-21 DIAGNOSIS — Z89612 Acquired absence of left leg above knee: Secondary | ICD-10-CM | POA: Diagnosis not present

## 2021-04-21 LAB — BASIC METABOLIC PANEL
Anion gap: 6 (ref 5–15)
BUN: 9 mg/dL (ref 6–20)
CO2: 26 mmol/L (ref 22–32)
Calcium: 8.5 mg/dL — ABNORMAL LOW (ref 8.9–10.3)
Chloride: 106 mmol/L (ref 98–111)
Creatinine, Ser: 1.08 mg/dL (ref 0.61–1.24)
GFR, Estimated: >60 mL/min (ref >60)
Glucose, Bld: 147 mg/dL — ABNORMAL HIGH (ref 70–99)
Potassium: 3.5 mmol/L (ref 3.5–5.1)
Sodium: 138 mmol/L (ref 135–145)

## 2021-04-21 LAB — CBC
HCT: 28.1 % — ABNORMAL LOW (ref 39.0–52.0)
Hemoglobin: 9.2 g/dL — ABNORMAL LOW (ref 13.0–17.0)
MCH: 26.9 pg (ref 26.0–34.0)
MCHC: 32.7 g/dL (ref 30.0–36.0)
MCV: 82.2 fL (ref 80.0–100.0)
Platelets: 337 10*3/uL (ref 150–400)
RBC: 3.42 MIL/uL — ABNORMAL LOW (ref 4.22–5.81)
RDW: 12.6 % (ref 11.5–15.5)
WBC: 7.3 10*3/uL (ref 4.0–10.5)
nRBC: 0 % (ref 0.0–0.2)

## 2021-04-21 LAB — SURGICAL PATHOLOGY

## 2021-04-21 MED ORDER — RIVAROXABAN 10 MG PO TABS
10.0000 mg | ORAL_TABLET | Freq: Every day | ORAL | Status: DC
  Filled 2021-04-21: qty 1

## 2021-04-21 MED ORDER — APIXABAN 5 MG PO TABS
5.0000 mg | ORAL_TABLET | Freq: Two times a day (BID) | ORAL | Status: DC
  Administered 2021-04-21 – 2021-04-28 (×15): 5 mg via ORAL
  Filled 2021-04-21 (×14): qty 1

## 2021-04-21 MED ORDER — OXYCODONE-ACETAMINOPHEN 7.5-325 MG PO TABS
1.0000 | ORAL_TABLET | ORAL | Status: DC | PRN
  Administered 2021-04-21 (×2): 1 via ORAL
  Filled 2021-04-21 (×2): qty 1

## 2021-04-21 NOTE — [facility] Notes (Signed)
OT Cancellation Note  Patient Details Name: TYDEN KANN MRN: [id number] DOB: [birth year]   Cancelled Treatment:    Reason Eval/Treat Not Completed: Patient declined, continues with pain and swelling despite recent pain medication.  Continue efforts.    Japheth Diekman D Jonas Goh 04/21/2021, 4:54 PM

## 2021-04-21 NOTE — [facility] Notes (Signed)
   HD#7 SUBJECTIVE:  Patient Summary: Jose Hansen is a 50 y.o. with a pertinent PMH of HTN, HLD, severe PVD/PAD s/p R AKA and s/p left femoral to posterior tibial artery bypass who presented with left leg pain and admitted for severe PAD and is now s/p L AKA from this admission.  Overnight Events: No acute events overnight  Interim History: This is hospital day 7 for [name] who was seen and evaluated at the bedside this morning. He reports ongoing significant pain, however, he is satisfied with pain control off the PCA pump and only via oral meds now.   OBJECTIVE:  Vital Signs: Vitals:   04/20/21 0900 04/20/21 1500 04/20/21 1952 04/21/21 0500  BP: (!) 117/52 (!) 177/83 (!) 156/87 125/77  Pulse: 80 69 69 61  Resp: 20 18 18 20   Temp: 98 F (36.7 C) 98 F (36.7 C) 98.3 F (36.8 C) (!) 97.5 F (36.4 C)  TempSrc: Oral Oral Oral Oral  SpO2: 100% 98% 98% 99%  Weight:    92 kg  Height:       Supplemental O2: Room Air SpO2: 99 %  Filed Weights   04/19/21 0530 04/20/21 0345 04/21/21 0500  Weight: 94.1 kg 94.1 kg 92 kg     Intake/Output Summary (Last 24 hours) at 04/21/2021 0646 Last data filed at 04/20/2021 1200 Gross per 24 hour  Intake 437 ml  Output --  Net 437 ml   Net IO Since Admission: -2,938.33 mL [04/21/21 0646]  Physical Exam: General: No acute distress. Head: Normocephalic. Atraumatic. CV: RRR. No murmurs, rubs, or gallops.  Pulmonary: Lungs CTAB. Normal effort.  Abdominal: Soft, nontender, nondistended. Normal bowel sounds. Extremities: R AKA and L AKA. L AKA with clean surgical wound and staples in place. L stump edematous, improving.  Skin: Warm and dry.  Neuro: A&Ox3. No focal deficit. Psych: Normal mood and affect     ASSESSMENT/PLAN:  Assessment: Active Problems:   Ischemic leg   Peripheral arterial disease (HCC)   Surgery, elective   S/P AKA (above knee amputation), left (HCC)   Plan: #Left leg ischemia s/p L AKA POD  6 #Severe PAD #S/p R AKA Patient with history of right AKA who presented with ischemia of left lower extremity in setting of severe PAD. Patient is post op day 6 following L AKA and was stopped off PCA pump yesterday and transitioned to oral only pain medications. Patient is medically stable for discharge, however, discharge is pending disposition planning.  - Oxycodone 10 mg q4h PRN, can increase oral pain meds if needed - Scheduled tylenol 1000mg  q8h and ibuprofen 400 mg q6h - Continue lyrica 100 mg tid - Robaxin 500 mg q8h prn  - Continue stump shrinker, as able - PT/OT eval and treat - SNF placement pending  #Acute blood loss anemia Hb stable at 9.2 today. Patient is s/p 1u PRBC this admission.  - Continue to monitor CBC  #Hx of right common fem DVT Resumed Eliquis 5 mg bid  Best Practice: Diet: Regular diet IVF: Fluids: none VTE: Therpeutic eliquis Code: Full AB: None Therapy Recs:  SNF vs CIR , DME: other electric wheelchair DISPO: Anticipated discharge to Skilled nursing facility pending  placement .  Signature: Buddy Duty, D.O.  Internal Medicine Resident, PGY-1 [facility] Res[facility]  Pager: [phone] 6:46 AM, 04/21/2021   Please contact the on call pager after 5 pm and on weekends at [phone].

## 2021-04-21 NOTE — [facility] Notes (Signed)
PT Cancellation Note  Patient Details Name: SEITH AIKEY MRN: [id number] DOB: [birth year]   Cancelled Treatment:    Reason Eval/Treat Not Completed: Pain limiting ability to participate;Patient declined, no reason specified (Pt. reports having severe pain and declines therapy, NSG unable to give pt more pain meds at this time. Pt. encouraged to transfer to chair later today if pain is under control)  Adelaido Nicklaus A. Yoshio Seliga, PT, DPT Acute Rehabilitation Services Office: [place] 04/21/2021, 11:44 AM

## 2021-04-21 NOTE — TOC Pro[facility] Note (Signed)
Transition of Care [facility]) - Progression Note    Patient Details  Name: NITISH ROES MRN: [id number] Date of Birth: 08/15/1970  [facility]) CM/SW Contact  Emeterio Reeve, [place] Phone Number: 04/21/2021, 12:12 PM  Clinical Narrative:     CSW gave pt bed offers. Pt now has a passr number [phone].  Expected Discharge Plan: [place] Barriers to Discharge: Continued Medical Work up, Ship broker  Expected Discharge Plan and Services Expected Discharge Plan: [place]       Living arrangements for the past 2 months: Homeless                                       Social Determinants of Health ([place]) Interventions    Readmission Risk Interventions Readmission Risk Prevention Plan 02/12/2021 02/12/2021 01/26/2021  Transportation Screening Complete Complete Complete  PCP or Specialist Appt within 5-7 Days - Complete Complete  Home Care Screening - Complete Complete  Medication Review (RN CM) - Complete Complete  Some recent data might be hidden   Emeterio Reeve, LCSW Clinical Social Worker

## 2021-04-22 LAB — CBC
HCT: 29.7 % — ABNORMAL LOW (ref 39.0–52.0)
Hemoglobin: 9.6 g/dL — ABNORMAL LOW (ref 13.0–17.0)
MCH: 26.6 pg (ref 26.0–34.0)
MCHC: 32.3 g/dL (ref 30.0–36.0)
MCV: 82.3 fL (ref 80.0–100.0)
Platelets: 330 10*3/uL (ref 150–400)
RBC: 3.61 MIL/uL — ABNORMAL LOW (ref 4.22–5.81)
RDW: 12.6 % (ref 11.5–15.5)
WBC: 7.5 10*3/uL (ref 4.0–10.5)
nRBC: 0 % (ref 0.0–0.2)

## 2021-04-22 MED ORDER — METHOCARBAMOL 750 MG PO TABS
750.0000 mg | ORAL_TABLET | Freq: Three times a day (TID) | ORAL | Status: DC
  Administered 2021-04-22 – 2021-04-23 (×3): 750 mg via ORAL
  Filled 2021-04-22 (×3): qty 1

## 2021-04-22 MED ORDER — HYDROMORPHONE HCL 1 MG/ML IJ SOLN
0.5000 mg | Freq: Four times a day (QID) | INTRAMUSCULAR | Status: DC | PRN
  Administered 2021-04-22 – 2021-04-23 (×4): 0.5 mg via INTRAVENOUS
  Filled 2021-04-22 (×4): qty 0.5

## 2021-04-22 NOTE — Progress Notes (Signed)
   HD#8 SUBJECTIVE:  Patient Summary: Jose Hansen is a 50 y.o. with a pertinent PMH of HTN, HLD, severe PVD/PAD s/p R AKA who presented with acute on chronic left leg pain and admitted for severe PAD and is now s/p L AKA from this admission.   Overnight Events: No acute events overnight  Interim History: This is hospital day 8 for Jose Hansen who was seen and evaluated at the bedside this morning. He has no acute complaints today, but notes that his pain is not improved. Patient is displeased with SNF offers at this time.   OBJECTIVE:  Vital Signs: Vitals:   04/21/21 0803 04/21/21 1638 04/21/21 2152 04/22/21 0457  BP: 128/76 (!) 143/76 (!) 171/91 118/72  Pulse: 64 68 66 60  Resp: 18 18  18   Temp: 98 F (36.7 C) 98.7 F (37.1 C) 98.7 F (37.1 C) 97.6 F (36.4 C)  TempSrc: Oral Oral Oral Oral  SpO2: 99% 100% 100% 100%  Weight:    92 kg  Height:       Supplemental O2: Room Air SpO2: 100 %   Filed Weights   04/20/21 0345 04/21/21 0500 04/22/21 0457  Weight: 94.1 kg 92 kg 92 kg     Intake/Output Summary (Last 24 hours) at 04/22/2021 0613 Last data filed at 04/22/2021 0500 Gross per 24 hour  Intake 994 ml  Output 1000 ml  Net -6 ml   Net IO Since Admission: -2,944.33 mL [04/22/21 0613]  Physical Exam: General: No acute distress. Head: Normocephalic. Atraumatic. CV: RRR. No murmurs, rubs, or gallops.  Pulmonary: Lungs CTAB. Normal effort. No wheezing or rales. Abdominal: Soft, nontender, nondistended. Normal bowel sounds. Extremities: Palpable femoral pulses. R AKA and newly s/p L AKA with clean surgical wound and staples intact. L stump edematous, not erythematous.  Skin: Warm and dry. No obvious rash or lesions. Neuro: A&Ox3. No focal deficit. Psych: Normal mood and affect    ASSESSMENT/PLAN:  Assessment: Active Problems:   Ischemic leg   Peripheral arterial disease (HCC)   Surgery, elective   S/P AKA (above knee amputation), left  (HCC)   Plan: #Left leg ischemia s/p L AKA POD 7 #Severe PAD #S/p R AKA Patient with history of right AKA who presented with ischemia of left lower extremity in setting of severe PAD. Patient is post op day 7 following L AKA. Patient is medically stable and has 3 SNF offers, with placement pending. He is displeased with SNF offers and would like to pursue other SNF availability.  - Continue oxycodone 10 mg q4h prn  - Dilaudid 0.5 mg q6h prn for breakthrough pain  - Tylenol 1000 mg tid + ibuprofen 400 mg q6h - Robaxin 750 mg tid  - Lyrica 100 mg tid - Continue lipitor 40 mg  #Acute blood loss anemia Hb stable at 9.6 this morning. He is s/p 1u PRBC this admission on 04/19/21. No active signs of bleeding.   #Hx of R common fem DVT Continue Eliquis 5 mg BID.  Best Practice: Diet: Regular diet IVF: Fluids: none VTE: Eliquis Code: Full AB: None Therapy Recs: SNF, DME: other electric wheelchair DISPO: Anticipated discharge to Skilled nursing facility pending  placement .  Signature: Buddy Duty, D.O.  Internal Medicine Resident, PGY-1 [facility]  Pager: [phone] 6:13 AM, 04/22/2021   Please contact the on call pager after 5 pm and on weekends at [phone].

## 2021-04-22 NOTE — Plan of Care (Signed)
  Problem: Pain Managment: Goal: General experience of comfort will improve Outcome: Not Progressing   Problem: Nutrition: Goal: Adequate nutrition will be maintained Outcome: Progressing

## 2021-04-23 DIAGNOSIS — I739 Peripheral vascular disease, unspecified: Secondary | ICD-10-CM | POA: Diagnosis not present

## 2021-04-23 DIAGNOSIS — G8918 Other acute postprocedural pain: Secondary | ICD-10-CM

## 2021-04-23 DIAGNOSIS — Z89612 Acquired absence of left leg above knee: Secondary | ICD-10-CM | POA: Diagnosis not present

## 2021-04-23 DIAGNOSIS — I998 Other disorder of circulatory system: Secondary | ICD-10-CM | POA: Diagnosis not present

## 2021-04-23 LAB — GLUCOSE, CAPILLARY
Glucose-Capillary: 121 mg/dL — ABNORMAL HIGH (ref 70–99)
Glucose-Capillary: 97 mg/dL (ref 70–99)

## 2021-04-23 MED ORDER — HYDROMORPHONE HCL 1 MG/ML IJ SOLN
0.5000 mg | Freq: Four times a day (QID) | INTRAMUSCULAR | Status: DC | PRN
  Administered 2021-04-23 – 2021-04-24 (×2): 0.5 mg via INTRAVENOUS
  Filled 2021-04-23: qty 0.5

## 2021-04-23 MED ORDER — HYDROMORPHONE HCL 1 MG/ML IJ SOLN: 0.5000 mg | Freq: Four times a day (QID) | INTRAMUSCULAR | Status: DC | PRN

## 2021-04-23 MED ORDER — HYDROMORPHONE HCL 1 MG/ML IJ SOLN
0.5000 mg | Freq: Two times a day (BID) | INTRAMUSCULAR | Status: DC | PRN
  Administered 2021-04-23: 0.5 mg via INTRAVENOUS
  Filled 2021-04-23 (×2): qty 0.5

## 2021-04-23 MED ORDER — HYDROMORPHONE HCL 1 MG/ML IJ SOLN: 0.5000 mg | Freq: Three times a day (TID) | INTRAMUSCULAR | Status: DC | PRN

## 2021-04-23 MED ORDER — OXYCODONE HCL ER 15 MG PO T12A
15.0000 mg | EXTENDED_RELEASE_TABLET | Freq: Two times a day (BID) | ORAL | Status: DC
  Administered 2021-04-23 (×2): 15 mg via ORAL
  Filled 2021-04-23 (×2): qty 1

## 2021-04-23 MED ORDER — OXYCODONE HCL 5 MG PO TABS
10.0000 mg | ORAL_TABLET | Freq: Four times a day (QID) | ORAL | Status: DC | PRN
  Administered 2021-04-23: 10 mg via ORAL
  Filled 2021-04-23: qty 2

## 2021-04-23 MED ORDER — OXYCODONE HCL 5 MG PO TABS
10.0000 mg | ORAL_TABLET | ORAL | Status: DC | PRN
  Administered 2021-04-23 – 2021-04-25 (×5): 10 mg via ORAL
  Filled 2021-04-23 (×6): qty 2

## 2021-04-23 MED ORDER — METHOCARBAMOL 500 MG PO TABS
1000.0000 mg | ORAL_TABLET | Freq: Three times a day (TID) | ORAL | Status: DC
  Administered 2021-04-23 – 2021-04-28 (×16): 1000 mg via ORAL
  Filled 2021-04-23 (×16): qty 2

## 2021-04-23 NOTE — Progress Notes (Signed)
Physical Therapy Treatment Patient Details Name: Jose Hansen MRN: [id number] DOB: [birth year] Today's Date: 04/23/2021   History of Present Illness 50 yo male presents to ED on 11/16 with severe acute on chronic left lower extremity pain. Pt was schedule for LLE BKA vs AKA for 12/3 with Dr Donzetta Matters (vascular surgery). S/p L AKA on 11/17. PMH including HTN, HLD, severe PVD s/p left femoral to posterior tibial artery bypass with now graft occlusion with no further revascularization options, right AKA, and chronic right common femoral DVT on Eliquis.    PT Comments    Pt was seen to work on control of sitting balance and declined ex over pain on LLE.  With meds could get to the chair, but also talked with him about the inability to return to bed alone.  Pt stated he had been up in wheelchair before and felt he could not get back to bed, but did not immediately recall instructions to ask nursing to assist him.  Follow up as pt will allow, with move to wheelchair next visit if pt will permit.  He is fearful of causing more pain on RLE and will plan to have pain meds given again before the visit to see if he can tolerate wheelchair mobility.  Follow for goals of acute PT.  Recommendations for follow up therapy are one component of a multi-disciplinary discharge planning process, led by the attending physician.  Recommendations may be updated based on patient status, additional functional criteria and insurance authorization.  Follow Up Recommendations  Acute inpatient rehab (3hours/day)     Assistance Recommended at Discharge Intermittent Supervision/Assistance  Equipment Recommendations       Recommendations for Other Services Rehab consult     Precautions / Restrictions Precautions Precautions: Fall Precaution Comments: two person availability to transfer safely Restrictions Weight Bearing Restrictions: Yes LLE Weight Bearing: Non weight bearing Other Position/Activity Restrictions:  patient has prior R AKA     Mobility  Bed Mobility Overal bed mobility: Modified Independent             General bed mobility comments: goes to scoot backward to chair without help    Transfers Overall transfer level: Needs assistance Equipment used: None Transfers: Bed to chair/wheelchair/BSC         Anterior-Posterior transfers: Min guard  Lateral/Scoot Transfers: Supervision      Ambulation/Gait                   Stairs             Wheelchair Mobility    Modified Rankin (Stroke Patients Only)       Balance Overall balance assessment: Needs assistance Sitting-balance support: Bilateral upper extremity supported Sitting balance-Leahy Scale: Fair                                      Cognition Arousal/Alertness: Awake/alert Behavior During Therapy: WFL for tasks assessed/performed Overall Cognitive Status: Within Functional Limits for tasks assessed                                 General Comments: pt is joking with CNA and PT        Exercises      General Comments General comments (skin integrity, edema, etc.): pt is painful and sensitive on LLE but with careful transfer to chair and  replacement of protective equipement with pillows, able to be up without extreme symptoms      Pertinent Vitals/Pain Pain Assessment: Faces Faces Pain Scale: Hurts little more Pain Location: L residual limb Pain Descriptors / Indicators: Grimacing;Guarding Pain Intervention(s): Monitored during session;Premedicated before session;Repositioned    Home Living                          Prior Function            PT Goals (current goals can now be found in the care plan section) Acute Rehab PT Goals Patient Stated Goal: to reduce pain Progress towards PT goals: Progressing toward goals    Frequency    Min 3X/week      PT Plan Current plan remains appropriate    Co-evaluation               AM-PAC PT "6 Clicks" Mobility   Outcome Measure  Help needed turning from your back to your side while in a flat bed without using bedrails?: None Help needed moving from lying on your back to sitting on the side of a flat bed without using bedrails?: A Little Help needed moving to and from a bed to a chair (including a wheelchair)?: A Little Help needed standing up from a chair using your arms (e.g., wheelchair or bedside chair)?: Total Help needed to walk in hospital room?: Total Help needed climbing 3-5 steps with a railing? : Total 6 Click Score: 13    End of Session Equipment Utilized During Treatment: Gait belt Activity Tolerance: Patient limited by pain Patient left: in chair;with call bell/phone within reach;with chair alarm set;with nursing/sitter in room Nurse Communication: Mobility status PT Visit Diagnosis: Other abnormalities of gait and mobility (R26.89);Pain Pain - Right/Left: Left Pain - part of body: Leg     Time: 1610-1630 PT Time Calculation (min) (ACUTE ONLY): 20 min  Charges:  $Therapeutic Activity: 8-22 mins             Ramond Dial 04/23/2021, 7:05 PM  Mee Hives, PT PhD Acute Rehab Dept. Number: [place] and [place]

## 2021-04-23 NOTE — Plan of Care (Signed)
  Problem: Nutrition: Goal: Adequate nutrition will be maintained Outcome: Progressing   Problem: Safety: Goal: Ability to remain free from injury will improve Outcome: Progressing   Problem: Skin Integrity: Goal: Risk for impaired skin integrity will decrease Outcome: Progressing   

## 2021-04-23 NOTE — Progress Notes (Signed)
   HD#9 SUBJECTIVE:  Patient Summary: Jose Hansen is a 50 y.o. with a pertinent PMH of HTN, HLD, severe PVD/PAD s/p R AKA who presented with acute on chronic left leg pain and admitted for severe PAD and is now s/p L AKA from this admission.   Overnight Events: No acute events overnight  Interim History: This is hospital day 9 for Jose Hansen who was seen and evaluated at the bedside this morning. He is still having significant pain in his left AKA, no significant change compared to yesterday. Describes the pain as a shock like sensation. Has been having regular bowel movements. No acute complaints today.  OBJECTIVE:  Vital Signs: Vitals:   04/22/21 0806 04/22/21 1643 04/22/21 2000 04/23/21 0447  BP: 100/60 (!) 136/91 (!) 157/88 120/64  Pulse: 63 65 70 65  Resp: 19 18 20 18   Temp: 97.9 F (36.6 C) 98.3 F (36.8 C) 98.1 F (36.7 C) 97.9 F (36.6 C)  TempSrc: Oral Oral Oral Oral  SpO2: 98% 99% 98% 99%  Weight:    90.2 kg  Height:       Supplemental O2: Room Air SpO2: 99 %  Filed Weights   04/21/21 0500 04/22/21 0457 04/23/21 0447  Weight: 92 kg 92 kg 90.2 kg     Intake/Output Summary (Last 24 hours) at 04/23/2021 4580 Last data filed at 04/23/2021 0451 Gross per 24 hour  Intake 1100 ml  Output 700 ml  Net 400 ml   Net IO Since Admission: -2,544.33 mL [04/23/21 0712]  Physical Exam: General: No acute distress. Head: Normocephalic. Atraumatic. CV: RRR. No murmurs, rubs, or gallops.  Pulmonary: Lungs CTAB. Normal effort.  Abdominal: Soft, nontender, nondistended. Normal bowel sounds. Extremities: S/p R AKA and L AKA. L AKA with staples intact, edematous stump.  Skin: Warm and dry. No obvious rash or lesions. Neuro: A&Ox3. No focal deficit. Psych: Normal mood and affect    ASSESSMENT/PLAN:  Assessment: Active Problems:   Ischemic leg   Peripheral arterial disease (HCC)   Surgery, elective   S/P AKA (above knee amputation), left  (HCC)   Plan: #Left leg ischemia s/p L AKA POD 8 #Severe PAD #S/p R AKA Patient with history of right AKA who presented with ischemia of left lower extremity in setting of severe PAD. Patient is post op day 8 following L AKA. Patient is still in a sigificant amount of pain following his surgery. Overall, patient remains medically stable for discharge and has been pending SNF placement, although patient is displeased with SNF choices at this time.  - Start oxycontin 15 mg bid scheduled + oxycodone 10 mg q6h prn + dilaudid 0.5 mg bid prn, goal to wean off dilaudid   - Robaxin 1000 mg tid + tylenol 1000 mg tid + ibuprofen 400 mg q6h - Lyrica 100 mg tid - Lipitor 40 mg   #Hx of R common fem DVT Continue Eliquis 5 mg BID.    Best Practice: Diet: Regular diet IVF: Fluids: none VTE: Eliquis Code: Full AB: None Therapy Recs: SNF, DME: wheelchair DISPO: Anticipated discharge to Skilled nursing facility pending  placement .  Signature: Buddy Duty, D.O.  Internal Medicine Resident, PGY-1 [facility]  Pager: [phone] 7:12 AM, 04/23/2021   Please contact the on call pager after 5 pm and on weekends at [phone].

## 2021-04-23 NOTE — Progress Notes (Signed)
  Progress Note    04/23/2021 8:02 AM 8 Days Post-Op  Subjective:  still having pain in left AKA, says it is getting more manageable   Vitals:   04/22/21 2000 04/23/21 0447  BP: (!) 157/88 120/64  Pulse: 70 65  Resp: 20 18  Temp: 98.1 F (36.7 C) 97.9 F (36.6 C)  SpO2: 98% 99%   Physical Exam: Cardiac:  regular Lungs:  non labored Incisions:  left AKA well appearing, staples intact. Viable flaps. Edematous thigh but no appreciable fluid collections or hematoma   Extremities: well perfused and warm Neurologic: alert and oriented  CBC    Component Value Date/Time   WBC 7.5 04/22/2021 0056   RBC 3.61 (L) 04/22/2021 0056   HGB 9.6 (L) 04/22/2021 0056   HGB 15.4 12/03/2020 1202   HCT 29.7 (L) 04/22/2021 0056   HCT 47.0 12/03/2020 1202   PLT 330 04/22/2021 0056   PLT 288 12/03/2020 1202   MCV 82.3 04/22/2021 0056   MCV 81 12/03/2020 1202   MCH 26.6 04/22/2021 0056   MCHC 32.3 04/22/2021 0056   RDW 12.6 04/22/2021 0056   RDW 13.5 12/03/2020 1202   LYMPHSABS 1.8 04/14/2021 1314   LYMPHSABS 3.0 12/03/2020 1202   MONOABS 0.6 04/14/2021 1314   EOSABS 0.2 04/14/2021 1314   EOSABS 0.4 12/03/2020 1202   BASOSABS 0.1 04/14/2021 1314   BASOSABS 0.1 12/03/2020 1202    BMET    Component Value Date/Time   NA 138 04/21/2021 0119   NA 145 (H) 12/03/2020 1202   K 3.5 04/21/2021 0119   CL 106 04/21/2021 0119   CO2 26 04/21/2021 0119   GLUCOSE 147 (H) 04/21/2021 0119   BUN 9 04/21/2021 0119   BUN 11 12/03/2020 1202   CREATININE 1.08 04/21/2021 0119   CALCIUM 8.5 (L) 04/21/2021 0119   GFRNONAA >60 04/21/2021 0119    INR    Component Value Date/Time   INR 1.1 04/14/2021 2124     Intake/Output Summary (Last 24 hours) at 04/23/2021 0802 Last data filed at 04/23/2021 0451 Gross per 24 hour  Intake 1100 ml  Output 700 ml  Net 400 ml     Assessment/Plan:  50 y.o. male is s/p left AKA 8 Days Post-Op   Left AKA well appearing Left thigh remains edematous.  Elevate PRN Pain control as needed TOC assisting in placement  Will have post op appointment arranged for staple removal in 3 weeks   Karoline Caldwell, PA-C [facility] [phone] 04/23/2021 8:02 AM

## 2021-04-23 NOTE — TOC Progression Note (Signed)
Transition of Care [facility]) - Progression Note    Patient Details  Name: Jose Hansen MRN: [id number] Date of Birth: [birth year]  [facility] CM/SW Contact  Emeterio Reeve, [place] Phone Number: 04/23/2021, 4:13 PM  Clinical Narrative:     Pt chose [facility] for SNF. CSW reached out to [facility] and is awaiting for response. MD states may be ready on Monday pending pain control.   Expected Discharge Plan: [place] Barriers to Discharge: Continued Medical Work up, Ship broker  Expected Discharge Plan and Services Expected Discharge Plan: [place]       Living arrangements for the past 2 months: Homeless                                       Social Determinants of Health ([place]) Interventions    Readmission Risk Interventions Readmission Risk Prevention Plan 02/12/2021 02/12/2021 01/26/2021  Transportation Screening Complete Complete Complete  PCP or Specialist Appt within 5-7 Days - Complete Complete  Home Care Screening - Complete Complete  Medication Review (RN CM) - Complete Complete  Some recent data might be hidden   Emeterio Reeve, LCSW Clinical Social Worker

## 2021-04-24 LAB — BASIC METABOLIC PANEL
Anion gap: 7 (ref 5–15)
BUN: 10 mg/dL (ref 6–20)
CO2: 23 mmol/L (ref 22–32)
Calcium: 8.4 mg/dL — ABNORMAL LOW (ref 8.9–10.3)
Chloride: 106 mmol/L (ref 98–111)
Creatinine, Ser: 1.18 mg/dL (ref 0.61–1.24)
GFR, Estimated: >60 mL/min (ref >60)
Glucose, Bld: 127 mg/dL — ABNORMAL HIGH (ref 70–99)
Potassium: 3.7 mmol/L (ref 3.5–5.1)
Sodium: 136 mmol/L (ref 135–145)

## 2021-04-24 LAB — CBC
HCT: 28.8 % — ABNORMAL LOW (ref 39.0–52.0)
Hemoglobin: 9.3 g/dL — ABNORMAL LOW (ref 13.0–17.0)
MCH: 26.6 pg (ref 26.0–34.0)
MCHC: 32.3 g/dL (ref 30.0–36.0)
MCV: 82.5 fL (ref 80.0–100.0)
Platelets: 329 10*3/uL (ref 150–400)
RBC: 3.49 MIL/uL — ABNORMAL LOW (ref 4.22–5.81)
RDW: 13 % (ref 11.5–15.5)
WBC: 6.8 10*3/uL (ref 4.0–10.5)
nRBC: 0 % (ref 0.0–0.2)

## 2021-04-24 MED ORDER — OXYCODONE HCL ER 10 MG PO T12A
20.0000 mg | EXTENDED_RELEASE_TABLET | Freq: Two times a day (BID) | ORAL | Status: DC
  Administered 2021-04-24 (×2): 20 mg via ORAL
  Filled 2021-04-24 (×2): qty 2

## 2021-04-24 MED ORDER — HYDROMORPHONE HCL 1 MG/ML IJ SOLN
0.5000 mg | Freq: Three times a day (TID) | INTRAMUSCULAR | Status: DC | PRN
  Administered 2021-04-24 – 2021-04-25 (×3): 0.5 mg via INTRAVENOUS
  Filled 2021-04-24 (×3): qty 0.5

## 2021-04-24 NOTE — Progress Notes (Signed)
   HD#10 SUBJECTIVE:  Patient Summary: Jose Hansen is a 50 y.o. with a pertinent PMH of HTN, HLD, severe PVD/PAD s/p R AKA who presented with acute on chronic left leg pain and admitted for severe PAD and is now s/p L AKA from this admission.   Overnight Events: No acute events overnight  Interim History: This is hospital day 10 for Jose Hansen who was seen and evaluated at the bedside this morning. He endorses persistent pain, although he feels like it is more manageable. The pain is still waking him up in the middle of the night, and he describes it as a stabbing feeling.    OBJECTIVE:  Vital Signs: Vitals:   04/23/21 0813 04/23/21 1500 04/23/21 2018 04/24/21 0440  BP: (!) 156/131 (!) 167/99 (!) 162/85 108/75  Pulse: 82 71 67 64  Resp: 18 20 18 16   Temp: 98.7 F (37.1 C) 98 F (36.7 C) 98 F (36.7 C) (!) 97.5 F (36.4 C)  TempSrc: Oral Oral Oral Oral  SpO2: 99% 100% 100% 100%  Weight:    91.8 kg  Height:       Supplemental O2: Room Air SpO2: 100 %  Filed Weights   04/22/21 0457 04/23/21 0447 04/24/21 0440  Weight: 92 kg 90.2 kg 91.8 kg     Intake/Output Summary (Last 24 hours) at 04/24/2021 0714 Last data filed at 04/23/2021 2300 Gross per 24 hour  Intake 680 ml  Output 475 ml  Net 205 ml   Net IO Since Admission: -2,139.33 mL [04/24/21 0714]  Physical Exam: General: No acute distress, but appears to be in pain CV: RRR. No murmurs, rubs, or gallops.  Pulmonary: Lungs CTAB. Normal effort.  Abdominal: Soft, nontender, nondistended. Normal bowel sounds. Extremities: Palpable femoral pulses. S/p R AKA and L AKA with staples intact, edematous stump. Skin: Warm and dry.  Neuro: A&Ox3. Psych: Normal mood and affect    ASSESSMENT/PLAN:  Assessment: Active Problems:   Ischemic leg   Peripheral arterial disease (HCC)   Surgery, elective   S/P AKA (above knee amputation), left (HCC)   Plan: #Left leg ischemia s/p L AKA POD 9 #Severe PAD #S/p R  AKA Patient presented with ischemia of left lower extremity in setting of severe PAD. Has a hx of a right AKA and now patient is post op day 9 following L AKA with vascular surgery. Patient is still in a sigificant amount of pain following his surgery, although it has been more manageable. Overall, patient remains medically stable for discharge and has been pending SNF placement and improvement in pain control. - Increase oxycontin to 20 mg bid scheduled + oxycodone 10 mg q4h prn + dilaudid 0.5 mg q8h prn, goal to wean off dilaudid   - Robaxin 1000 mg tid + tylenol 1000 mg tid + ibuprofen 400 mg q6h - Lyrica 100 mg tid - Lipitor 40 mg  - Pending SNF placement  #Hx of right common fem DVT Eliquis 5 mg bid   Best Practice: Diet: Regular diet IVF: Fluids: none VTE: Eliquis Code: Full AB: None Therapy Recs: SNF, DME: electric wheelchair (faxed paperwork to PCP) DISPO: Anticipated discharge to Skilled nursing facility pending  pain control and SNF placement .  Signature: Buddy Duty, D.O.  Internal Medicine Resident, PGY-1 [facility]  Pager: [phone] 7:14 AM, 04/24/2021   Please contact the on call pager after 5 pm and on weekends at [phone].

## 2021-04-24 NOTE — Progress Notes (Signed)
  Progress Note    04/24/2021 10:28 AM 9 Days Post-Op  Subjective: Pain in bilateral above-knee amputation stumps.    Vitals:   04/24/21 0440 04/24/21 0716  BP: 108/75 113/68  Pulse: 64 62  Resp: 16 16  Temp: (!) 97.5 F (36.4 C) 98 F (36.7 C)  SpO2: 100% 100%   Physical Exam: Cardiac:  regular Lungs:  non labored Incisions:  left AKA well appearing, staples intact. Viable flaps. Edematous thigh but no appreciable fluid collections or hematoma 11/25   Extremities: well perfused and warm Neurologic: alert and oriented  CBC    Component Value Date/Time   WBC 6.8 04/24/2021 0050   RBC 3.49 (L) 04/24/2021 0050   HGB 9.3 (L) 04/24/2021 0050   HGB 15.4 12/03/2020 1202   HCT 28.8 (L) 04/24/2021 0050   HCT 47.0 12/03/2020 1202   PLT 329 04/24/2021 0050   PLT 288 12/03/2020 1202   MCV 82.5 04/24/2021 0050   MCV 81 12/03/2020 1202   MCH 26.6 04/24/2021 0050   MCHC 32.3 04/24/2021 0050   RDW 13.0 04/24/2021 0050   RDW 13.5 12/03/2020 1202   LYMPHSABS 1.8 04/14/2021 1314   LYMPHSABS 3.0 12/03/2020 1202   MONOABS 0.6 04/14/2021 1314   EOSABS 0.2 04/14/2021 1314   EOSABS 0.4 12/03/2020 1202   BASOSABS 0.1 04/14/2021 1314   BASOSABS 0.1 12/03/2020 1202    BMET    Component Value Date/Time   NA 136 04/24/2021 0050   NA 145 (H) 12/03/2020 1202   K 3.7 04/24/2021 0050   CL 106 04/24/2021 0050   CO2 23 04/24/2021 0050   GLUCOSE 127 (H) 04/24/2021 0050   BUN 10 04/24/2021 0050   BUN 11 12/03/2020 1202   CREATININE 1.18 04/24/2021 0050   CALCIUM 8.4 (L) 04/24/2021 0050   GFRNONAA >60 04/24/2021 0050    INR    Component Value Date/Time   INR 1.1 04/14/2021 2124     Intake/Output Summary (Last 24 hours) at 04/24/2021 1028 Last data filed at 04/23/2021 2300 Gross per 24 hour  Intake 480 ml  Output 475 ml  Net 5 ml      Assessment/Plan:  50 y.o. male is s/p left AKA 9 Days Post-Op   Left AKA well appearing Left thigh remains edematous. Elevate  PRN I am unsure as to why he should have pain in bilateral stumps.  He describes the left stump is having neuropathic pain, right stump is having muscular spasm pain. Appreciate excellent care from primary team, and continued titration of his medication regimen TOC assisting in placement  Will have post op appointment arranged for staple removal in 3 weeks     Broadus John,  [facility] [phone] 04/24/2021 10:28 AM

## 2021-04-25 DIAGNOSIS — I739 Peripheral vascular disease, unspecified: Secondary | ICD-10-CM | POA: Diagnosis not present

## 2021-04-25 DIAGNOSIS — I998 Other disorder of circulatory system: Secondary | ICD-10-CM | POA: Diagnosis not present

## 2021-04-25 DIAGNOSIS — Z89612 Acquired absence of left leg above knee: Secondary | ICD-10-CM | POA: Diagnosis not present

## 2021-04-25 LAB — GLUCOSE, CAPILLARY: Glucose-Capillary: 202 mg/dL — ABNORMAL HIGH (ref 70–99)

## 2021-04-25 MED ORDER — HYDROMORPHONE HCL 1 MG/ML IJ SOLN
0.5000 mg | Freq: Four times a day (QID) | INTRAMUSCULAR | Status: DC | PRN
  Administered 2021-04-25 (×3): 0.5 mg via INTRAVENOUS
  Filled 2021-04-25 (×3): qty 0.5

## 2021-04-25 MED ORDER — HYDROMORPHONE HCL 1 MG/ML IJ SOLN: 0.5000 mg | Freq: Four times a day (QID) | INTRAMUSCULAR | Status: DC | PRN

## 2021-04-25 MED ORDER — OXYCODONE HCL ER 15 MG PO T12A
30.0000 mg | EXTENDED_RELEASE_TABLET | Freq: Two times a day (BID) | ORAL | Status: DC
  Administered 2021-04-25 – 2021-04-28 (×7): 30 mg via ORAL
  Filled 2021-04-25 (×9): qty 2

## 2021-04-25 MED ORDER — PREGABALIN 75 MG PO CAPS
150.0000 mg | ORAL_CAPSULE | Freq: Three times a day (TID) | ORAL | Status: DC
  Administered 2021-04-25 – 2021-04-28 (×11): 150 mg via ORAL
  Filled 2021-04-25 (×11): qty 2

## 2021-04-25 MED ORDER — OXYCODONE HCL 5 MG PO TABS
5.0000 mg | ORAL_TABLET | Freq: Four times a day (QID) | ORAL | Status: DC | PRN
  Filled 2021-04-25: qty 1

## 2021-04-25 MED ORDER — OXYCODONE HCL 5 MG PO TABS
10.0000 mg | ORAL_TABLET | Freq: Four times a day (QID) | ORAL | Status: DC
  Administered 2021-04-25 – 2021-04-26 (×4): 10 mg via ORAL
  Filled 2021-04-25 (×4): qty 2

## 2021-04-25 NOTE — Progress Notes (Signed)
HD#11 SUBJECTIVE:  Patient Summary: Jose Hansen is a 50 y.o. with a pertinent PMH of HTN, HLD, severe PVD/PAD s/p R AKA who presented with acute on chronic left leg pain and admitted for severe PAD and is now s/p L AKA from this admission.     Overnight Events: Paged by RN overnight that patient was in significant amount of pain and requested dilaudid. Patient was not due for dilaudid and took his oral oxycodone that he was due for.  Interim History: This is hospital day 11 for Jose Hansen who was seen and evaluated at the bedside this morning. He is still in lot of pain, which he describes as stabbing. Patient states that oral pain med did not work. When he takes his Seroquel, he is able to sleep a little bit and not feel any pain, but he does not stay asleep for long. He also notes that his right shoulder hurts and he has numbness of right fingers.    OBJECTIVE:  Vital Signs: Vitals:   04/24/21 0716 04/24/21 1629 04/24/21 2138 04/25/21 0507  BP: 113/68 136/87 (!) 154/78 109/63  Pulse: 62 65 65 69  Resp: 16 16 18 19   Temp: 98 F (36.7 C) (!) 97.5 F (36.4 C) 98.3 F (36.8 C) 97.8 F (36.6 C)  TempSrc: Oral Oral Oral Oral  SpO2: 100% 100% 100% 92%  Weight:      Height:       Supplemental O2: Room Air SpO2: 92 %   Filed Weights   04/22/21 0457 04/23/21 0447 04/24/21 0440  Weight: 92 kg 90.2 kg 91.8 kg     Intake/Output Summary (Last 24 hours) at 04/25/2021 0646 Last data filed at 04/25/2021 0109 Gross per 24 hour  Intake --  Output 1300 ml  Net -1300 ml   Net IO Since Admission: -3,439.33 mL [04/25/21 0646]  Physical Exam: General: Patient is in acute distress. CV: RRR. No murmurs, rubs, or gallops.  Pulmonary: Lungs CTAB. Normal effort.  Abdominal: Soft, nontender, nondistended. Normal bowel sounds. Extremities: Bilateral AKAs. L AKA with staples intact, no signs of infection Skin: Warm and dry. No obvious rash or lesions. Neuro: A&Ox3. No focal  deficit. Psych: Normal mood and affect       ASSESSMENT/PLAN:  Assessment: Active Problems:   Ischemic leg   Peripheral arterial disease (HCC)   Surgery, elective   S/P AKA (above knee amputation), left (HCC)   Plan: #Left leg ischemia s/p L AKA POD 10 #Severe PAD #S/p R AKA Patient presented with ischemia of left lower extremity in setting of severe PAD. Has a hx of a right AKA and now patient is post op day 10 of L AKA. Patient is still in a sigificant amount of pain following his surgery and feels that the oral medications are not helping him. SNF placement still pending, although patient is now agreeable to go to [facility] if there is a bed offer and once his pain is better controlled.  - Scheduled pain mgmt: oxycontin to 30 mg bid + oxycodone 10 mg q6h  - Oxycodone 5 mg q6h prn for severe pain OR dilaudid 0.5 mg  - Robaxin 1000 mg tid + tylenol 1000 mg tid + ibuprofen 400 mg q6h - Increase lyrica to 150 mg tid - Lipitor 40 mg  - Pending SNF placement   #Hx of right common fem DVT Eliquis 5 mg bid  Best Practice: Diet: Regular diet IVF: Fluids: none VTE: Eliquis Code: Full AB: None  DISPO: Anticipated discharge to Skilled nursing facility pending  pain control and placement .  Signature: Buddy Duty, D.O.  Internal Medicine Resident, PGY-1 [facility]  Pager: [phone] 6:46 AM, 04/25/2021   Please contact the on call pager after 5 pm and on weekends at [phone].

## 2021-04-25 NOTE — Plan of Care (Signed)

## 2021-04-25 NOTE — Progress Notes (Signed)
Paged a 12:30 that pt reporting intolerable pain. Current pain regimen is Oxycontin to 20 mg bid scheduled + oxycodone 10 mg q4h prn + dilaudid 0.5 mg q8h prn, with goal to wean off dilaudid. He is requesting dilaudid. He was not due for dilaudid for another 40 mins, but is due for oxycodone; he is refusing oxy and insists on dilaudid. Pt seen at bedside. Explained the importance of optimizing his pain regiment to include long acting rather than simply utilizing short acting. Pt expresses understanding, oxycodone given and dilaudid next due at 1 am.

## 2021-04-26 DIAGNOSIS — Z89612 Acquired absence of left leg above knee: Secondary | ICD-10-CM | POA: Diagnosis not present

## 2021-04-26 DIAGNOSIS — I998 Other disorder of circulatory system: Secondary | ICD-10-CM | POA: Diagnosis not present

## 2021-04-26 MED ORDER — OXYCODONE HCL 5 MG PO TABS
15.0000 mg | ORAL_TABLET | Freq: Four times a day (QID) | ORAL | Status: DC
  Administered 2021-04-26 – 2021-04-28 (×9): 15 mg via ORAL
  Filled 2021-04-26 (×9): qty 3

## 2021-04-26 MED ORDER — HYDROMORPHONE HCL 1 MG/ML IJ SOLN: 0.5000 mg | Freq: Three times a day (TID) | INTRAMUSCULAR | Status: DC | PRN

## 2021-04-26 MED ORDER — OXYCODONE HCL 5 MG PO TABS
5.0000 mg | ORAL_TABLET | Freq: Three times a day (TID) | ORAL | Status: DC | PRN
  Administered 2021-04-28: 5 mg via ORAL
  Filled 2021-04-26: qty 1

## 2021-04-26 MED ORDER — DICLOFENAC SODIUM 1 % EX GEL
2.0000 g | Freq: Four times a day (QID) | CUTANEOUS | Status: DC
  Administered 2021-04-26 – 2021-04-28 (×9): 2 g via TOPICAL
  Filled 2021-04-26: qty 100

## 2021-04-26 NOTE — Progress Notes (Signed)
Occupational Therapy Treatment Patient Details Name: Jose Hansen MRN: [id number] DOB: 1971/02/08 Today's Date: 04/26/2021   History of present illness 50 yo male presents to ED on 11/16 with severe acute on chronic left lower extremity pain. Pt was schedule for LLE BKA vs AKA for 12/3 with Dr Donzetta Matters (vascular surgery). S/p L AKA on 11/17. PMH including HTN, HLD, severe PVD s/p left femoral to posterior tibial artery bypass with now graft occlusion with no further revascularization options, right AKA, and chronic right common femoral DVT on Eliquis.   OT comments  Pt is progressing towards OT goals. During session, pt completed lateral scoot transfers from bed to w/c, w/c to toilet, and w/c to recliner with Min guard. Pt also completed toilet hygiene independently. Pt mentioned that he thought his arms were getting weaker so OT provided pt with theraband and educated in exercises. Will follow acutely.   Recommendations for follow up therapy are one component of a multi-disciplinary discharge planning process, led by the attending physician.  Recommendations may be updated based on patient status, additional functional criteria and insurance authorization.    Follow Up Recommendations  Home health OT    Assistance Recommended at Discharge Intermittent Supervision/Assistance  Equipment Recommendations  BSC/3in1 (drop arm)    Recommendations for Other Services      Precautions / Restrictions Precautions Precautions: Fall Restrictions Weight Bearing Restrictions: Yes LLE Weight Bearing: Non weight bearing Other Position/Activity Restrictions: patient has prior R AKA       Mobility Bed Mobility Overal bed mobility: Modified Independent                  Transfers Overall transfer level: Needs assistance Equipment used: None Transfers: Bed to chair/wheelchair/BSC            Lateral/Scoot Transfers: Min guard       Balance Overall balance assessment: Needs  assistance Sitting-balance support: Bilateral upper extremity supported Sitting balance-Leahy Scale: Fair                                     ADL either performed or assessed with clinical judgement   ADL Overall ADL's : Needs assistance/impaired                         Toilet Transfer: Min guard;Transfer board;Regular Toilet;Grab bars [employer] Details (indicate cue type and reason): Min guard for safety. From drop arm w/c to standard toilet. Toileting- Clothing Manipulation and Hygiene: Independent;Sitting/lateral lean              Extremity/Trunk Assessment              Vision       Perception     Praxis      Cognition Arousal/Alertness: Awake/alert Behavior During Therapy: WFL for tasks assessed/performed Overall Cognitive Status: Within Functional Limits for tasks assessed                                            Exercises Exercises: General Upper Extremity General Exercises - Upper Extremity Shoulder Flexion: AROM;Both;10 reps;Seated;Theraband Shoulder Extension: AROM;Both;10 reps;Seated;Theraband Shoulder Horizontal ABduction: AROM;Both;10 reps;Seated;Theraband Shoulder Horizontal ADduction: AROM;Both;10 reps;Seated;Theraband Elbow Flexion: AROM;Both;10 reps;Seated;Theraband Elbow Extension: AROM;Both;10 reps;Seated;Theraband   Shoulder Instructions       General Comments  Pertinent Vitals/ Pain       Pain Assessment: Faces Faces Pain Scale: Hurts little more Pain Location: L residual limb Pain Descriptors / Indicators: Grimacing;Guarding Pain Intervention(s): Monitored during session;Repositioned;RN gave pain meds during session  Home Living                                          Prior Functioning/Environment              Frequency  Min 3X/week        Progress Toward Goals  OT Goals(current goals can now be found in the care plan section)  Progress  towards OT goals: Progressing toward goals  Acute Rehab OT Goals Patient Stated Goal: increase strength OT Goal Formulation: With patient Time For Goal Achievement: 05/03/21 Potential to Achieve Goals: Good ADL Goals Pt Will Perform Lower Body Dressing: with modified independence;sitting/lateral leans Pt Will Transfer to Toilet: with modified independence;bedside commode;with transfer board;anterior/posterior transfer Pt Will Perform Toileting - Clothing Manipulation and hygiene: with modified independence;sitting/lateral leans  Plan Discharge plan needs to be updated;Frequency remains appropriate    Co-evaluation                 AM-PAC OT "6 Clicks" Daily Activity     Outcome Measure   Help from another person eating meals?: None Help from another person taking care of personal grooming?: A Little Help from another person toileting, which includes using toliet, bedpan, or urinal?: A Little Help from another person bathing (including washing, rinsing, drying)?: A Little Help from another person to put on and taking off regular upper body clothing?: A Little Help from another person to put on and taking off regular lower body clothing?: A Lot 6 Click Score: 18    End of Session    OT Visit Diagnosis: Unsteadiness on feet (R26.81);Other abnormalities of gait and mobility (R26.89);Muscle weakness (generalized) (M62.81);Pain Pain - Right/Left: Left Pain - part of body: Leg   Activity Tolerance Patient tolerated treatment well   Patient Left in chair;with call bell/phone within reach;with nursing/sitter in room   Nurse Communication Mobility status        Time: 3151-7616 OT Time Calculation (min): 22 min  Charges: OT General Charges $OT Visit: 1 Visit OT Treatments $Self Care/Home Management : 8-22 mins  Zoya Sprecher C, OT/L  Acute Rehab [place] 04/26/2021, 1:19 PM

## 2021-04-26 NOTE — Progress Notes (Signed)
Inpatient Rehab Admissions Coordinator:   Per PT recommendations, patient was re- screened for CIR candidacy by Clemens Catholic, MS, CCC-SLP. At this time, Pt. is pain limited, but is min guard-supervision with all transfers attempted. I have concerns both about Pt. Ability to tolerate the intensity of CIR due to pain and that Pt. May not require the intensity of  CIR once pain is better managed. I will not pursue CIR consult at this time. Please contact me with any questions.   Clemens Catholic, [place], [place] Admissions Coordinator  [phone] ([place]) [phone] (office)

## 2021-04-26 NOTE — Progress Notes (Signed)
Pt started on schedule pain meds today, tolerating well. Pt has been able to rest more this afternoon once pain meds were on board. He states he like the Voltaren cream. Pt able to sit up in recliner for about an hour today.

## 2021-04-26 NOTE — Progress Notes (Addendum)
   HD#12 SUBJECTIVE:  Patient Summary: Jose Hansen is a 50 y.o. with a pertinent PMH of HTN, HLD, severe PVD/PAD s/p R AKA who presented with acute on chronic left leg pain and admitted for severe PAD and is now s/p L AKA from this admission.   Overnight Events: No acute events overnight.  Interim History: This is hospital day 12 for Jose Hansen who was seen and evaluated at the bedside this morning. He reports muscle spasms as the primary source of his discomfort and has not noticed much of an improvement in this after getting robaxin.   OBJECTIVE:  Vital Signs: Vitals:   04/25/21 0507 04/25/21 0853 04/25/21 1601 04/25/21 2231  BP: 109/63 (!) 123/55 140/78 (!) 145/85  Pulse: 69 70 67 64  Resp: 19 18 17 16   Temp: 97.8 F (36.6 C) 98 F (36.7 C) 98.3 F (36.8 C) 98.4 F (36.9 C)  TempSrc: Oral Oral Oral Oral  SpO2: 92% 99% 99% 99%  Weight:      Height:       Supplemental O2: Room Air SpO2: 99 %  Filed Weights   04/22/21 0457 04/23/21 0447 04/24/21 0440  Weight: 92 kg 90.2 kg 91.8 kg     Intake/Output Summary (Last 24 hours) at 04/26/2021 0625 Last data filed at 04/26/2021 0000 Gross per 24 hour  Intake 420 ml  Output 875 ml  Net -455 ml   Net IO Since Admission: -3,654.33 mL [04/26/21 0625]  Physical Exam: General: No acute distress. CV: RRR. No murmurs, rubs, or gallops.  Pulmonary: Lungs CTAB. Normal effort. No wheezing or rales. Abdominal: Soft, nontender, nondistended. Normal bowel sounds. Extremities: Bilateral AKAs. L AKA with staples intact, no erythema, and edema is improving.  Skin: Warm and dry. No obvious rash or lesions. Neuro: A&Ox3.  Psych: Normal mood and affect    ASSESSMENT/PLAN:  Assessment: Active Problems:   Ischemic leg   Peripheral arterial disease (HCC)   Surgery, elective   S/P AKA (above knee amputation), left (HCC)   Plan: #Left leg ischemia s/p L AKA POD 11 #Severe PAD #S/p R AKA Patient presented with ischemia  of left lower extremity in setting of severe PAD. Has a hx of a right AKA and now patient is post op day 11 of L AKA. Pain remains severe, with muscle spasms being the most bothersome to the patient today. Discussed discontinuing IV pain medications today, and increasing oral pain medications. SNF placement, to [facility], is pending.  - Scheduled pain meds: Oxycontin 30 mg bid + oxycodone 15 mg q6h  - PRN pain meds: oxycodone 5 mg q8h prn for breakthrough pain - Tylenol 1000 mg tid + robaxin 1000 mg tid + ibuprofen 400 mg q6h - Lyrica 150 mg tid + Duloxetine 60 mg daily  - Lipitor 40 mg  - Pending SNF placement   #Hx of right common fem DVT Eliquis 5 mg bid.   Best Practice: Diet: Regular diet IVF: Fluids: none VTE: Eliquis Code: Full AB: None Therapy Recs: SNF, DME: wheelchair DISPO: Anticipated discharge to Skilled nursing facility pending  placement and pain improvement .  Signature: Buddy Duty, D.O.  Internal Medicine Resident, PGY-1 [facility]  Pager: [phone] 6:25 AM, 04/26/2021   Please contact the on call pager after 5 pm and on weekends at [phone].

## 2021-04-26 NOTE — Progress Notes (Signed)
PT Cancellation Note  Patient Details Name: Jose Hansen MRN: [id number] DOB: [birth year]   Cancelled Treatment:    Reason Eval/Treat Not Completed: Patient declined, no reason specified.  Just lately back in bed.  Not ready to get back up. 04/26/2021  [name], PT Acute Rehabilitation Services [phone]  (pager) [phone]  (office)   [name] 04/26/2021, 4:41 PM

## 2021-04-27 DIAGNOSIS — Z89612 Acquired absence of left leg above knee: Secondary | ICD-10-CM | POA: Diagnosis not present

## 2021-04-27 LAB — BASIC METABOLIC PANEL
Anion gap: 7 (ref 5–15)
BUN: 10 mg/dL (ref 6–20)
CO2: 24 mmol/L (ref 22–32)
Calcium: 8.8 mg/dL — ABNORMAL LOW (ref 8.9–10.3)
Chloride: 105 mmol/L (ref 98–111)
Creatinine, Ser: 1.26 mg/dL — ABNORMAL HIGH (ref 0.61–1.24)
GFR, Estimated: >60 mL/min (ref >60)
Glucose, Bld: 100 mg/dL — ABNORMAL HIGH (ref 70–99)
Potassium: 4 mmol/L (ref 3.5–5.1)
Sodium: 136 mmol/L (ref 135–145)

## 2021-04-27 LAB — CBC
HCT: 32 % — ABNORMAL LOW (ref 39.0–52.0)
Hemoglobin: 10 g/dL — ABNORMAL LOW (ref 13.0–17.0)
MCH: 26.1 pg (ref 26.0–34.0)
MCHC: 31.3 g/dL (ref 30.0–36.0)
MCV: 83.6 fL (ref 80.0–100.0)
Platelets: 358 10*3/uL (ref 150–400)
RBC: 3.83 MIL/uL — ABNORMAL LOW (ref 4.22–5.81)
RDW: 12.9 % (ref 11.5–15.5)
WBC: 7.7 10*3/uL (ref 4.0–10.5)
nRBC: 0 % (ref 0.0–0.2)

## 2021-04-27 NOTE — Progress Notes (Signed)
   HD#13 SUBJECTIVE:  Patient Summary: Jose Hansen is a 50 y.o. with a pertinent PMH of HTN, HLD, severe PVD/PAD s/p R AKA who presented with acute on chronic left leg pain and admitted for severe PAD and is now s/p L AKA from this admission.   Overnight Events: No acute events overnight  Interim History: This is hospital day 13 for Jose Hansen who was seen and evaluated at the bedside this morning. He reports his pain has improved. However, he feels he cannot leave today because he is in too much pain.   OBJECTIVE:  Vital Signs: Vitals:   04/26/21 0737 04/26/21 1612 04/26/21 1925 04/27/21 0433  BP: 107/86 (!) 140/91 137/85 105/65  Pulse: 69 70 78 68  Resp: 16 16 17 18   Temp: 97.6 F (36.4 C) 98.2 F (36.8 C) 98 F (36.7 C) (!) 97.3 F (36.3 C)  TempSrc: Oral Oral Oral Oral  SpO2: 100% 99% 98% 100%  Weight:    97.1 kg  Height:       Supplemental O2: Room Air SpO2: 100 %   Filed Weights   04/23/21 0447 04/24/21 0440 04/27/21 0433  Weight: 90.2 kg 91.8 kg 97.1 kg     Intake/Output Summary (Last 24 hours) at 04/27/2021 0643 Last data filed at 04/27/2021 0000 Gross per 24 hour  Intake 120 ml  Output 650 ml  Net -530 ml   Net IO Since Admission: -4,184.33 mL [04/27/21 0643]  Physical Exam: General: No acute distress. CV: RRR. No murmurs, rubs, or gallops.  Pulmonary: Lungs CTAB. Normal effort.  Abdominal: Soft, nontender, nondistended. Normal bowel sounds. Extremities: s/p bilateral AKAs. L AKA staples intact, no erythema and improvement in L stump edema Skin: Warm and dry. No obvious rash or lesions. Neuro: A&Ox3. No focal deficit. Psych: Normal mood and affect   ASSESSMENT/PLAN:  Assessment: Active Problems:   Ischemic leg   Peripheral arterial disease (HCC)   Surgery, elective   S/P AKA (above knee amputation), left (HCC)   Plan: #Left leg ischemia s/p L AKA POD 12 #Severe PAD #S/p R AKA Patient presented with ischemia of left lower  extremity in setting of severe PAD. Has a hx of a right AKA and now patient is post op day 12 of L AKA. Continued to adjust patient's pain regimen yesterday, and he noted an improvement in pain control today- did not require any prn doses of dilaudid 5 mg in the last 24 hrs. Overall improved, however, patient does not feel like he would be ready to go to a SNF today.  - Continue oxycontin 30 mg bid + oxycodone 15 mg q6h - PRNs: oxycodone 5 mg q8h  - Tylenol 1000 mg tid + robaxin 1000 mg tid + ibuprofen 400 mg q6h - Lyrica 150 mg tid + duloxetine 60 mg daily - Lipitor 40 mg daily - SNF placement pending  #Hx of R common fem DVT Eliquis 5 mg bid   Best Practice: Diet: Regular diet IVF: Fluids: none VTE: Eliquis Code: Full AB: none Therapy Recs: SNF, DME: wheelchair DISPO: Anticipated discharge to Skilled nursing facility pending  pain control and placement .  Signature: Buddy Duty, D.O.  Internal Medicine Resident, PGY-1 [facility]  Pager: [phone] 6:43 AM, 04/27/2021   Please contact the on call pager after 5 pm and on weekends at [phone].

## 2021-04-27 NOTE — Progress Notes (Signed)
Physical Therapy Treatment Patient Details Name: Jose Hansen MRN: [id number] DOB: 1970-11-15 Today's Date: 04/27/2021   History of Present Illness 50 yo male presents to ED on 11/16 with severe acute on chronic left lower extremity pain.  S/p L AKA on 11/17. PMH including HTN, HLD, severe PVD s/p left femoral to posterior tibial artery bypass with now graft occlusion with no further revascularization options, right AKA, and chronic right common femoral DVT on Eliquis.    PT Comments    Patient progressing slowly towards PT goals. Reports phantom limb pain and sensitivity to touch on left; discussed importance of desensitization techniques to help with pain management and sensitivity. Pt currently has a w/c that is unsafe to use at this time due to having no tippers and older brakes/wheels. Pt has already tipped backwards in w/c going to/from bathroom. Noted to have generalized weakness/debility due to long hospitalization but motivated to work with therapy. Due to change in status to bil AKAs, recommend w/c appropriate for amputee. Pt continues to be a fall risk when using current w/c. Will follow and progress as tolerated.   Recommendations for follow up therapy are one component of a multi-disciplinary discharge planning process, led by the attending physician.  Recommendations may be updated based on patient status, additional functional criteria and insurance authorization.  Follow Up Recommendations  Acute inpatient rehab (3hours/day)     Assistance Recommended at Discharge Intermittent Supervision/Assistance  Equipment Recommendations  Wheelchair (measurements PT);Wheelchair cushion (measurements PT)    Recommendations for Other Services       Precautions / Restrictions Precautions Precautions: Fall Precaution Comments: bil AKAs     Mobility  Bed Mobility Overal bed mobility: Modified Independent             General bed mobility comments: No assist needed, HOB  elevated.    Transfers Overall transfer level: Needs assistance Equipment used: None Transfers: Bed to chair/wheelchair/BSC            Lateral/Scoot Transfers: Mod assist;Min guard General transfer comment: Requires therapist to stabilize chair during transfer due to poor brakes and lack of tippers resulting in w/c moving/flipping backwards, assist with setup and setting brakes prior to transfer.  Lateral scoot bed to/from w/c and w/c to/from toilet, difficulty getting over wheel due to wheels being too far forward.    Ambulation/Gait               General Gait Details: unable   Stairs             Wheelchair Mobility    Modified Rankin (Stroke Patients Only)       Balance Overall balance assessment: Needs assistance Sitting-balance support: Bilateral upper extremity supported Sitting balance-Leahy Scale: Fair                                      Cognition Arousal/Alertness: Awake/alert Behavior During Therapy: WFL for tasks assessed/performed Overall Cognitive Status: Within Functional Limits for tasks assessed                                          Exercises      General Comments General comments (skin integrity, edema, etc.): Reports phantom limb pain. Encouraged desensitization techniques.      Pertinent Vitals/Pain Pain Assessment: Faces Faces Pain Scale: Hurts whole  lot Pain Location: L residual limb Pain Descriptors / Indicators: Grimacing;Guarding;Sore Pain Intervention(s): Monitored during session;Patient requesting pain meds-RN notified;Repositioned    Home Living                          Prior Function            PT Goals (current goals can now be found in the care plan section) Progress towards PT goals: Progressing toward goals    Frequency    Min 3X/week      PT Plan Current plan remains appropriate    Co-evaluation              AM-PAC PT "6 Clicks" Mobility    Outcome Measure  Help needed turning from your back to your side while in a flat bed without using bedrails?: None Help needed moving from lying on your back to sitting on the side of a flat bed without using bedrails?: None Help needed moving to and from a bed to a chair (including a wheelchair)?: A Lot Help needed standing up from a chair using your arms (e.g., wheelchair or bedside chair)?: Total Help needed to walk in hospital room?: Total Help needed climbing 3-5 steps with a railing? : Total 6 Click Score: 13    End of Session   Activity Tolerance: Other (comment);Patient tolerated treatment well Patient left: in bed;with call bell/phone within reach Nurse Communication: Mobility status PT Visit Diagnosis: Other abnormalities of gait and mobility (R26.89);Pain Pain - Right/Left: Left Pain - part of body: Leg     Time: 2820-8138 PT Time Calculation (min) (ACUTE ONLY): 18 min  Charges:  $Therapeutic Activity: 8-22 mins                     Marisa Severin, PT, DPT Acute Rehabilitation Services Pager [phone] Office [phone]      Marguarite Arbour A Sabra Heck 04/27/2021, 3:26 PM

## 2021-04-28 ENCOUNTER — Telehealth: Payer: Self-pay

## 2021-04-28 DIAGNOSIS — I998 Other disorder of circulatory system: Secondary | ICD-10-CM | POA: Diagnosis not present

## 2021-04-28 DIAGNOSIS — Z89612 Acquired absence of left leg above knee: Secondary | ICD-10-CM | POA: Diagnosis not present

## 2021-04-28 DIAGNOSIS — I739 Peripheral vascular disease, unspecified: Secondary | ICD-10-CM | POA: Diagnosis not present

## 2021-04-28 LAB — RESP PANEL BY RT-PCR (FLU A&B, COVID) ARPGX2
Influenza A by PCR: NEGATIVE
Influenza B by PCR: NEGATIVE
SARS Coronavirus 2 by RT PCR: NEGATIVE

## 2021-04-28 LAB — GLUCOSE, CAPILLARY: Glucose-Capillary: 170 mg/dL — ABNORMAL HIGH (ref 70–99)

## 2021-04-28 MED ORDER — POLYETHYLENE GLYCOL 3350 17 G PO PACK: 17.0000 g | PACK | Freq: Two times a day (BID) | ORAL | 0 refills | Status: DC

## 2021-04-28 MED ORDER — IBUPROFEN 400 MG PO TABS: 400.0000 mg | ORAL_TABLET | Freq: Four times a day (QID) | ORAL | 0 refills | Status: DC

## 2021-04-28 MED ORDER — OXYCODONE HCL ER 30 MG PO T12A
30.0000 mg | EXTENDED_RELEASE_TABLET | Freq: Two times a day (BID) | ORAL | 0 refills | Status: DC
Start: 2021-04-28 — End: 2021-04-30

## 2021-04-28 MED ORDER — SENNOSIDES-DOCUSATE SODIUM 8.6-50 MG PO TABS: 1.0000 | ORAL_TABLET | Freq: Every day | ORAL | Status: DC

## 2021-04-28 MED ORDER — METHOCARBAMOL 1000 MG PO TABS: 1000.0000 mg | ORAL_TABLET | Freq: Three times a day (TID) | ORAL | 0 refills | Status: AC

## 2021-04-28 MED ORDER — ATORVASTATIN CALCIUM 40 MG PO TABS: 40.0000 mg | ORAL_TABLET | Freq: Every day | ORAL | 0 refills | Status: DC

## 2021-04-28 MED ORDER — PREGABALIN 150 MG PO CAPS: 150.0000 mg | ORAL_CAPSULE | Freq: Three times a day (TID) | ORAL | 0 refills | Status: DC

## 2021-04-28 MED ORDER — OXYCODONE HCL 15 MG PO TABS: 15.0000 mg | ORAL_TABLET | Freq: Four times a day (QID) | ORAL | 0 refills | Status: AC

## 2021-04-28 NOTE — Care Management Important Message (Signed)
Important Message  Patient Details  Name: Jose Hansen MRN: [id number] Date of Birth: 05-03-1971   Medicare Important Message Given:  Yes     Hannah Beat 04/28/2021, 11:57 AM

## 2021-04-28 NOTE — Discharge Summary (Addendum)
Name: Jose Hansen MRN: [id number] DOB: 02-04-1971 50 y.o. PCP: Jose Schuller, MD  Date of Admission: 04/14/2021 12:48 PM Date of Discharge:  04/28/2021 Attending Physician: Dr. Philipp Hansen  DISCHARGE DIAGNOSIS:  Primary Problem: Ischemic leg   Hansen Problems: Principal Problem:   Ischemic leg Active Problems:   Peripheral arterial disease (HCC)   Surgery, elective   S/P AKA (above knee amputation), left (HCC)    DISCHARGE MEDICATIONS:   Allergies as of 04/28/2021       Reactions   Contrast Media [iodinated Diagnostic Agents] Nausea Only   NOT AN ALLERGY. ONLY NAUSEA. 12/11/20 Newly reported by [facility] on 12/04/20.        Medication List     TAKE these medications    acetaminophen 325 MG tablet Commonly known as: TYLENOL Take 2 tablets (650 mg total) by mouth every 6 (six) hours as needed for moderate pain or mild pain.   aspirin EC 81 MG tablet Take 81 mg by mouth daily. Swallow whole.   atorvastatin 40 MG tablet Commonly known as: LIPITOR Take 1 tablet (40 mg total) by mouth at bedtime. What changed:  medication strength how much to take when to take this   DULoxetine 60 MG capsule Commonly known as: Cymbalta Take 1 capsule (60 mg total) by mouth daily.   Eliquis 5 MG Tabs tablet Generic drug: apixaban Take 1 tablet (5 mg total) by mouth 2 (two) times daily.   famotidine 20 MG tablet Commonly known as: PEPCID Take 1 tablet (20 mg total) by mouth daily.   ibuprofen 400 MG tablet Commonly known as: ADVIL Take 1 tablet (400 mg total) by mouth every 6 (six) hours for 7 days.   Methocarbamol 1000 MG Tabs Take 1,000 mg by mouth 3 (three) times daily for 10 days.   oxyCODONE 15 MG immediate release tablet Commonly known as: ROXICODONE Take 1 tablet (15 mg total) by mouth every 6 (six) hours for 7 days.   oxyCODONE 30 MG 12 hr tablet Take 1 tablet (30 mg total) by mouth every 12 (twelve) hours for 7 days.   polyethylene glycol 17  g packet Commonly known as: MIRALAX / GLYCOLAX Take 17 g by mouth 2 (two) times daily.   pregabalin 150 MG capsule Commonly known as: LYRICA Take 1 capsule (150 mg total) by mouth 3 (three) times daily. What changed:  medication strength how much to take   QUEtiapine 50 MG tablet Commonly known as: SEROQUEL Take 3 tablets (150 mg total) by mouth at bedtime.   senna-docusate 8.6-50 MG tablet Commonly known as: Senokot-S Take 1 tablet by mouth at bedtime.               Durable Medical Equipment  (From admission, onward)           Start     Ordered   04/20/21 1111  For home use only DME Wheelchair electric  Once       Comments: #Left leg ischemia s/p L AKA, Severe PAD, S/p R AKA   Patient suffers from Left leg ischemia s/p L AKA, Severe PAD, S/p R AKA  which impairs their ability to perform daily activities like ambulating  in the home.  A cane  will not resolve issue with performing activities of daily living. A wheelchair will allow patient to safely perform daily activities. Patient can safely propel the wheelchair in the home or has a caregiver who can provide assistance. Length of need lifetime .   04/20/21  1114              Discharge Care Instructions  (From admission, onward)           Start     Ordered   04/28/21 0000  No dressing needed        04/28/21 0944            DISPOSITION AND FOLLOW-UP:  [name] was discharged from Jose Hansen. At the Hansen follow up visit please address:  Follow-up Recommendations: Consults: Jose surgery Labs: Basic Metabolic Profile and CBC (recheck Cr, as patient has been on NSAIDs and recheck Hb) Studies: None Medications: Oxycontin 30 mg bid + oxycodone 15 mg q6h - will need these taped off  Continue eliquis 5 mg bid + lipitor 40 mg daily  Increased lyrica dose: 150 mg tid Can continue tylenol and ibuprofen for pain, as well  Follow-up  Appointments:  Follow-up Information     [facility] Follow up in 3 week(s).   Specialty: Jose Surgery Why: Office will call you to arrange your appt (sent) Contact information: [address] [place], [place], [place]. Go on 05/05/2021.   Specialty: Internal Medicine Why: 3:15 appt- Hansen follow up Contact information: Jose Hansen [phone]                 Hansen COURSE:  Patient Summary: Jose Hansen is a 50 y.o. with a pertinent PMH of HTN, HLD, severe PVD/PAD s/p R AKA who presented with acute on chronic left leg pain/ischemic left leg and admitted for severe PAD and is now s/p L AKA from this admission.   #Peripheral arterial disease #Hx Right AKA, remote #S/p failed Left common fem to posterior tibial artery bypass  #S/P Left AKA on 11/17 Patient presented with acute on chronic left leg pain that is severe, and described as stabbing in nature. This pain had been getting worse since the patient's last visit with Jose surgery on 10/26. The patient takes lyrica and percocet at home for his pain, and nothing has given him any relief. He has a history of previous right AKA many years ago and left common femoral to posterior tibial artery bypass with prosthetic graft in August of 2022. The surgical sites are all clean, dry, and intact without any surrounding erythema or edema. He was scheduled for a left leg amputation on 12/3 with Jose, and Jose surgery was consulted from the ED for his acute left leg pain, and they recommended no further imaging and that the patient undergo an amputation this admission. Underwent left AKA with Jose surgery on 11/17. Throughout hospitalization, the surgical site was clean and no signs of infection were noted. Patient was initially placed on a PCA pump for post-op pain control, however, we weaned this off. Trialed  various different pain regimens for this patient, with oxycontin, oxycodone, and dilaudid being required most days. Weaned off dilaudid after patient was on oxycontin 30 mg bid + oxycodone 15 mg q6h. Will be discharged with these pain medications and will have follow up with the Jose Hansen for tapering of these medications.  Appt is 12/7 with Dr. Alfonse Spruce  Patient also recommended for and requesting to have power wheelchair. He had a fitting scheduled with [employer] for wheelchair evaluation which had to be canceled given his recent hospitalization. Manuela Schwartz AOZHYQ is the physical therapist at  [facility] who will need to be contacted in order to get his wheelchair while at SNF- recommend contact with her to get this process continued.    #History of right common femoral DVT Patient states that he has been taking Eliquis 5 mg bid for many years for his previous DVT. Continued this throughout admission after bleeding stabilized.   #Acute blood loss anemia Hb trending down 12.3>10.9>8.8>7.6 on Hansen day 5. Patient is symptomatic from this, endorsing dizziness and lightheadedness. Denies any hematemesis, melena, or abd pain. Received 1u PRBC and Hb appropriately responded. Hb remained stable thereafter.    #Depression #Insomnia Resumed home Seroquel and Cymbalta    DISCHARGE INSTRUCTIONS:   Discharge Instructions     Call MD for:  redness, tenderness, or signs of infection (pain, swelling, redness, odor or green/yellow discharge around incision site)   Complete by: As directed    Call MD for:  temperature >100.4   Complete by: As directed    Diet general   Complete by: As directed    Increase activity slowly   Complete by: As directed    No dressing needed   Complete by: As directed       Dear Mr. Jose Hansen,  It was a pleasure to take care of you. You were hospitalized for worsening of your peripheral Jose disease, which ultimately required your left leg amputation. Please continue to take your  eliquis 5 mg twice daily and lipitor 40 mg daily. You will need to follow up with the Jose surgeons to get your staples removed.   For pain, you can take your oxycontin 30 mg twice daily, and oxycodone 15 mg every 6 hours. Please follow up with Dr. Alfonse Spruce at the Internal medicine center next week, regarding tapering off of these medications and for a Hansen follow up.  Good luck with rehab! The social workers at the rehab facility can help coordinate getting your electric wheelchair, and this can also be discussed with Dr. Alfonse Spruce at your appt next week.   If you have any questions or concerns, call our clinic at [phone] or after hours call [phone] and ask for the internal medicine resident on call.  SUBJECTIVE:  Jose Hansen was seen and evaluated on the day of discharge. His pain remains well controlled and the patient is medically stable for discharge to SNF. Patient had no acute complaints at the time of discharge.  Discharge Vitals:   BP 126/72 (BP Location: Left Arm)   Pulse 74   Temp 97.6 F (36.4 C) (Oral)   Resp 15   Ht 5\' 11"  (1.803 m)   Wt 91.3 kg   SpO2 99%   BMI 28.07 kg/m   OBJECTIVE:  General:  No acute distress. Head: Normocephalic. Atraumatic. CV: RRR. No murmurs, rubs, or gallops.  Pulmonary: Lungs CTAB. Normal effort.  Abdominal: Soft, nontender, nondistended. Normal bowel sounds. Extremities: S/p bilateral AKA. L AKA with staples intact, edema improving and no erythema noted.  Skin: Warm and dry. No obvious rash or lesions. Neuro: A&Ox3. No focal deficit. Psych: Normal mood and affect    Pertinent Labs, Studies, and Procedures:  CBC Latest Ref Rng & Units 04/27/2021 04/24/2021 04/22/2021  WBC 4.0 - 10.5 K/uL 7.7 6.8 7.5  Hemoglobin 13.0 - 17.0 g/dL 10.0(L) 9.3(L) 9.6(L)  Hematocrit 39.0 - 52.0 % 32.0(L) 28.8(L) 29.7(L)  Platelets 150 - 400 K/uL 358 329 330    CMP Latest Ref Rng & Units 04/27/2021 04/24/2021 04/21/2021  Glucose 70 -  99  mg/dL 100(H) 127(H) 147(H)  BUN 6 - 20 mg/dL 10 10 9   Creatinine 0.61 - 1.24 mg/dL 1.26(H) 1.18 1.08  Sodium 135 - 145 mmol/L 136 136 138  Potassium 3.5 - 5.1 mmol/L 4.0 3.7 3.5  Chloride 98 - 111 mmol/L 105 106 106  CO2 22 - 32 mmol/L 24 23 26   Calcium 8.9 - 10.3 mg/dL 8.8(L) 8.4(L) 8.5(L)  Total Protein 6.5 - 8.1 g/dL - - -  Total Bilirubin 0.3 - 1.2 mg/dL - - -  Alkaline Phos 38 - 126 U/L - - -  AST 15 - 41 U/L - - -  ALT 0 - 44 U/L - - -    No results found.   Signed: Buddy Duty, D.O.  Internal Medicine Resident, PGY-1 [facility]  Pager: [phone] 9:45 AM, 04/28/2021

## 2021-04-28 NOTE — Plan of Care (Signed)

## 2021-04-28 NOTE — Telephone Encounter (Signed)
Dr Raymondo Band called requested a Hospital f/u for pt with Dr Alfonse Spruce on 12/7 was given @3 :15

## 2021-04-28 NOTE — TOC Transition Note (Signed)
Transition of Care [facility]) - CM/SW Discharge Note   Patient Details  Name: Jose Hansen MRN: [id number] Date of Birth: Aug 25, 1970  [facility]) CM/SW Contact:  Emeterio Reeve, LCSW Phone Number: 04/28/2021, 12:48 PM   Clinical Narrative:     Per MD patient ready for DC to [facility] living [facility] rehab. RN, patient, patient's family, and facility notified of DC. Discharge Summary and FL2 sent to facility. DC packet on chart. Insurance Josem Kaufmann was not required and pt is covid negative. Ambulance transport requested for patient.    RN to call report to [phone]. Room 309  CSW will sign off for now as social work intervention is no longer needed. Please consult [place] again if new needs arise.   Final next level of care: Skilled Nursing Facility Barriers to Discharge: Barriers Resolved   Patient Goals and CMS Choice Patient states their goals for this hospitalization and ongoing recovery are:: To get better CMS Medicare.gov Compare Post Acute Care list provided to:: Patient Choice offered to / list presented to : Patient  Discharge Placement              Patient chooses bed at: [facility] Patient to be transferred to facility by: [facility] Name of family member notified: Pt A&O x4 Patient and family notified of of transfer: 04/28/21  Discharge Plan and Services                                     Social Determinants of Health ([place]) Interventions     Readmission Risk Interventions Readmission Risk Prevention Plan 02/12/2021 02/12/2021 01/26/2021  Transportation Screening Complete Complete Complete  PCP or Specialist Appt within 5-7 Days - Complete Complete  Home Care Screening - Complete Complete  Medication Review (RN CM) - Complete Complete  Some recent data might be hidden

## 2021-04-28 NOTE — Progress Notes (Signed)
Occupational Therapy Treatment Patient Details Name: Jose Hansen MRN: [id number] DOB: [birth year] Today's Date: 04/28/2021   History of present illness 50 yo male presents to ED on 11/16 with severe acute on chronic left lower extremity pain.  S/p L AKA on 11/17. PMH including HTN, HLD, severe PVD s/p left femoral to posterior tibial artery bypass with now graft occlusion with no further revascularization options, right AKA, and chronic right common femoral DVT on Eliquis.   OT comments  Pt is progressing towards OT goals. During session, pt able to perform UB and LB dressing with setup A while sitting in bed. Pt fearful of falling in current w/c and would benefit from assessment/fitting for more appropriate w/c. Plans to d/c to SNF for this evening and excited to progress with rehab. Will continue to follow acutely.   Recommendations for follow up therapy are one component of a multi-disciplinary discharge planning process, led by the attending physician.  Recommendations may be updated based on patient status, additional functional criteria and insurance authorization.    Follow Up Recommendations  Skilled nursing-short term rehab (<3 hours/day)    Assistance Recommended at Discharge Intermittent Supervision/Assistance  Equipment Recommendations  BSC/3in1;Wheelchair (measurements OT);Wheelchair cushion (measurements OT) (drop arm)    Recommendations for Other Services      Precautions / Restrictions Precautions Precautions: Fall Precaution Comments: bil AKAs Restrictions Weight Bearing Restrictions: Yes LLE Weight Bearing: Non weight bearing Other Position/Activity Restrictions: patient has prior R AKA       Mobility Bed Mobility Overal bed mobility: Modified Independent                  Transfers                   General transfer comment: Requesting to stay in bed     Balance Overall balance assessment: Needs assistance Sitting-balance support:  Bilateral upper extremity supported Sitting balance-Leahy Scale: Fair                                     ADL either performed or assessed with clinical judgement   ADL Overall ADL's : Needs assistance/impaired                 Upper Body Dressing : Set up;Sitting   Lower Body Dressing: Set up;Sitting/lateral leans                      Extremity/Trunk Assessment              Vision       Perception     Praxis      Cognition Arousal/Alertness: Awake/alert Behavior During Therapy: WFL for tasks assessed/performed Overall Cognitive Status: Within Functional Limits for tasks assessed                                            Exercises     Shoulder Instructions       General Comments      Pertinent Vitals/ Pain       Pain Assessment: Faces Faces Pain Scale: Hurts whole lot Pain Location: L residual limb Pain Descriptors / Indicators: Grimacing;Guarding;Sore Pain Intervention(s): Monitored during session  Home Living  Prior Functioning/Environment              Frequency  Min 3X/week        Progress Toward Goals  OT Goals(current goals can now be found in the care plan section)  Progress towards OT goals: Progressing toward goals  Acute Rehab OT Goals Patient Stated Goal: Rehab then home OT Goal Formulation: With patient Time For Goal Achievement: 05/03/21 Potential to Achieve Goals: Good ADL Goals Pt Will Perform Lower Body Dressing: with modified independence;sitting/lateral leans Pt Will Transfer to Toilet: with modified independence;bedside commode;with transfer board;anterior/posterior transfer Pt Will Perform Toileting - Clothing Manipulation and hygiene: with modified independence;sitting/lateral leans  Plan Discharge plan needs to be updated;Frequency remains appropriate    Co-evaluation                 AM-PAC OT "6  Clicks" Daily Activity     Outcome Measure   Help from another person eating meals?: None Help from another person taking care of personal grooming?: A Little Help from another person toileting, which includes using toliet, bedpan, or urinal?: A Little Help from another person bathing (including washing, rinsing, drying)?: A Little Help from another person to put on and taking off regular upper body clothing?: A Little Help from another person to put on and taking off regular lower body clothing?: A Little 6 Click Score: 19    End of Session    OT Visit Diagnosis: Unsteadiness on feet (R26.81);Other abnormalities of gait and mobility (R26.89);Muscle weakness (generalized) (M62.81);Pain Pain - Right/Left: Left Pain - part of body: Leg   Activity Tolerance Patient tolerated treatment well   Patient Left in chair;with call bell/phone within reach;with nursing/sitter in room   Nurse Communication Mobility status        Time: 5038-8828 OT Time Calculation (min): 16 min  Charges: OT General Charges $OT Visit: 1 Visit OT Treatments $Self Care/Home Management : 8-22 mins  Ansar Skoda C, OT/L  Acute Rehab [place] 04/28/2021, 2:14 PM

## 2021-04-28 NOTE — Discharge Instructions (Signed)
Dear Mr. Jose Hansen,  It was a pleasure to take care of you. You were hospitalized for worsening of your peripheral vascular disease, which ultimately required your left leg amputation. Please continue to take your eliquis 5 mg twice daily and lipitor 40 mg daily. You will need to follow up with the vascular surgeons to get your staples removed.   For pain, you can take your oxycontin 30 mg twice daily, and oxycodone 15 mg every 6 hours. Please follow up with Dr. Alfonse Spruce at the Internal medicine center next week, regarding tapering off of these medications and for a hospital follow up.  Good luck with rehab! The social workers at the rehab facility can help coordinate getting your electric wheelchair, and this can also be discussed with Dr. Alfonse Spruce at your appt next week.   If you have any questions or concerns, call our clinic at [phone] or after hours call [phone] and ask for the internal medicine resident on call.

## 2021-04-28 NOTE — Social Work (Signed)
  CSW gave pt admission packet from [place] to fill out.  Emeterio Reeve, LCSW Clinical Social Worker

## 2021-04-29 ENCOUNTER — Non-Acute Institutional Stay (SKILLED_NURSING_FACILITY): Payer: Medicare Other | Admitting: Adult Health

## 2021-04-29 DIAGNOSIS — I82591 Chronic embolism and thrombosis of other specified deep vein of right lower extremity: Secondary | ICD-10-CM | POA: Diagnosis not present

## 2021-04-29 DIAGNOSIS — Z89612 Acquired absence of left leg above knee: Secondary | ICD-10-CM | POA: Diagnosis not present

## 2021-04-29 DIAGNOSIS — F339 Major depressive disorder, recurrent, unspecified: Secondary | ICD-10-CM | POA: Diagnosis not present

## 2021-04-29 DIAGNOSIS — D62 Acute posthemorrhagic anemia: Secondary | ICD-10-CM | POA: Diagnosis not present

## 2021-04-30 ENCOUNTER — Encounter: Payer: Self-pay | Admitting: Internal Medicine

## 2021-04-30 ENCOUNTER — Non-Acute Institutional Stay (SKILLED_NURSING_FACILITY): Payer: Medicare Other | Admitting: Internal Medicine

## 2021-04-30 ENCOUNTER — Encounter: Payer: Self-pay | Admitting: Adult Health

## 2021-04-30 DIAGNOSIS — E782 Mixed hyperlipidemia: Secondary | ICD-10-CM | POA: Diagnosis not present

## 2021-04-30 DIAGNOSIS — Z9189 Other specified personal risk factors, not elsewhere classified: Secondary | ICD-10-CM | POA: Diagnosis not present

## 2021-04-30 DIAGNOSIS — Z89612 Acquired absence of left leg above knee: Secondary | ICD-10-CM | POA: Diagnosis not present

## 2021-04-30 DIAGNOSIS — Z862 Personal history of diseases of the blood and blood-forming organs and certain disorders involving the immune mechanism: Secondary | ICD-10-CM | POA: Insufficient documentation

## 2021-04-30 DIAGNOSIS — K219 Gastro-esophageal reflux disease without esophagitis: Secondary | ICD-10-CM | POA: Diagnosis not present

## 2021-04-30 DIAGNOSIS — R7303 Prediabetes: Secondary | ICD-10-CM

## 2021-04-30 NOTE — Assessment & Plan Note (Addendum)
Triggers for reflux discussed which would include "aspirin family" (not Tylenol), alcohol, peppermint, tobacco products, and caffeine (coffee, tea,cola, chocolate).  PPI therapy will be intensified if he continues to have progressive dyspepsia.

## 2021-04-30 NOTE — Assessment & Plan Note (Signed)
LDL has improved with high-dose atorvastatin but still is not at goal of at least less than 100.  Generic rosuvastatin may be more effective.  Triglycerides are 378 indicate dietary issues which could be addressed by PCP/Nutritionist

## 2021-04-30 NOTE — Assessment & Plan Note (Addendum)
Chronic Pain Clinic management recommended if extremely high opiod dose can not be weaned.

## 2021-04-30 NOTE — Assessment & Plan Note (Signed)
Risk of long-term opioids discussed with the patient.  At present he is having some phantom pain and pruritus.

## 2021-04-30 NOTE — Progress Notes (Signed)
NURSING HOME LOCATION:  [facility] ROOM NUMBER: 309   CODE STATUS:  Full Code  PCP:  [name] MD  This is a comprehensive admission note to this SNFperformed on this date less than 30 days from date of admission. Included are preadmission medical/surgical history; reconciled medication list; family history; social history and comprehensive review of systems.  Corrections and additions to the records were documented. Comprehensive physical exam was also performed. Additionally a clinical summary was entered for each active diagnosis pertinent to this admission in the Problem List to enhance continuity of care.  HPI: He was hospitalized 11/16 - 04/28/2021 with ischemic left lower extremity in the context of longstanding peripheral arterial disease and history of recurrent clotting episodes.  He presented with acute on chronic left leg pain described as severe and stabbing.  The pain has been progressive since his last [facility] visit on 10/26 despite Lyrica and Percocet. Left AKA was performed on 11/17 with no postop complications.  He was placed on a pump for postop pain control with subsequent weaning.  Various pain regimens were assessed including OxyContin and oxycodone but he still required Dilaudid most days.  Finally a regimen of OxyContin 30 mg twice daily and oxycodone 15 mg every 6 hours was initiated.  This was to be reassessed by Dr. Alfonse Spruce as OP 12/7. Power wheelchair was ordered and need documented .Chanda Busing, PT at [facility] will need to be contacted to procure the wheelchair while at the SNF for rehab. At admission 11/16 H/H was 13/41.2 with  MCV of 25.6, minimal hypochromia.  Postop nadir H/H was 7.6/25.1 on 11/21.  This was symptomatic associated with dizziness and lightheadedness.  He received 1 unit of packed cells.  At discharge H/H was 10/32.  He exhibited stable CKD stage II with a final creatinine 1.26 and GFR greater than 60.  Lipid panel revealed an  LDL of 159 and triglycerides of 378.  Glucoses ranged from 97 up to 202; A1c was prediabetic at 5.6%. He was felt to be clinically stable for discharge to the SNF to continue rehab.  Past medical and surgical history: Includes history of recurrent clots involving the heart as well as right upper extremity and both lower extremities.  Medical history includes "clotting disorder" without further elaboration.  He indicates that he was seen by a Hematologist in [facility] because of at least 8 episodes of clotting.  Other diagnoses include anxiety/depression, bipolar disorder, and essential hypertension. Surgeries and procedures include right BKA in 2018; femoral-tibial bypass prosthetic grafting; and wound debridement in September of this year.  Social history: He smoked from age 38 to age 29 up to half a pack per day.  He mainly smoked "socially" when out with friends.  He no longer drinks alcohol.  Family history: He denies any family history of clotting disorder.   Review of systems: As noted he states he has had at least 8 episodes of clotting.  He indicates that prior to the intracardiac clot that he had been on Eliquis once a day. This was subsequently increased to twice a day.  He describes postnasal drainage with associated cough especially at night when supine.  He denies any significant extrinsic symptoms.  He states that he has a "bad heartburn" which is also worse supine and improved sitting up and with burping.  Pepcid has been prescribed.  He describes intermittent numbness of the fingers of the right hand.  He also describes pain in the right upper  extremity at the shoulder level in the context of having been in a wheelchair for 5 years with repetitive use of this extremity.  He also describes anxiety and states that sometimes the room is "closing in".  He states that a Psychiatrist diagnosed schizophrenia while he was living in [place] and prescribed Seroquel and Cymbalta.  He  describes some phantom pain and pruritus in the missing left lower extremity below the knee.  Constitutional: No fever, significant weight change, fatigue  Eyes: No redness, discharge, pain, vision change ENT/mouth: No purulent discharge, earache, change in hearing, sore throat  Cardiovascular: No chest pain, palpitations, paroxysmal nocturnal dyspnea, claudication, edema  Respiratory: No hemoptysis, DOE, significant snoring, apnea  Gastrointestinal: No dysphagia, abdominal pain, nausea /vomiting, rectal bleeding, melena, change in bowels Genitourinary: No dysuria, hematuria, pyuria, incontinence, nocturia Dermatologic: No rash, pruritus, change in appearance of skin Neurologic: No recurrent dizziness, headache, syncope, seizures Psychiatric: No significant  insomnia, anorexia Endocrine: No change in hair/skin/nails, excessive thirst, excessive hunger, excessive urination  Hematologic/lymphatic: No significant bruising, lymphadenopathy, abnormal bleeding Allergy/immunology: No itchy/watery eyes, significant sneezing, urticaria, angioedema  Physical exam:  Pertinent or positive findings: He is very polite and soft-spoken.  Other than BLE amputations exam was surprisingly negative.  General appearance: Adequately nourished; no acute distress, increased work of breathing is present.   Lymphatic: No lymphadenopathy about the head, neck, axilla. Eyes: No conjunctival inflammation or lid edema is present. There is no scleral icterus. Ears:  External ear exam shows no significant lesions or deformities.   Nose:  External nasal examination shows no deformity or inflammation. Nasal mucosa are pink and moist without lesions, exudates Oral exam: Lips and gums are healthy appearing.There is no oropharyngeal erythema or exudate. Neck:  No thyromegaly, masses, tenderness noted.    Heart:  Normal rate and regular rhythm. S1 and S2 normal without gallop, murmur, click, rub.  Lungs: Chest clear to  auscultation without wheezes, rhonchi, rales, rubs. Abdomen: Bowel sounds are normal.  Abdomen is soft and nontender with no organomegaly, hernias, masses. GU: Deferred  Extremities:  No cyanosis, clubbing. Neurologic exam:  Balance, Rhomberg, finger to nose testing could not be completed due to clinical state Skin: Warm & dry w/o tenting. No significant lesions or rash.  See clinical summary under each active problem in the Problem List with associated updated therapeutic plan

## 2021-04-30 NOTE — Patient Instructions (Signed)
See assessment and plan under each diagnosis in the problem list and acutely for this visit 

## 2021-04-30 NOTE — Assessment & Plan Note (Addendum)
Glucose range as inpatient ranged from 97 up to 202; A1c was prediabetic at 5.6%.  Nutritionist can counsel him as to dietary sources of triglycerides such as high fructose corn syrup containing foods and beverages to help prevent frank diabetes.

## 2021-04-30 NOTE — Assessment & Plan Note (Signed)
I recommended that he obtain th results of the Hematology evaluation completed in [facility] to rule out any predisposition to coagulopathy which Eliquis would not optimally cover.

## 2021-04-30 NOTE — Progress Notes (Signed)
Location:  [place] Room Number: 811-B Place of Service:  SNF (31) Provider:  Durenda Age, DNP, FNP-BC  Patient Care Team: [name], MD as PCP - General (Internal Medicine) Drema Pry as Social Worker [place], Cleophus Molt as Social Worker  Extended Emergency Contact Information Primary Emergency Contact: Spradley,BERNARD Address: [address]          Kathyrn Lass Mobile Phone: [phone] Relation: Brother Secondary Emergency Contact: Kaing,Kim Mobile Phone: [phone] Relation: Other Preferred language: English Interpreter needed? No  Code Status:  Full Code   Goals of care: Advanced Directive information Advanced Directives 05/06/2021  Does Patient Have a Medical Advance Directive? No  Would patient like information on creating a medical advance directive? No - Patient declined     Chief Complaint  Patient presents with   Hospitalization Follow-up    Hospital Follow Up.    HPI:  Pt is a 50 y.o. male who was admitted to [facility] on 04/28/2021 post hospital admission 04/14/2021 to 04/28/2021.  He has a PMH of hypertension, hyperlipidemia and severe PVD/PAD S/P right AKA.  He has been having progressively worsening stabbing chronic pain on his left leg.  He has been taking Lyrica and Percocet at home without relief of pain.  He has a history of previous right AKA 5 years ago and "femoral to posterior tibial artery bypass with prosthetic graft in August 2022.  He was scheduled for a left leg amputation on 12/03 with Vascular.  Vascular surgery was consulted and recommended no further imaging and for the patient to undergo an amputation.  He had left AKA with vascular surgery on 11/17.  He was initially placed on PCA pump for postop pain control, however, he was weaned off.  He was weaned off Dilaudid after patient was put on OxyContin 30 mg twice a day and oxycodone 15 mg every 6 hours.  He was seen in  his room today. He does not have clothes. He will be referred to Education officer, museum.   Past Medical History:  Diagnosis Date   Anxiety    Atypical chest pain 01/13/2021   Clotting disorder ([place])    Depression    H/O blood clots    Hypertension    Left leg weakness 11/24/2020   Past Surgical History:  Procedure Laterality Date   ABDOMINAL AORTOGRAM W/LOWER EXTREMITY N/A 01/18/2021   Procedure: ABDOMINAL AORTOGRAM W/LOWER EXTREMITY;  Surgeon: Waynetta Sandy, MD;  Location: [facility];  Service: Cardiovascular;  Laterality: N/A;   AMPUTATION Left 04/15/2021   Procedure: LEFT ABOVE KNEE AMPUTATION;  Surgeon: Cherre Robins, MD;  Location: [facility];  Service: Vascular;  Laterality: Left;   BELOW KNEE LEG AMPUTATION Right 2018   FEMORAL-TIBIAL BYPASS GRAFT Left 01/20/2021   Procedure: BYPASS GRAFT FEMORAL-PT ARTERY LEFT USING 6 MM X 80 CM GORE PROPATEN VASCULAR GRAFT REMOVABLE RING;  Surgeon: Waynetta Sandy, MD;  Location: [place];  Service: Vascular;  Laterality: Left;   WOUND DEBRIDEMENT Left 02/09/2021   Procedure: LEFT LEG WASHOUT AND WOUND CLOSURE;  Surgeon: Waynetta Sandy, MD;  Location: [place];  Service: Vascular;  Laterality: Left;    Allergies  Allergen Reactions   Contrast Media [Iodinated Diagnostic Agents] Nausea Only    NOT AN ALLERGY. ONLY NAUSEA. 12/11/20  Newly reported by [facility] on 12/04/20.    Outpatient Encounter Medications as of 04/29/2021  Medication Sig   acetaminophen (TYLENOL) 325 MG tablet Take  2 tablets (650 mg total) by mouth every 6 (six) hours as needed for moderate pain or mild pain.   apixaban (ELIQUIS) 5 MG TABS tablet Take 1 tablet (5 mg total) by mouth 2 (two) times daily.   aspirin EC 81 MG tablet Take 81 mg by mouth daily. Swallow whole.   atorvastatin (LIPITOR) 40 MG tablet Take 1 tablet (40 mg total) by mouth at bedtime.   DULoxetine (CYMBALTA) 60 MG capsule Take 1 capsule (60 mg total) by mouth daily.    famotidine (PEPCID) 20 MG tablet Take 1 tablet (20 mg total) by mouth daily.   methocarbamol 1000 MG TABS Take 1,000 mg by mouth 3 (three) times daily for 10 days.   [EXPIRED] oxyCODONE (OXYCONTIN) 20 mg 12 hr tablet Take 30 mg by mouth every 12 (twelve) hours. For pain x 7 days   [EXPIRED] oxyCODONE (ROXICODONE) 15 MG immediate release tablet Take 1 tablet (15 mg total) by mouth every 6 (six) hours for 7 days.   polyethylene glycol (MIRALAX / GLYCOLAX) 17 g packet Take 17 g by mouth 2 (two) times daily.   pregabalin (LYRICA) 150 MG capsule Take 1 capsule (150 mg total) by mouth 3 (three) times daily.   QUEtiapine (SEROQUEL) 50 MG tablet Take 3 tablets (150 mg total) by mouth at bedtime.   senna-docusate (SENOKOT-S) 8.6-50 MG tablet Take 1 tablet by mouth at bedtime.   [DISCONTINUED] ibuprofen (ADVIL) 400 MG tablet Take 1 tablet (400 mg total) by mouth every 6 (six) hours for 7 days.   [DISCONTINUED] oxyCODONE 30 MG 12 hr tablet Take 1 tablet (30 mg total) by mouth every 12 (twelve) hours for 7 days.   No facility-administered encounter medications on file as of 04/29/2021.    Review of Systems  GENERAL: No change in appetite, no fatigue, no weight changes, no fever, chills  MOUTH and THROAT: Denies oral discomfort, gingival pain or bleeding RESPIRATORY: no cough, SOB, DOE, wheezing, hemoptysis CARDIAC: No chest pain, edema or palpitations GI: No abdominal pain, diarrhea, constipation, heart burn, nausea or vomiting GU: Denies dysuria, frequency, hematuria, incontinence, or discharge NEUROLOGICAL: Denies dizziness, syncope, numbness, or headache PSYCHIATRIC: Denies feelings of depression or anxiety. No report of hallucinations, insomnia, paranoia, or agitation   Immunization History  Administered Date(s) Administered   Influenza,inj,Quad PF,6+ Mos 01/28/2021   Pertinent  Health Maintenance Due  Topic Date Due   COLONOSCOPY (Pts 45-73yrs Insurance coverage will need to be confirmed)   Never done   INFLUENZA VACCINE  Completed   Fall Risk 04/26/2021 04/26/2021 04/27/2021 04/28/2021 04/28/2021  Falls in the past year? - - - - -  Was there an injury with Fall? - - - - -  Fall Risk Category Calculator - - - - -  Fall Risk Category - - - - -  Patient Fall Risk Level Moderate fall risk Moderate fall risk Moderate fall risk Moderate fall risk Moderate fall risk  Patient at Risk for Falls Due to - - - - -  Fall risk Follow up - - - - -     Vitals:   04/30/21 1054  BP: 140/90  Pulse: 72  Resp: (!) 21  Temp: 98.6 F (37 C)  Weight: 207 lb (93.9 kg)  Height: 5\' 11"  (1.803 m)   Body mass index is 28.87 kg/m.  Physical Exam  GENERAL APPEARANCE: Well nourished. In no acute distress.  SKIN:  Left AKA surgical stump with dressing, dry  MOUTH and THROAT: Lips are without lesions.  Oral mucosa is moist and without lesions.  RESPIRATORY: Breathing is even & unlabored, BS CTAB CARDIAC: RRR, no murmur,no extra heart sounds GI: Abdomen soft, normal BS, no masses, no tenderness EXTREMITIES:  old Right AKA healed, new left AKA stump with staples, dry  NEUROLOGICAL: There is no tremor. Speech is clear. Alert and oriented X 3. PSYCHIATRIC:  Affect and behavior are appropriate  Labs reviewed: Recent Labs    11/24/20 2125 12/03/20 1202 04/21/21 0119 04/24/21 0050 04/27/21 0042  NA  --    < > 138 136 136  K  --    < > 3.5 3.7 4.0  CL  --    < > 106 106 105  CO2  --    < > 26 23 24   GLUCOSE  --    < > 147* 127* 100*  BUN  --    < > 9 10 10   CREATININE  --    < > 1.08 1.18 1.26*  CALCIUM  --    < > 8.5* 8.4* 8.8*  MG 2.2  --   --   --   --   PHOS 4.7*  --   --   --   --    < > = values in this interval not displayed.   Recent Labs    02/18/21 2121 02/21/21 1708 04/14/21 2124  AST 20 18 20   ALT 25 21 24   ALKPHOS 98 97 67  BILITOT 0.6 0.6 0.9  PROT 6.4* 7.3 6.7  ALBUMIN 3.5 3.9 3.7   Recent Labs    02/10/21 0817 02/18/21 2121 02/21/21 1708 04/14/21 1314  04/14/21 2124 04/22/21 0056 04/24/21 0050 04/27/21 0042  WBC 13.8* 7.8   < > 9.1   < > 7.5 6.8 7.7  NEUTROABS 11.4* 4.3  --  6.5  --   --   --   --   HGB 11.2* 11.2*   < > 14.5   < > 9.6* 9.3* 10.0*  HCT 34.6* 35.4*   < > 46.7   < > 29.7* 28.8* 32.0*  MCV 84.2 84.5   < > 82.4   < > 82.3 82.5 83.6  PLT 312 236   < > 340   < > 330 329 358   < > = values in this interval not displayed.   Lab Results  Component Value Date   TSH 2.337 11/24/2020   Lab Results  Component Value Date   HGBA1C 5.6 04/15/2021   Lab Results  Component Value Date   CHOL 266 (H) 04/15/2021   HDL 31 (L) 04/15/2021   LDLCALC 159 (H) 04/15/2021   TRIG 378 (H) 04/15/2021   CHOLHDL 8.6 04/15/2021    Significant Diagnostic Results in last 30 days:  No results found.  Assessment/Plan  1. S/P AKA (above knee amputation), left (HCC) -   Had PAD, S/P failed left common femoral to posterior tibial artery bypass  -   Follow-up with vascular surgery -    Continue Lyrica 150 mg 1 capsule 3 times a day, methocarbamol 1000 mg 3 times a day, oxycodone IR 15 mg 1 tab every 6 hours PRN and oxycodone HCl ER 20 mg take 1.5 tab= 30 mg every 12 hours -    For PT and OT, for therapeutic strengthening exercises  2. Acute blood loss anemia Lab Results  Component Value Date   WBC 7.7 04/27/2021   HGB 10.0 (L) 04/27/2021   HCT 32.0 (L) 04/27/2021   MCV 83.6  04/27/2021   PLT 358 04/27/2021   -   Stable  3. Chronic deep vein thrombosis (DVT) of other vein of right lower extremity (HCC) -   Stable, continue Eliquis  4. Major depression, recurrent, chronic (HCC) -    Continue duloxetine 60 mg 1 capsule daily, quetiapine 50 mg give 3 tabs = 150 mg at bedtime     Family/ staff Communication: Discussed plan of care with resident and charge nurse.  Labs/tests ordered:    None  Goals of care:   Short-term care   Durenda Age, DNP, MSN, FNP-BC [facility] [phone]  (Monday-Friday 8:00 a.m. - 5:00 p.m.) [phone] (after hours)

## 2021-05-03 ENCOUNTER — Telehealth: Payer: Medicare Other | Admitting: Licensed Clinical Social Worker

## 2021-05-03 ENCOUNTER — Telehealth: Payer: Self-pay | Admitting: Licensed Clinical Social Worker

## 2021-05-03 NOTE — Telephone Encounter (Signed)
  Care Management   Follow Up Note   05/03/2021 Name: Jose Hansen MRN: [id number] DOB: 1970-07-29   Referred by: [name], MD Reason for referral : No chief complaint on file.   An unsuccessful telephone outreach was attempted today. The patient was referred to the case management team for assistance with care management and care coordination.   Follow Up Plan: The care management team will reach out to the patient again over the next 30 days.   [name], [place]  Social Worker [facility] Care Management  [phone]

## 2021-05-04 ENCOUNTER — Ambulatory Visit: Payer: Medicare Other | Admitting: Physical Therapy

## 2021-05-05 ENCOUNTER — Encounter: Payer: Medicare Other | Admitting: Student

## 2021-05-06 ENCOUNTER — Non-Acute Institutional Stay (SKILLED_NURSING_FACILITY): Payer: Medicare Other | Admitting: Adult Health

## 2021-05-06 ENCOUNTER — Encounter: Payer: Self-pay | Admitting: Adult Health

## 2021-05-06 DIAGNOSIS — Z89612 Acquired absence of left leg above knee: Secondary | ICD-10-CM

## 2021-05-06 DIAGNOSIS — G8928 Other chronic postprocedural pain: Secondary | ICD-10-CM

## 2021-05-06 MED ORDER — OXYCODONE HCL ER 30 MG PO T12A: 30.0000 mg | EXTENDED_RELEASE_TABLET | Freq: Two times a day (BID) | ORAL | 0 refills | Status: DC

## 2021-05-06 MED ORDER — OXYCODONE HCL 10 MG PO TABS: 10.0000 mg | ORAL_TABLET | Freq: Four times a day (QID) | ORAL | 0 refills | Status: DC | PRN

## 2021-05-06 NOTE — Progress Notes (Signed)
Location:  [place] Room Number: [place] of Service:  SNF (31) Provider:  Durenda Age, DNP, FNP-BC  Patient Care Team: [name], MD as PCP - General (Internal Medicine) Drema Pry as Social Worker [place], Cleophus Molt as Social Worker  Extended Emergency Contact Information Primary Emergency Contact: Kotula,BERNARD Address: [place]          Kathyrn Lass Mobile Phone: [phone] Relation: Brother Secondary Emergency Contact: Kaing,Kim Mobile Phone: [phone] Relation: Other Preferred language: English Interpreter needed? No  Code Status:  FULL CODE  Goals of care: Advanced Directive information Advanced Directives 05/06/2021  Does Patient Have a Medical Advance Directive? No  Would patient like information on creating a medical advance directive? No - Patient declined     Chief Complaint  Patient presents with   Acute Visit    Acute Visit for Pain Management.    HPI:  Pt is a 50 y.o. male seen today for an acute visit.  He is currently having short-term rehabilitation at [facility] post hospital admission 04/14/2021 to 04/28/2021 S/P left AKA due to PAD, S/P failed left common femoral to posterior tibial artery bypass. Left AKA was done on 04/15/21. He complained today that he has 10/10 pain on his left AKA stump. Staples on left stump intact, no redness. Wound is dry. He is currently takes Lyrica 150 mg 1 capsule PO TID, Methocarbamol 1,000 mg TID, Oxycodone IR 15 mg every 6 hours PRN and Oxycodone ER 20 mg 1.5 tabs = 30 mg every 12 hours for pain.    Past Medical History:  Diagnosis Date   Anxiety    Atypical chest pain 01/13/2021   Clotting disorder ([place])    Depression    H/O blood clots    Hypertension    Left leg weakness 11/24/2020   Past Surgical History:  Procedure Laterality Date   ABDOMINAL AORTOGRAM W/LOWER EXTREMITY N/A 01/18/2021   Procedure: ABDOMINAL AORTOGRAM W/LOWER EXTREMITY;  Surgeon:  Waynetta Sandy, MD;  Location: [place] CV LAB;  Service: Cardiovascular;  Laterality: N/A;   AMPUTATION Left 04/15/2021   Procedure: LEFT ABOVE KNEE AMPUTATION;  Surgeon: Cherre Robins, MD;  Location: [place];  Service: Vascular;  Laterality: Left;   BELOW KNEE LEG AMPUTATION Right 2018   FEMORAL-TIBIAL BYPASS GRAFT Left 01/20/2021   Procedure: BYPASS GRAFT FEMORAL-PT ARTERY LEFT USING 6 MM X 80 CM GORE PROPATEN VASCULAR GRAFT REMOVABLE RING;  Surgeon: Waynetta Sandy, MD;  Location: [place];  Service: Vascular;  Laterality: Left;   WOUND DEBRIDEMENT Left 02/09/2021   Procedure: LEFT LEG WASHOUT AND WOUND CLOSURE;  Surgeon: Waynetta Sandy, MD;  Location: [place];  Service: Vascular;  Laterality: Left;    Allergies  Allergen Reactions   Contrast Media [Iodinated Diagnostic Agents] Nausea Only    NOT AN ALLERGY. ONLY NAUSEA. 12/11/20  Newly reported by [facility] on 12/04/20.    Outpatient Encounter Medications as of 05/06/2021  Medication Sig   acetaminophen (TYLENOL) 325 MG tablet Take 2 tablets (650 mg total) by mouth every 6 (six) hours as needed for moderate pain or mild pain.   apixaban (ELIQUIS) 5 MG TABS tablet Take 1 tablet (5 mg total) by mouth 2 (two) times daily.   aspirin EC 81 MG tablet Take 81 mg by mouth daily. Swallow whole.   atorvastatin (LIPITOR) 40 MG tablet Take 1 tablet (40 mg total) by mouth at bedtime.   DULoxetine (CYMBALTA) 60 MG capsule  Take 1 capsule (60 mg total) by mouth daily.   famotidine (PEPCID) 20 MG tablet Take 1 tablet (20 mg total) by mouth daily.   methocarbamol 1000 MG TABS Take 1,000 mg by mouth 3 (three) times daily for 10 days.   polyethylene glycol (MIRALAX / GLYCOLAX) 17 g packet Take 17 g by mouth 2 (two) times daily.   pregabalin (LYRICA) 150 MG capsule Take 1 capsule (150 mg total) by mouth 3 (three) times daily.   Pseudoephedrine-guaiFENesin (CHEST CONGESTION RELIEF D PO) Take by mouth. TAKE 10 ML BY  MOUTH TWICE A DAY FOR 14 DAYS FOR SORE THROAT/COUGH   QUEtiapine (SEROQUEL) 50 MG tablet Take 3 tablets (150 mg total) by mouth at bedtime.   senna-docusate (SENOKOT-S) 8.6-50 MG tablet Take 1 tablet by mouth at bedtime.   [EXPIRED] oxyCODONE (OXYCONTIN) 20 mg 12 hr tablet Take 30 mg by mouth every 12 (twelve) hours. For pain x 7 days   No facility-administered encounter medications on file as of 05/06/2021.    Review of Systems  GENERAL: No change in appetite, no fatigue, no weight changes, no fever or chills  MOUTH and THROAT: Denies oral discomfort, gingival pain or bleeding RESPIRATORY: no cough, SOB, DOE, wheezing, hemoptysis CARDIAC: No chest pain, edema or palpitations GI: No abdominal pain, diarrhea, constipation, heart burn, nausea or vomiting GU: Denies dysuria, frequency, hematuria, incontinence, or discharge NEUROLOGICAL: Denies dizziness, syncope, numbness, or headache PSYCHIATRIC: Denies feelings of depression or anxiety. No report of hallucinations, insomnia, paranoia, or agitation   Immunization History  Administered Date(s) Administered   Influenza,inj,Quad PF,6+ Mos 01/28/2021   Pertinent  Health Maintenance Due  Topic Date Due   COLONOSCOPY (Pts 45-19yrs Insurance coverage will need to be confirmed)  Never done   INFLUENZA VACCINE  Completed   Fall Risk 04/26/2021 04/26/2021 04/27/2021 04/28/2021 04/28/2021  Falls in the past year? - - - - -  Was there an injury with Fall? - - - - -  Fall Risk Category Calculator - - - - -  Fall Risk Category - - - - -  Patient Fall Risk Level Moderate fall risk Moderate fall risk Moderate fall risk Moderate fall risk Moderate fall risk  Patient at Risk for Falls Due to - - - - -  Fall risk Follow up - - - - -     Vitals:   05/06/21 1040  BP: 135/80  Pulse: 78  Resp: 20  Temp: (!) 96.3 F (35.7 C)  Weight: 217 lb 9.6 oz (98.7 kg)  Height: 5\' 11"  (1.803 m)   Body mass index is 30.35 kg/m.  Physical Exam  GENERAL  APPEARANCE: Well nourished. In no acute distress. Obese. SKIN:  Left AKA stump with staples, dry and no erythema MOUTH and THROAT: Lips are without lesions. Oral mucosa is moist and without lesions.  RESPIRATORY: Breathing is even & unlabored, BS CTAB CARDIAC: RRR, no murmur,no extra heart sounds, no edema GI: Abdomen soft, normal BS, no masses, no tenderness EXTREMITIES:  Old right AKA and new left AKA stump has staples intact, dry and no erythema NEUROLOGICAL: There is no tremor. Speech is clear. Alert and oriented X 3. PSYCHIATRIC:  Affect and behavior are appropriate  Labs reviewed: Recent Labs    11/24/20 2125 12/03/20 1202 04/21/21 0119 04/24/21 0050 04/27/21 0042  NA  --    < > 138 136 136  K  --    < > 3.5 3.7 4.0  CL  --    < >  106 106 105  CO2  --    < > 26 23 24   GLUCOSE  --    < > 147* 127* 100*  BUN  --    < > 9 10 10   CREATININE  --    < > 1.08 1.18 1.26*  CALCIUM  --    < > 8.5* 8.4* 8.8*  MG 2.2  --   --   --   --   PHOS 4.7*  --   --   --   --    < > = values in this interval not displayed.   Recent Labs    02/18/21 2121 02/21/21 1708 04/14/21 2124  AST 20 18 20   ALT 25 21 24   ALKPHOS 98 97 67  BILITOT 0.6 0.6 0.9  PROT 6.4* 7.3 6.7  ALBUMIN 3.5 3.9 3.7   Recent Labs    02/10/21 0817 02/18/21 2121 02/21/21 1708 04/14/21 1314 04/14/21 2124 04/22/21 0056 04/24/21 0050 04/27/21 0042  WBC 13.8* 7.8   < > 9.1   < > 7.5 6.8 7.7  NEUTROABS 11.4* 4.3  --  6.5  --   --   --   --   HGB 11.2* 11.2*   < > 14.5   < > 9.6* 9.3* 10.0*  HCT 34.6* 35.4*   < > 46.7   < > 29.7* 28.8* 32.0*  MCV 84.2 84.5   < > 82.4   < > 82.3 82.5 83.6  PLT 312 236   < > 340   < > 330 329 358   < > = values in this interval not displayed.   Lab Results  Component Value Date   TSH 2.337 11/24/2020   Lab Results  Component Value Date   HGBA1C 5.6 04/15/2021   Lab Results  Component Value Date   CHOL 266 (H) 04/15/2021   HDL 31 (L) 04/15/2021   LDLCALC 159 (H)  04/15/2021   TRIG 378 (H) 04/15/2021   CHOLHDL 8.6 04/15/2021    Significant Diagnostic Results in last 30 days:  No results found.  Assessment/Plan  1. Other chronic postprocedural pain -   will decrease OxyIR 15 mg Q 6 hours PRN to 10 mg Q 6 hours PRN and continue Oxycontin 30 mg Q 12 hours, Lyrica 150 mg TID -  follow up with pain clinic  2. S/P AKA (above knee amputation), left (HCC) -   had PAD, S/P failed left common femoral to posterior tibial artery bypass -  follow up with vascular surgery -  continue Methocarbamol 1,000 mg  TID    Family/ staff Communication:  Discussed plan of care with resident and charge nurse.  Labs/tests ordered:  None  Goals of care:   Short-term care   Durenda Age, DNP, MSN, FNP-BC [facility] and Adult Medicine [phone] (Monday-Friday 8:00 a.m. - 5:00 p.m.) [phone] (after hours)

## 2021-05-10 ENCOUNTER — Non-Acute Institutional Stay (SKILLED_NURSING_FACILITY): Payer: Medicare Other | Admitting: Adult Health

## 2021-05-10 ENCOUNTER — Encounter: Payer: Self-pay | Admitting: Adult Health

## 2021-05-10 DIAGNOSIS — Z89612 Acquired absence of left leg above knee: Secondary | ICD-10-CM | POA: Diagnosis not present

## 2021-05-10 DIAGNOSIS — F339 Major depressive disorder, recurrent, unspecified: Secondary | ICD-10-CM

## 2021-05-10 NOTE — Progress Notes (Signed)
Location:  [place] Room Number: 706-C Place of Service:  SNF (31) Provider:  Durenda Age, DNP, FNP-BC  Patient Care Team: [name], MD as PCP - General (Internal Medicine) Drema Pry as Social Worker [place], Cleophus Molt as Social Worker  Extended Emergency Contact Information Primary Emergency Contact: Champa,BERNARD Address: [place]          Kathyrn Lass Mobile Phone: [phone] Relation: Brother Secondary Emergency Contact: Kaing,Kim Mobile Phone: [phone] Relation: Other Preferred language: English Interpreter needed? No  Code Status: Full Code   Goals of care: Advanced Directive information Advanced Directives 05/10/2021  Does Patient Have a Medical Advance Directive? No  Would patient like information on creating a medical advance directive? No - Patient declined     Chief Complaint  Patient presents with   Acute Visit    Depression    HPI:  Pt is a 50 y.o. [place] seen today for an acute visit. He is a host-term care resident of [facility]. He has a PMH of hypertension, hyperlipidemia and severe PVD/PAD S/P right AKA.  He was admitted to [facility] on 04/28/2021 for short-term rehabilitation post hospital admission 04/14/2021 to 04/28/2021 for PAD, S/P failed left common femoral to posterior tibial artery bypass and S/P left AKA on 04/15/2021.  He said that he is depressed, has poor appetite and cannot sleep.  He said that when he sleeps he thinks of the hardships he is going through.  He has no plans of hurting himself.  He is noted to be fidgety when talking.  He is currently taking duloxetine 60 mg 1 capsule daily and quetiapine 150 mg at bedtime for major depression.   Past Medical History:  Diagnosis Date   Anxiety    Atypical chest pain 01/13/2021   Clotting disorder ([place])    Depression    H/O blood clots    Hypertension    Left leg weakness 11/24/2020   Past Surgical  History:  Procedure Laterality Date   ABDOMINAL AORTOGRAM W/LOWER EXTREMITY N/A 8/Jose/2022   Procedure: ABDOMINAL AORTOGRAM W/LOWER EXTREMITY;  Surgeon: Waynetta Sandy, MD;  Location: [place] CV LAB;  Service: Cardiovascular;  Laterality: N/A;   AMPUTATION Left 04/15/2021   Procedure: LEFT ABOVE KNEE AMPUTATION;  Surgeon: Cherre Robins, MD;  Location: [place];  Service: Vascular;  Laterality: Left;   BELOW KNEE LEG AMPUTATION Right 2018   FEMORAL-TIBIAL BYPASS GRAFT Left 01/20/2021   Procedure: BYPASS GRAFT FEMORAL-PT ARTERY LEFT USING 6 MM X 80 CM GORE PROPATEN VASCULAR GRAFT REMOVABLE RING;  Surgeon: Waynetta Sandy, MD;  Location: [place];  Service: Vascular;  Laterality: Left;   WOUND DEBRIDEMENT Left 02/09/2021   Procedure: LEFT LEG WASHOUT AND WOUND CLOSURE;  Surgeon: Waynetta Sandy, MD;  Location: [place];  Service: Vascular;  Laterality: Left;    Allergies  Allergen Reactions   Contrast Media [Iodinated Diagnostic Agents] Nausea Only    NOT AN ALLERGY. ONLY NAUSEA. 7/15/Jose  Newly reported by [facility] on 07/08/Jose.    Outpatient Encounter Medications as of 05/10/2021  Medication Sig   acetaminophen (TYLENOL) 325 MG tablet Take 2 tablets (650 mg total) by mouth every 6 (six) hours as needed for moderate pain or mild pain.   apixaban (ELIQUIS) 5 MG TABS tablet Take 1 tablet (5 mg total) by mouth 2 (two) times daily.   aspirin EC 81 MG tablet Take 81 mg by mouth daily. Swallow whole.   atorvastatin (  LIPITOR) 40 MG tablet Take 1 tablet (40 mg total) by mouth at bedtime.   bisacodyl (DULCOLAX) 10 MG suppository If not relieved by MOM, give 10 mg Bisacodyl suppositiory rectally X 1 dose in 24 hours as needed   DULoxetine (CYMBALTA) 60 MG capsule Take 1 capsule (60 mg total) by mouth daily.   famotidine (PEPCID) 20 MG tablet Take 1 tablet (20 mg total) by mouth daily.   guaiFENesin (CHEST CONGESTION RELIEF PO) TAKE 10 ML BY MOUTH TWICE A DAY FOR 14  DAYS FOR SORE THROAT/COUGH   Magnesium Hydroxide (MILK OF MAGNESIA PO) If no BM in 3 days, give 30 cc Milk of Magnesium p.o. x 1 dose in 24 hours as needed (Do not use standing constipation orders for residents with renal failure CFR less than 30. Contact MD for orders)   oxyCODONE 30 MG 12 hr tablet Take 1 tablet (30 mg total) by mouth every 12 (twelve) hours.   Oxycodone HCl 10 MG TABS Take 1 tablet (10 mg total) by mouth every 6 (six) hours as needed.   polyethylene glycol (MIRALAX / GLYCOLAX) 17 g packet Take 17 g by mouth 2 (two) times daily.   pregabalin (LYRICA) 150 MG capsule Take 1 capsule (150 mg total) by mouth 3 (three) times daily.   QUEtiapine (SEROQUEL) 50 MG tablet Take 3 tablets (150 mg total) by mouth at bedtime.   senna-docusate (SENOKOT-S) 8.6-50 MG tablet Take 1 tablet by mouth at bedtime.   Sodium Phosphates (RA SALINE ENEMA RE) If not relieved by Biscodyl suppository, give disposable Saline Enema rectally X 1 dose/24 hrs as needed   [DISCONTINUED] Pseudoephedrine-guaiFENesin (CHEST CONGESTION RELIEF D PO) Take by mouth. TAKE 10 ML BY MOUTH TWICE A DAY FOR 14 DAYS FOR SORE THROAT/COUGH   No facility-administered encounter medications on file as of 05/10/2021.    Review of Systems  GENERAL: poor appetite, no fever or chills  MOUTH and THROAT: Denies oral discomfort, gingival pain or bleeding RESPIRATORY: no cough, SOB, DOE, wheezing, hemoptysis CARDIAC: No chest pain, edema or palpitations GI: No abdominal pain, diarrhea, constipation, heart burn, nausea or vomiting GU: Denies dysuria, frequency, hematuria, incontinence, or discharge NEUROLOGICAL: Denies dizziness, syncope, numbness, or headache PSYCHIATRIC: + feelings of depression  No report of hallucinations, +insomnia,    Immunization History  Administered Date(s) Administered   Influenza,inj,Quad PF,6+ Mos 01/28/2021   Pertinent  Health Maintenance Due  Topic Date Due   COLONOSCOPY (Pts 45-53yrs Insurance  coverage will need to be confirmed)  Never done   INFLUENZA VACCINE  Completed   Fall Risk 04/26/2021 04/26/2021 04/27/2021 04/28/2021 04/28/2021  Falls in the past year? - - - - -  Was there an injury with Fall? - - - - -  Fall Risk Category Calculator - - - - -  Fall Risk Category - - - - -  Patient Fall Risk Level Moderate fall risk Moderate fall risk Moderate fall risk Moderate fall risk Moderate fall risk  Patient at Risk for Falls Due to - - - - -  Fall risk Follow up - - - - -     Vitals:   12/12/Jose 1602  BP: 137/80  Pulse: 60  Resp: 20  Temp: (!) 97.1 F (36.2 C)  Weight: 217 lb 9.6 oz (98.7 kg)  Height: 5\' 11"  (1.803 m)   Body mass index is 30.35 kg/m.  Physical Exam  GENERAL APPEARANCE: Well nourished. In no acute distress.  Obese. SKIN: Left AKA stump dry, no erythema  MOUTH and THROAT: Lips are without lesions. Oral mucosa is moist and without lesions.  RESPIRATORY: Breathing is even & unlabored, BS CTAB CARDIAC: RRR, no murmur,no extra heart sounds, no edema GI: Abdomen soft, normal BS, no masses, no tenderness EXTREMITIES:  Old right AKA NEUROLOGICAL: There is no tremor. Speech is clear. Alert and oriented X 3. PSYCHIATRIC:  sad affect, fidgety  Labs reviewed: Recent Labs    06/28/Jose 2125 07/07/Jose 1202 11/23/Jose 0119 11/26/Jose 0050 11/29/Jose 0042  NA  --    < > 138 136 136  K  --    < > 3.5 3.7 4.0  CL  --    < > 106 106 105  CO2  --    < > 26 23 24   GLUCOSE  --    < > 147* 127* 100*  BUN  --    < > 9 10 10   CREATININE  --    < > 1.08 1.18 1.26*  CALCIUM  --    < > 8.5* 8.4* 8.8*  MG 2.2  --   --   --   --   PHOS 4.7*  --   --   --   --    < > = values in this interval not displayed.   Recent Labs    09/Jose/Jose 2121 09/25/Jose 1708 11/16/Jose 2124  AST 20 18 20   ALT 25 21 24   ALKPHOS 98 97 67  BILITOT 0.6 0.6 0.9  PROT 6.4* 7.3 6.7  ALBUMIN 3.5 3.9 3.7   Recent Labs    09/14/Jose 0817 09/Jose/Jose 2121 09/25/Jose 1708 11/16/Jose 1314  11/16/Jose 2124 11/24/Jose 0056 11/26/Jose 0050 11/29/Jose 0042  WBC 13.8* 7.8   < > 9.1   < > 7.5 6.8 7.7  NEUTROABS 11.4* 4.3  --  6.5  --   --   --   --   HGB 11.2* 11.2*   < > 14.5   < > 9.6* 9.3* 10.0*  HCT 34.6* 35.4*   < > 46.7   < > 29.7* 28.8* 32.0*  MCV 84.2 84.5   < > 82.4   < > 82.3 82.5 83.6  PLT 312 236   < > 340   < > 330 329 358   < > = values in this interval not displayed.   Lab Results  Component Value Date   TSH 2.337 11/24/2020   Lab Results  Component Value Date   HGBA1C 5.6 04/15/2021   Lab Results  Component Value Date   CHOL 266 (H) 04/15/2021   HDL 31 (L) 04/15/2021   LDLCALC 159 (H) 04/15/2021   TRIG 378 (H) 04/15/2021   CHOLHDL 8.6 04/15/2021    Significant Diagnostic Results in last 30 days:  No results found.  Assessment/Plan  1. Major depression, recurrent, chronic (HCC) -Denies plans of hurting himself -  will increase Seroquel from 150mg  to 200 mg at bedtime -   Continue duloxetine 60 mg in the morning -   Psych consult  2. S/P AKA (above knee amputation), left (HCC) -   Had PAD, S/P failed left common femoral to posterior tibial artery bypass -   Continue Lyrica 150 mg 1 capsule 3 times a day, methocarbamol 1000 mg 3 times a day, oxycodone IR 10 mg 1 tab every 6 hours PRN and oxycodone ER 20 mg 1 tab every 12 hours -   Follow-up with vascular surgery -    Continue PT and OT, for therapeutic strengthening exercises  Family/ staff Communication:   Discussed plan of care with resident and charge nurse.  Labs/tests ordered:   None  Goals of care:   Short-term care   Durenda Age, DNP, MSN, FNP-BC [facility] and Adult Medicine [phone] (Monday-Friday 8:00 a.m. - 5:00 p.m.) [phone] (after hours)

## 2021-05-11 ENCOUNTER — Other Ambulatory Visit: Payer: Self-pay

## 2021-05-11 ENCOUNTER — Encounter: Payer: Self-pay | Admitting: Physician Assistant

## 2021-05-11 ENCOUNTER — Ambulatory Visit (INDEPENDENT_AMBULATORY_CARE_PROVIDER_SITE_OTHER): Payer: Medicare Other | Admitting: Physician Assistant

## 2021-05-11 VITALS — BP 116/73 | HR 86 | Temp 98.1°F | Resp 18 | Ht 71.0 in | Wt 217.0 lb

## 2021-05-11 DIAGNOSIS — I739 Peripheral vascular disease, unspecified: Secondary | ICD-10-CM

## 2021-05-11 DIAGNOSIS — Z89612 Acquired absence of left leg above knee: Secondary | ICD-10-CM

## 2021-05-11 DIAGNOSIS — Z89611 Acquired absence of right leg above knee: Secondary | ICD-10-CM

## 2021-05-11 NOTE — Progress Notes (Signed)
POST OPERATIVE OFFICE NOTE    CC:  F/u for surgery  HPI:  This is a 50 y.o. male who is s/p left above knee amputation on 04/15/21 by Dr. Stanford Breed.  He unfortunately has a long complicated history of revascularization attempts on the left lower extremity ultimately failing and resulting in non healing wounds that required amputation. He did well post operatively. His biggest issue  post operatively was pain control. Once he was weaned off of IV pain medication he was discharged to short term rehab at [facility]. He has prior right AKA following multiple vascular interventions that ultimately failed. He is managed on Eliquis 5 mg BID for DVT and Lipitor.   He explains that overall his pain is intermittent sometimes 10/10 but tolerable with is current main medication regimen. She says it is combination of incisional and also phantom pains in AKA stump. His pain is being managed by NP at SNF. He was just seen on 05/06/21 at which time they tapered his pain medications from those he was discharged on from the hospital. He has been referred to the pain clinic for further management going forward. He says his therapy is going well but he is not sure how much longer he will be at [facility].  Allergies  Allergen Reactions   Contrast Media [Iodinated Diagnostic Agents] Nausea Only    NOT AN ALLERGY. ONLY NAUSEA. 12/11/20  Newly reported by [facility] on 12/04/20.    Current Outpatient Medications  Medication Sig Dispense Refill   acetaminophen (TYLENOL) 325 MG tablet Take 2 tablets (650 mg total) by mouth every 6 (six) hours as needed for moderate pain or mild pain.     apixaban (ELIQUIS) 5 MG TABS tablet Take 1 tablet (5 mg total) by mouth 2 (two) times daily. 60 tablet 3   aspirin EC 81 MG tablet Take 81 mg by mouth daily. Swallow whole.     atorvastatin (LIPITOR) 40 MG tablet Take 1 tablet (40 mg total) by mouth at bedtime. 30 tablet 0   bisacodyl (DULCOLAX) 10 MG suppository If not relieved  by MOM, give 10 mg Bisacodyl suppositiory rectally X 1 dose in 24 hours as needed     DULoxetine (CYMBALTA) 60 MG capsule Take 1 capsule (60 mg total) by mouth daily. 30 capsule 2   famotidine (PEPCID) 20 MG tablet Take 1 tablet (20 mg total) by mouth daily. 30 tablet 0   guaiFENesin (CHEST CONGESTION RELIEF PO) TAKE 10 ML BY MOUTH TWICE A DAY FOR 14 DAYS FOR SORE THROAT/COUGH     Magnesium Hydroxide (MILK OF MAGNESIA PO) If no BM in 3 days, give 30 cc Milk of Magnesium p.o. x 1 dose in 24 hours as needed (Do not use standing constipation orders for residents with renal failure CFR less than 30. Contact MD for orders)     oxyCODONE 30 MG 12 hr tablet Take 1 tablet (30 mg total) by mouth every 12 (twelve) hours. 28 tablet 0   Oxycodone HCl 10 MG TABS Take 1 tablet (10 mg total) by mouth every 6 (six) hours as needed. 30 tablet 0   polyethylene glycol (MIRALAX / GLYCOLAX) 17 g packet Take 17 g by mouth 2 (two) times daily. 14 each 0   pregabalin (LYRICA) 150 MG capsule Take 1 capsule (150 mg total) by mouth 3 (three) times daily. 90 capsule 0   QUEtiapine (SEROQUEL) 50 MG tablet Take 3 tablets (150 mg total) by mouth at bedtime. 90 tablet 3   senna-docusate (  SENOKOT-S) 8.6-50 MG tablet Take 1 tablet by mouth at bedtime.     Sodium Phosphates (RA SALINE ENEMA RE) If not relieved by Biscodyl suppository, give disposable Saline Enema rectally X 1 dose/24 hrs as needed     No current facility-administered medications for this visit.     ROS:  See HPI  Physical Exam:  Vitals:   05/11/21 1516  BP: 116/73  Pulse: 86  Resp: 18  Temp: 98.1 F (36.7 C)  TempSrc: Oral  SpO2: 95%  Weight: 217 lb (98.4 kg)  Height: 5\' 11"  (1.803 m)    General: well appearing, not in any acute discomfort Lungs: non labored Cardiac: regular Incision:  Left AKA stump well appearing. Flaps viable. Staples removed  Extremities:  Right AKA well healed Neuro: alert and oriented   Assessment/Plan:  This is a 50  y.o. male who is s/p: left above knee amputation on 04/15/21 by Dr. Stanford Breed. Left AKA looks great. Healing nicely. Staples removed today. He will see pain management going forward for chronic post procedural pain.  - Would recommend not initiating prosthesis training for 4-6 more weeks - He can follow up with [place] as needed if he has new or concerning symptoms   Karoline Caldwell, PA-C [facility] [phone]  Clinic MD:  Roxanne Mins

## 2021-05-13 ENCOUNTER — Other Ambulatory Visit: Payer: Self-pay | Admitting: Adult Health

## 2021-05-13 MED ORDER — PREGABALIN 150 MG PO CAPS: 150.0000 mg | ORAL_CAPSULE | Freq: Three times a day (TID) | ORAL | 0 refills | Status: DC

## 2021-05-18 ENCOUNTER — Telehealth: Payer: Self-pay

## 2021-05-18 NOTE — Telephone Encounter (Signed)
[facility] calls from the SNF today to report that patient has small open area in the lateral aspect of his left BKA, it is pink and shows no s/s of infection. Also some edema around the area. Advised to have patient elevate stump as much as possible - instructed to call back if wound worsened or patient developed s/s of infection.

## 2021-05-19 ENCOUNTER — Other Ambulatory Visit: Payer: Self-pay | Admitting: Adult Health

## 2021-05-19 DIAGNOSIS — G8928 Other chronic postprocedural pain: Secondary | ICD-10-CM

## 2021-05-19 MED ORDER — OXYCODONE HCL ER 30 MG PO T12A: 30.0000 mg | EXTENDED_RELEASE_TABLET | Freq: Two times a day (BID) | ORAL | 0 refills | Status: DC

## 2021-05-19 MED ORDER — OXYCODONE HCL 10 MG PO TABS: 10.0000 mg | ORAL_TABLET | Freq: Four times a day (QID) | ORAL | 0 refills | Status: DC | PRN

## 2021-05-20 ENCOUNTER — Non-Acute Institutional Stay (SKILLED_NURSING_FACILITY): Payer: Medicare Other | Admitting: Adult Health

## 2021-05-20 ENCOUNTER — Telehealth: Payer: Self-pay

## 2021-05-20 ENCOUNTER — Encounter: Payer: Self-pay | Admitting: Adult Health

## 2021-05-20 DIAGNOSIS — T8130XA Disruption of wound, unspecified, initial encounter: Secondary | ICD-10-CM

## 2021-05-20 DIAGNOSIS — D62 Acute posthemorrhagic anemia: Secondary | ICD-10-CM | POA: Diagnosis not present

## 2021-05-20 DIAGNOSIS — I82591 Chronic embolism and thrombosis of other specified deep vein of right lower extremity: Secondary | ICD-10-CM | POA: Diagnosis not present

## 2021-05-20 NOTE — Progress Notes (Signed)
Location:  [place] Room Number: 916-B Place of Service:  SNF (31) Provider:  Durenda Age, DNP, FNP-BC  Patient Care Team: [name], MD as PCP - General (Internal Medicine) Drema Pry as Social Worker [place], Cleophus Molt as Social Worker  Extended Emergency Contact Information Primary Emergency Contact: Bannan,BERNARD Address: [place]          Kathyrn Lass Mobile Phone: [phone] Relation: Brother Secondary Emergency Contact: Kaing,Kim Mobile Phone: [phone] Relation: Other Preferred language: English Interpreter needed? No  Code Status: Full Code   Goals of care: Advanced Directive information Advanced Directives 05/20/2021  Does Patient Have a Medical Advance Directive? No  Would patient like information on creating a medical advance directive? No - Patient declined     Chief Complaint  Patient presents with   Acute Visit    Short-term rehabilitation    HPI:  Pt is a 50 y.o. male seen today for short-term rehabilitation visit. He is currently having PT and OT. He was noted to have 2 wounds dehiscence on his left AKA stump.The area is dry and no erythema.  He thought that it was due to being in pain in his stump on the edge of the bed when transferring from bed to wheelchair. He had a recent AKA on 11/17.  Latest hgb 10.0, 04/27/21.  He takes Eliquis 5 mg twice a day for history of DVT.   Past Medical History:  Diagnosis Date   Anxiety    Atypical chest pain 01/13/2021   Clotting disorder ([place])    Depression    H/O blood clots    Hypertension    Left leg weakness 11/24/2020   Past Surgical History:  Procedure Laterality Date   ABDOMINAL AORTOGRAM W/LOWER EXTREMITY N/A 01/18/2021   Procedure: ABDOMINAL AORTOGRAM W/LOWER EXTREMITY;  Surgeon: Waynetta Sandy, MD;  Location: [place] [facility];  Service: Cardiovascular;  Laterality: N/A;   AMPUTATION Left 04/15/2021   Procedure: LEFT ABOVE KNEE  AMPUTATION;  Surgeon: Cherre Robins, MD;  Location: [facility];  Service: Vascular;  Laterality: Left;   BELOW KNEE LEG AMPUTATION Right 2018   FEMORAL-TIBIAL BYPASS GRAFT Left 01/20/2021   Procedure: BYPASS GRAFT FEMORAL-PT ARTERY LEFT USING 6 MM X 80 CM GORE PROPATEN VASCULAR GRAFT REMOVABLE RING;  Surgeon: Waynetta Sandy, MD;  Location: [facility];  Service: Vascular;  Laterality: Left;   WOUND DEBRIDEMENT Left 02/09/2021   Procedure: LEFT LEG WASHOUT AND WOUND CLOSURE;  Surgeon: Waynetta Sandy, MD;  Location: [facility];  Service: Vascular;  Laterality: Left;    Allergies  Allergen Reactions   Contrast Media [Iodinated Contrast Media] Nausea Only    NOT AN ALLERGY. ONLY NAUSEA. 12/11/20  Newly reported by [facility] on 12/04/20.    Outpatient Encounter Medications as of 05/20/2021  Medication Sig   acetaminophen (TYLENOL) 325 MG tablet Take 2 tablets (650 mg total) by mouth every 6 (six) hours as needed for moderate pain or mild pain.   apixaban (ELIQUIS) 5 MG TABS tablet Take 1 tablet (5 mg total) by mouth 2 (two) times daily.   aspirin EC 81 MG tablet Take 81 mg by mouth daily. Swallow whole.   atorvastatin (LIPITOR) 40 MG tablet Take 1 tablet (40 mg total) by mouth at bedtime.   bisacodyl (DULCOLAX) 10 MG suppository If not relieved by MOM, give 10 mg Bisacodyl suppositiory rectally X 1 dose in 24 hours as needed   DULoxetine (CYMBALTA) 60 MG capsule  Take 1 capsule (60 mg total) by mouth daily.   famotidine (PEPCID) 20 MG tablet Take 1 tablet (20 mg total) by mouth daily.   Magnesium Hydroxide (MILK OF MAGNESIA PO) If no BM in 3 days, give 30 cc Milk of Magnesium p.o. x 1 dose in 24 hours as needed (Do not use standing constipation orders for residents with renal failure CFR less than 30. Contact MD for orders)   methocarbamol (ROBAXIN) 500 MG tablet Take 500 mg by mouth every 8 (eight) hours as needed for muscle spasms.   Nutritional Supplements (ENSURE PO) Take by  mouth. TAKE 237 ML BY MOUTH TWICE DAILY FOR SUPPLEMENT   oxyCODONE 30 MG 12 hr tablet Take 1 tablet (30 mg total) by mouth every 12 (twelve) hours.   Oxycodone HCl 10 MG TABS Take 1 tablet (10 mg total) by mouth every 6 (six) hours as needed.   polyethylene glycol (MIRALAX / GLYCOLAX) 17 g packet Take 17 g by mouth 2 (two) times daily.   QUETIAPINE FUMARATE PO Take 200 mg by mouth at bedtime. For depression   senna-docusate (SENOKOT-S) 8.6-50 MG tablet Take 1 tablet by mouth at bedtime.   Sodium Phosphates (RA SALINE ENEMA RE) If not relieved by Biscodyl suppository, give disposable Saline Enema rectally X 1 dose/24 hrs as needed   [DISCONTINUED] pregabalin (LYRICA) 150 MG capsule Take 1 capsule (150 mg total) by mouth 3 (three) times daily.   QUEtiapine (SEROQUEL) 50 MG tablet Take 3 tablets (150 mg total) by mouth at bedtime.   [DISCONTINUED] guaiFENesin (CHEST CONGESTION RELIEF PO) TAKE 10 ML BY MOUTH TWICE A DAY FOR 14 DAYS FOR SORE THROAT/COUGH   No facility-administered encounter medications on file as of 05/20/2021.    Review of Systems  GENERAL: No change in appetite, no fatigue, no weight changes, no fever, chills or weakness MOUTH and THROAT: Denies oral discomfort, gingival pain or bleeding, pain from teeth or hoarseness   RESPIRATORY: no cough, SOB, DOE, wheezing, hemoptysis CARDIAC: No chest pain, edema or palpitations GI: No abdominal pain, diarrhea, constipation, heart burn, nausea or vomiting GU: Denies dysuria, frequency, hematuria, incontinence, or discharge NEUROLOGICAL: Denies dizziness, syncope, numbness, or headache PSYCHIATRIC: Denies feelings of depression or anxiety. No report of hallucinations, insomnia, paranoia, or agitation    Immunization History  Administered Date(s) Administered   Influenza,inj,Quad PF,6+ Mos 01/28/2021   Pertinent  Health Maintenance Due  Topic Date Due   COLONOSCOPY (Pts 45-94yrs Insurance coverage will need to be confirmed)  Never  done   INFLUENZA VACCINE  Completed   Fall Risk 04/26/2021 04/26/2021 04/27/2021 04/28/2021 04/28/2021  Falls in the past year? - - - - -  Was there an injury with Fall? - - - - -  Fall Risk Category Calculator - - - - -  Fall Risk Category - - - - -  Patient Fall Risk Level Moderate fall risk Moderate fall risk Moderate fall risk Moderate fall risk Moderate fall risk  Patient at Risk for Falls Due to - - - - -  Fall risk Follow up - - - - -     Vitals:   05/20/21 1338  BP: 116/65  Pulse: 71  Resp: 16  Temp: (!) 97.1 F (36.2 C)  Weight: 206 lb 6.4 oz (93.6 kg)  Height: 5\' 11"  (1.803 m)   Body mass index is 28.79 kg/m.  Physical Exam  GENERAL APPEARANCE: Well nourished. In no acute distress. Normal body habitus SKIN:  see HPI MOUTH and  THROAT: Lips are without lesions. Oral mucosa is moist and without lesions.  RESPIRATORY: Breathing is even & unlabored, BS CTAB CARDIAC: RRR, no murmur,no extra heart sounds, no edema GI: Abdomen soft, normal BS, no masses, no tenderness EXTREMITIES:  old right AKA, new Left AKA with dressing NEUROLOGICAL: There is no tremor. Speech is clear. Alert and oriented X 3. PSYCHIATRIC:  Affect and behavior are appropriate  Labs reviewed: Recent Labs    11/24/20 2125 12/03/20 1202 04/21/21 0119 04/24/21 0050 04/27/21 0042  NA  --    < > 138 136 136  K  --    < > 3.5 3.7 4.0  CL  --    < > 106 106 105  CO2  --    < > 26 23 24   GLUCOSE  --    < > 147* 127* 100*  BUN  --    < > 9 10 10   CREATININE  --    < > 1.08 1.18 1.26*  CALCIUM  --    < > 8.5* 8.4* 8.8*  MG 2.2  --   --   --   --   PHOS 4.7*  --   --   --   --    < > = values in this interval not displayed.   Recent Labs    02/18/21 2121 02/21/21 1708 04/14/21 2124  AST 20 18 20   ALT 25 21 24   ALKPHOS 98 97 67  BILITOT 0.6 0.6 0.9  PROT 6.4* 7.3 6.7  ALBUMIN 3.5 3.9 3.7   Recent Labs    02/10/21 0817 02/18/21 2121 02/21/21 1708 04/14/21 1314 04/14/21 2124  04/22/21 0056 04/24/21 0050 04/27/21 0042  WBC 13.8* 7.8   < > 9.1   < > 7.5 6.8 7.7  NEUTROABS 11.4* 4.3  --  6.5  --   --   --   --   HGB 11.2* 11.2*   < > 14.5   < > 9.6* 9.3* 10.0*  HCT 34.6* 35.4*   < > 46.7   < > 29.7* 28.8* 32.0*  MCV 84.2 84.5   < > 82.4   < > 82.3 82.5 83.6  PLT 312 236   < > 340   < > 330 329 358   < > = values in this interval not displayed.   Lab Results  Component Value Date   TSH 2.337 11/24/2020   Lab Results  Component Value Date   HGBA1C 5.6 04/15/2021   Lab Results  Component Value Date   CHOL 266 (H) 04/15/2021   HDL 31 (L) 04/15/2021   LDLCALC 159 (H) 04/15/2021   TRIG 378 (H) 04/15/2021   CHOLHDL 8.6 04/15/2021    Significant Diagnostic Results in last 30 days:  No results found.  Assessment/Plan  1. Wound dehiscence -   Monitor area for drainage and erythema -   Cover with foam dressing -   follow up with vein and vascularS urgery  2. Acute blood loss anemia Lab Results  Component Value Date   HGB 10.0 (L) 04/27/2021    -  will monitor  3. 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  Labs/tests ordered:    CBC and BMP with GFR  Goals of care:   Short-term care   Durenda Age, DNP, MSN,  FNP-BC [facility] and [facility] [phone] (Monday-Friday 8:00 a.m. - 5:00 p.m.) [phone] (after hours)

## 2021-05-20 NOTE — Telephone Encounter (Signed)
Received another call from [facility] concerning patient's stump wound. Apparently it is separating more. Still no s/s of infection but NP would like an appointment with [facility]. Scheduled next week on TH day.

## 2021-05-25 ENCOUNTER — Ambulatory Visit (INDEPENDENT_AMBULATORY_CARE_PROVIDER_SITE_OTHER): Payer: Medicare Other | Admitting: Physician Assistant

## 2021-05-25 ENCOUNTER — Other Ambulatory Visit: Payer: Self-pay

## 2021-05-25 VITALS — BP 140/82 | HR 83 | Temp 98.4°F

## 2021-05-25 DIAGNOSIS — Z89611 Acquired absence of right leg above knee: Secondary | ICD-10-CM

## 2021-05-25 DIAGNOSIS — Z89612 Acquired absence of left leg above knee: Secondary | ICD-10-CM

## 2021-05-25 NOTE — Progress Notes (Signed)
° ° °  Postoperative Visit    History of Present Illness   Jose Hansen is a 50 y.o. male who presents for postoperative follow-up for: left above-the-knee ampuation.(Date: 04/15/21).  Patient underwent left femoral to popliteal bypass with PTFE that eventually thrombosed.  He currently resides at [facility] facility.  He states he has a small remaining opening mid incision without drainage.  Prior right AKA is well-healed however he is not spoken with anyone about a prosthetic.  Patient is interested in eventually walking with 2 prosthetic legs.   For VQI Use Only   PRE-ADM LIVING: Home  AMB STATUS: Ambulatory   Physical Examination   Vitals:   05/25/21 0847  BP: 140/82  Pulse: 83  Temp: 98.4 F (36.9 C)  SpO2: 96%    BLE: Right AKA well-healed; left AKA healing well with small mid incision opening without drainage or sign of infection; left stump is somewhat edematous   Medical Decision Making   Jose Hansen is a 50 y.o. male who presents s/p left above-the-knee amputation.  Left AKA with small opening mid incision remaining; no sign of infection currently.  Encourage patient to wash wound with soap and water daily and continue dressing changes I believe he is a good candidate for right leg prosthetic as well as left AKA limb shrinker; he will not be recommended for fitting of left leg prosthetic until incisions completely healed  The patient has a bilateral Above Knee Amputation. The patient is well motivated to return to their prior functional status by utilizing a prosthesis to perform ADL's and maintain a healthy lifestyle. The patient has the physical and cognitive capacity to function with a prosthesis.   Functional Level: K1 Household Ambulator: Has the ability or potential to use prosthesis for transfers/ambulation on level surfaces at a fixed cadence  Residual Limb History: The skin condition of the residual right limb is above average.  The patient will continue to monitor the skin of the residual limb and follow hygiene instructions.  The patient is experiencing no pain related to amputation L AKA still has a portion of mid incision still healing.  He should be ok for limb shrinker at this time with plans for prosthetic leg once completely healed  Prosthetic Prescription Plan: Counseling and education regarding prosthetic management will be provided to the patient via a certified prosthetist. A multi-discipline team, including physical therapy, will manage the prosthetic fabrication, fitting and prosthetic gait training.    Dagoberto Ligas PA-C [facility] Office: [phone]  Clinic MD: Stanford Breed

## 2021-05-27 ENCOUNTER — Other Ambulatory Visit: Payer: Self-pay | Admitting: Adult Health

## 2021-05-27 MED ORDER — PREGABALIN 150 MG PO CAPS: 150.0000 mg | ORAL_CAPSULE | Freq: Three times a day (TID) | ORAL | 0 refills | Status: DC

## 2021-05-28 ENCOUNTER — Other Ambulatory Visit (HOSPITAL_COMMUNITY): Payer: Self-pay

## 2021-05-31 LAB — BASIC METABOLIC PANEL
BUN: 11 (ref 4–21)
CO2: 25 — AB (ref 13–22)
Chloride: 99 (ref 99–108)
Creatinine: 1 (ref 0.6–1.3)
Glucose: 156
Potassium: 4.4 (ref 3.4–5.3)
Sodium: 140 (ref 137–147)

## 2021-05-31 LAB — CBC AND DIFFERENTIAL
HCT: 37 — AB (ref 41–53)
Hemoglobin: 11.5 — AB (ref 13.5–17.5)
Platelets: 199 (ref 150–399)
WBC: 6

## 2021-05-31 LAB — COMPREHENSIVE METABOLIC PANEL: Calcium: 9.1 (ref 8.7–10.7)

## 2021-05-31 LAB — CBC: RBC: 4.55 (ref 3.87–5.11)

## 2021-06-04 ENCOUNTER — Other Ambulatory Visit (HOSPITAL_COMMUNITY): Payer: Self-pay

## 2021-06-07 ENCOUNTER — Other Ambulatory Visit: Payer: Self-pay | Admitting: Adult Health

## 2021-06-07 DIAGNOSIS — G8928 Other chronic postprocedural pain: Secondary | ICD-10-CM

## 2021-06-07 MED ORDER — OXYCODONE HCL ER 30 MG PO T12A: 30.0000 mg | EXTENDED_RELEASE_TABLET | Freq: Two times a day (BID) | ORAL | 0 refills | Status: DC

## 2021-06-07 MED ORDER — OXYCODONE HCL 10 MG PO TABS: 10.0000 mg | ORAL_TABLET | Freq: Four times a day (QID) | ORAL | 0 refills | Status: DC | PRN

## 2021-06-09 ENCOUNTER — Encounter: Payer: Self-pay | Admitting: Adult Health

## 2021-06-09 ENCOUNTER — Non-Acute Institutional Stay (SKILLED_NURSING_FACILITY): Payer: Medicare Other | Admitting: Adult Health

## 2021-06-09 DIAGNOSIS — I10 Essential (primary) hypertension: Secondary | ICD-10-CM

## 2021-06-09 DIAGNOSIS — K219 Gastro-esophageal reflux disease without esophagitis: Secondary | ICD-10-CM

## 2021-06-09 DIAGNOSIS — R079 Chest pain, unspecified: Secondary | ICD-10-CM

## 2021-06-09 DIAGNOSIS — E118 Type 2 diabetes mellitus with unspecified complications: Secondary | ICD-10-CM | POA: Diagnosis not present

## 2021-06-09 LAB — BASIC METABOLIC PANEL
BUN: 21 (ref 4–21)
CO2: 22 (ref 13–22)
Chloride: 94 — AB (ref 99–108)
Creatinine: 0.9 (ref 0.6–1.3)
Potassium: 4.7 (ref 3.4–5.3)
Sodium: 133 — AB (ref 137–147)

## 2021-06-09 LAB — CBC: RBC: 4.84 (ref 3.87–5.11)

## 2021-06-09 LAB — CBC AND DIFFERENTIAL
HCT: 39 — AB (ref 41–53)
Hemoglobin: 12.2 — AB (ref 13.5–17.5)
Neutrophils Absolute: 5.2
Platelets: 280 (ref 150–399)
WBC: 7.7

## 2021-06-09 LAB — COMPREHENSIVE METABOLIC PANEL
Calcium: 10 (ref 8.7–10.7)
GFR calc Af Amer: >90
GFR calc non Af Amer: >90

## 2021-06-09 NOTE — Progress Notes (Signed)
Location:  [place] Room Number: 676-H Place of Service:  SNF (31) Provider:  Durenda Age, DNP, FNP-BC  Patient Care Team: [name], MD as PCP - General (Internal Medicine) Drema Pry as Social Worker [place], Cleophus Molt as Social Worker  Extended Emergency Contact Information Primary Emergency Contact: Zirbel,BERNARD Address: [place]          Kathyrn Lass Mobile Phone: [phone] Relation: Brother Secondary Emergency Contact: Kaing,Kim Mobile Phone: [phone] Relation: Other Preferred language: English Interpreter needed? No  Code Status:  Full Code   Goals of care: Advanced Directive information Advanced Directives 06/09/2021  Does Patient Have a Medical Advance Directive? No  Would patient like information on creating a medical advance directive? No - Patient declined     Chief Complaint  Patient presents with   Acute Visit    Chest Pain     HPI:  Pt is a 51 y.o. male seen today for an acute visit. He is a short-term care resident of [facility].  He has a PMH of hypertension, hyperlipidemia and severe PVD/PAD S/P right AKA.  He had a  Left AKA on 11/17 and is currently having PT and OT SNF.  He complained chest pain last night.  Night shift charge nurse reported he had eaten a [employer] hamburger last night. BP 140/90, HR 88.  He is not taking any antihypertensive medication.  He complains of dizziness.  CBG 543. He is not taking anything for diabetes. Last A1c done on 04/15/22 was 5.6. EKG showed sinus rhythm and troponin <0.3.   Past Medical History:  Diagnosis Date   Anxiety    Atypical chest pain 01/13/2021   Clotting disorder ([place])    Depression    H/O blood clots    Hypertension    Left leg weakness 11/24/2020   Past Surgical History:  Procedure Laterality Date   ABDOMINAL AORTOGRAM W/LOWER EXTREMITY N/A 01/18/2021   Procedure: ABDOMINAL AORTOGRAM W/LOWER EXTREMITY;  Surgeon:  Waynetta Sandy, MD;  Location: [place] [facility];  Service: Cardiovascular;  Laterality: N/A;   AMPUTATION Left 04/15/2021   Procedure: LEFT ABOVE KNEE AMPUTATION;  Surgeon: Cherre Robins, MD;  Location: [place];  Service: Vascular;  Laterality: Left;   BELOW KNEE LEG AMPUTATION Right 2018   FEMORAL-TIBIAL BYPASS GRAFT Left 01/20/2021   Procedure: BYPASS GRAFT FEMORAL-PT ARTERY LEFT USING 6 MM X 80 CM GORE PROPATEN VASCULAR GRAFT REMOVABLE RING;  Surgeon: Waynetta Sandy, MD;  Location: [place];  Service: Vascular;  Laterality: Left;   WOUND DEBRIDEMENT Left 02/09/2021   Procedure: LEFT LEG WASHOUT AND WOUND CLOSURE;  Surgeon: Waynetta Sandy, MD;  Location: [place];  Service: Vascular;  Laterality: Left;    Allergies  Allergen Reactions   Contrast Media [Iodinated Contrast Media] Nausea Only    NOT AN ALLERGY. ONLY NAUSEA. 12/11/20  Newly reported by [facility] on 12/04/20.    Outpatient Encounter Medications as of 06/09/2021  Medication Sig   acetaminophen (TYLENOL) 325 MG tablet Take 2 tablets (650 mg total) by mouth every 6 (six) hours as needed for moderate pain or mild pain.   apixaban (ELIQUIS) 5 MG TABS tablet Take 1 tablet (5 mg total) by mouth 2 (two) times daily.   aspirin EC 81 MG tablet Take 81 mg by mouth daily. Swallow whole.   atorvastatin (LIPITOR) 40 MG tablet Take 1 tablet (40 mg total) by mouth at bedtime.   bisacodyl (DULCOLAX)  10 MG suppository If not relieved by MOM, give 10 mg Bisacodyl suppositiory rectally X 1 dose in 24 hours as needed   DULoxetine (CYMBALTA) 60 MG capsule Take 1 capsule (60 mg total) by mouth daily.   famotidine (PEPCID) 20 MG tablet Take 1 tablet (20 mg total) by mouth daily.   Magnesium Hydroxide (MILK OF MAGNESIA PO) If no BM in 3 days, give 30 cc Milk of Magnesium p.o. x 1 dose in 24 hours as needed (Do not use standing constipation orders for residents with renal failure CFR less than 30. Contact MD for  orders)   methocarbamol (ROBAXIN) 500 MG tablet Take 500 mg by mouth every 8 (eight) hours as needed for muscle spasms.   Nutritional Supplements (ENSURE PO) Take by mouth. TAKE 237 ML BY MOUTH TWICE DAILY FOR SUPPLEMENT   omeprazole (PRILOSEC) 40 MG capsule Take 40 mg by mouth daily.   oxyCODONE (OXYCONTIN) 20 mg 12 hr tablet Take 30 mg by mouth 2 (two) times daily as needed.   Oxycodone HCl 10 MG TABS Take 1 tablet (10 mg total) by mouth every 6 (six) hours as needed.   polyethylene glycol (MIRALAX / GLYCOLAX) 17 g packet Take 17 g by mouth daily as needed.   pregabalin (LYRICA) 150 MG capsule Take 1 capsule (150 mg total) by mouth 3 (three) times daily.   QUETIAPINE FUMARATE PO Take 200 mg by mouth at bedtime. For depression   senna-docusate (SENOKOT-S) 8.6-50 MG tablet Take 1 tablet by mouth at bedtime.   Sodium Phosphates (RA SALINE ENEMA RE) If not relieved by Biscodyl suppository, give disposable Saline Enema rectally X 1 dose/24 hrs as needed   [DISCONTINUED] oxyCODONE 30 MG 12 hr tablet Take 1 tablet (30 mg total) by mouth every 12 (twelve) hours.   [DISCONTINUED] polyethylene glycol (MIRALAX / GLYCOLAX) 17 g packet Take 17 g by mouth 2 (two) times daily. (Patient not taking: Reported on 06/09/2021)   [DISCONTINUED] QUEtiapine (SEROQUEL) 50 MG tablet Take 3 tablets (150 mg total) by mouth at bedtime.   No facility-administered encounter medications on file as of 06/09/2021.    Review of Systems  Constitutional:  Negative for activity change, appetite change and fever.  HENT:  Negative for sore throat.   Eyes: Negative.   Respiratory:  Negative for shortness of breath.   Cardiovascular:  Positive for chest pain. Negative for leg swelling.  Gastrointestinal:  Negative for abdominal distention, diarrhea and vomiting.  Genitourinary:  Negative for dysuria, frequency and urgency.  Skin:  Negative for color change.  Neurological:  Negative for dizziness and headaches.   Psychiatric/Behavioral:  Negative for behavioral problems and sleep disturbance. The patient is not nervous/anxious.       Immunization History  Administered Date(s) Administered   Influenza,inj,Quad PF,6+ Mos 01/28/2021   Pertinent  Health Maintenance Due  Topic Date Due   COLONOSCOPY (Pts 45-27yrs Insurance coverage will need to be confirmed)  Never done   INFLUENZA VACCINE  Completed   Fall Risk 04/26/2021 04/26/2021 04/27/2021 04/28/2021 04/28/2021  Falls in the past year? - - - - -  Was there an injury with Fall? - - - - -  Fall Risk Category Calculator - - - - -  Fall Risk Category - - - - -  Patient Fall Risk Level Moderate fall risk Moderate fall risk Moderate fall risk Moderate fall risk Moderate fall risk  Patient at Risk for Falls Due to - - - - -  Fall risk Follow up - - - - -  Vitals:   06/09/21 1507  BP: 138/82  Pulse: 82  Resp: 18  Temp: (!) 97.3 F (36.3 C)  Weight: 211 lb 6.4 oz (95.9 kg)  Height: 5\' 11"  (1.803 m)   Body mass index is 29.48 kg/m.  Physical Exam Constitutional:      Appearance: He is obese.  HENT:     Head: Normocephalic and atraumatic.     Mouth/Throat:     Mouth: Mucous membranes are moist.  Eyes:     Conjunctiva/sclera: Conjunctivae normal.  Cardiovascular:     Rate and Rhythm: Normal rate and regular rhythm.  Pulmonary:     Effort: Pulmonary effort is normal.     Breath sounds: Normal breath sounds.  Abdominal:     General: Bowel sounds are normal.     Palpations: Abdomen is soft.  Musculoskeletal:     Comments: Bilateral AKA.  Skin:    General: Skin is warm and dry.  Neurological:     General: No focal deficit present.     Mental Status: He is alert and oriented to person, place, and time. Mental status is at baseline.  Psychiatric:        Mood and Affect: Mood normal.        Behavior: Behavior normal.        Thought Content: Thought content normal.        Judgment: Judgment normal.       Labs  reviewed: Recent Labs    11/24/20 2125 12/03/20 1202 04/21/21 0119 04/24/21 0050 04/27/21 0042 05/31/21 0000  NA  --    < > 138 136 136 140  K  --    < > 3.5 3.7 4.0 4.4  CL  --    < > 106 106 105 99  CO2  --    < > 26 23 24  25*  GLUCOSE  --    < > 147* 127* 100*  --   BUN  --    < > 9 10 10 11   CREATININE  --    < > 1.08 1.18 1.26* 1.0  CALCIUM  --    < > 8.5* 8.4* 8.8* 9.1  MG 2.2  --   --   --   --   --   PHOS 4.7*  --   --   --   --   --    < > = values in this interval not displayed.   Recent Labs    02/18/21 2121 02/21/21 1708 04/14/21 2124  AST 20 18 20   ALT 25 21 24   ALKPHOS 98 97 67  BILITOT 0.6 0.6 0.9  PROT 6.4* 7.3 6.7  ALBUMIN 3.5 3.9 3.7   Recent Labs    02/10/21 0817 02/18/21 2121 02/21/21 1708 04/14/21 1314 04/14/21 2124 04/22/21 0056 04/24/21 0050 04/27/21 0042 05/31/21 0000  WBC 13.8* 7.8   < > 9.1   < > 7.5 6.8 7.7 6.0  NEUTROABS 11.4* 4.3  --  6.5  --   --   --   --   --   HGB 11.2* 11.2*   < > 14.5   < > 9.6* 9.3* 10.0* 11.5*  HCT 34.6* 35.4*   < > 46.7   < > 29.7* 28.8* 32.0* 37*  MCV 84.2 84.5   < > 82.4   < > 82.3 82.5 83.6  --   PLT 312 236   < > 340   < > 330 329 358 199   < > =  values in this interval not displayed.   Lab Results  Component Value Date   TSH 2.337 11/24/2020   Lab Results  Component Value Date   HGBA1C 5.6 04/15/2021   Lab Results  Component Value Date   CHOL 266 (H) 04/15/2021   HDL 31 (L) 04/15/2021   LDLCALC 159 (H) 04/15/2021   TRIG 378 (H) 04/15/2021   CHOLHDL 8.6 04/15/2021    Significant Diagnostic Results in last 30 days:  No results found.  Assessment/Plan  1. 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 Gastroesophageal reflux disease without esophagitis -    Continue omeprazole 40 mg 1 capsule daily and famotidine 20 mg 1 tab daily  4.   Essential hypertension -Start on amlodipine 5 mg 1 tab daily -   Monitor BPs    Family/ staff Communication: Discussed plan of care with resident and charge nurse  Labs/tests ordered: Hepatitis C antibody for screening   Durenda Age, DNP, MSN, FNP-BC [facility] and Adult Medicine [phone] (Monday-Friday 8:00 a.m. - 5:00 p.m.) [phone] (after hours)

## 2021-06-10 LAB — HEMOGLOBIN A1C: Hemoglobin A1C: 10.6

## 2021-06-14 LAB — HM HEPATITIS C SCREENING LAB: HM Hepatitis Screen: NEGATIVE

## 2021-06-17 ENCOUNTER — Encounter: Payer: Self-pay | Admitting: Adult Health

## 2021-06-17 ENCOUNTER — Non-Acute Institutional Stay (SKILLED_NURSING_FACILITY): Payer: Medicare Other | Admitting: Adult Health

## 2021-06-17 DIAGNOSIS — Z89612 Acquired absence of left leg above knee: Secondary | ICD-10-CM | POA: Diagnosis not present

## 2021-06-17 DIAGNOSIS — E118 Type 2 diabetes mellitus with unspecified complications: Secondary | ICD-10-CM | POA: Diagnosis not present

## 2021-06-17 DIAGNOSIS — R112 Nausea with vomiting, unspecified: Secondary | ICD-10-CM

## 2021-06-17 LAB — HEPATIC FUNCTION PANEL
ALT: 38 (ref 10–40)
AST: 34 (ref 14–40)
Alkaline Phosphatase: 98 (ref 25–125)
Bilirubin, Total: 0.2

## 2021-06-17 LAB — CBC: RBC: 4.75 (ref 3.87–5.11)

## 2021-06-17 LAB — COMPREHENSIVE METABOLIC PANEL
Albumin: 4.5 (ref 3.5–5.0)
Calcium: 9.6 (ref 8.7–10.7)
GFR calc Af Amer: >90
GFR calc non Af Amer: >90
Globulin: 2.4

## 2021-06-17 LAB — BASIC METABOLIC PANEL
BUN: 14 (ref 4–21)
CO2: 22 (ref 13–22)
Chloride: 102 (ref 99–108)
Creatinine: 0.9 (ref 0.6–1.3)
Glucose: 220
Potassium: 4 (ref 3.4–5.3)
Sodium: 138 (ref 137–147)

## 2021-06-17 LAB — CBC AND DIFFERENTIAL
HCT: 38 — AB (ref 41–53)
Hemoglobin: 11.9 — AB (ref 13.5–17.5)
Neutrophils Absolute: 4.9
Platelets: 259 (ref 150–399)
WBC: 7.5

## 2021-06-17 NOTE — Progress Notes (Signed)
Location:  Financial controller of Service:  SNF (31) Provider:  Durenda Age, DNP, FNP-BC  Patient Care Team: [name], MD as PCP - General (Internal Medicine) Drema Pry as Social Worker [place], Cleophus Molt as Social Worker  Extended Emergency Contact Information Primary Emergency Contact: Barocio,BERNARD Address: [address] [place]          Kathyrn Lass Mobile Phone: [phone] Relation: Brother Secondary Emergency Contact: Kaing,Kim Mobile Phone: [phone] Relation: Other Preferred language: English Interpreter needed? No  Code Status:   Full Code  Goals of care: Advanced Directive information Advanced Directives 06/09/2021  Does Patient Have a Medical Advance Directive? No  Would patient like information on creating a medical advance directive? No - Patient declined     Chief Complaint  Patient presents with   Acute Visit    Nausea and vomiting    HPI:  Pt is a 51 y.o. male seen today for nausea and vomiting. He was reported to have vomited X 1. CBGs ranging from  148 to 427. He takes Lantus 10 units SQ at bedtime, Metformin 1,000 mg BID and Novolog 5 units SQ TIDac for CBG >= 300 for diabetes mellitus. He is currently having PT and OT post hospitalization 04/14/21 to 04/28/21 for left AKA on 04/15/22.   Past Medical History:  Diagnosis Date   Anxiety    Atypical chest pain 01/13/2021   Clotting disorder ([place])    Depression    H/O blood clots    Hypertension    Left leg weakness 11/24/2020   Past Surgical History:  Procedure Laterality Date   ABDOMINAL AORTOGRAM W/LOWER EXTREMITY N/A 01/18/2021   Procedure: ABDOMINAL AORTOGRAM W/LOWER EXTREMITY;  Surgeon: Waynetta Sandy, MD;  Location: [facility];  Service: Cardiovascular;  Laterality: N/A;   AMPUTATION Left 04/15/2021   Procedure: LEFT ABOVE KNEE AMPUTATION;  Surgeon: Cherre Robins, MD;  Location: [facility];  Service: Vascular;  Laterality: Left;   BELOW KNEE  LEG AMPUTATION Right 2018   FEMORAL-TIBIAL BYPASS GRAFT Left 01/20/2021   Procedure: BYPASS GRAFT FEMORAL-PT ARTERY LEFT USING 6 MM X 80 CM GORE PROPATEN VASCULAR GRAFT REMOVABLE RING;  Surgeon: Waynetta Sandy, MD;  Location: [facility];  Service: Vascular;  Laterality: Left;   WOUND DEBRIDEMENT Left 02/09/2021   Procedure: LEFT LEG WASHOUT AND WOUND CLOSURE;  Surgeon: Waynetta Sandy, MD;  Location: [facility];  Service: Vascular;  Laterality: Left;    Allergies  Allergen Reactions   Contrast Media [Iodinated Contrast Media] Nausea Only    NOT AN ALLERGY. ONLY NAUSEA. 12/11/20  Newly reported by [facility] on 12/04/20.    Outpatient Encounter Medications as of 06/17/2021  Medication Sig   acetaminophen (TYLENOL) 325 MG tablet Take 2 tablets (650 mg total) by mouth every 6 (six) hours as needed for moderate pain or mild pain.   apixaban (ELIQUIS) 5 MG TABS tablet Take 1 tablet (5 mg total) by mouth 2 (two) times daily.   aspirin EC 81 MG tablet Take 81 mg by mouth daily. Swallow whole.   atorvastatin (LIPITOR) 40 MG tablet Take 1 tablet (40 mg total) by mouth at bedtime.   bisacodyl (DULCOLAX) 10 MG suppository If not relieved by MOM, give 10 mg Bisacodyl suppositiory rectally X 1 dose in 24 hours as needed   DULoxetine (CYMBALTA) 60 MG capsule Take 1 capsule (60 mg total) by mouth daily.   famotidine (PEPCID) 20 MG tablet Take 1 tablet (20 mg  total) by mouth daily.   Magnesium Hydroxide (MILK OF MAGNESIA PO) If no BM in 3 days, give 30 cc Milk of Magnesium p.o. x 1 dose in 24 hours as needed (Do not use standing constipation orders for residents with renal failure CFR less than 30. Contact MD for orders)   methocarbamol (ROBAXIN) 500 MG tablet Take 500 mg by mouth every 8 (eight) hours as needed for muscle spasms.   Nutritional Supplements (ENSURE PO) Take by mouth. TAKE 237 ML BY MOUTH TWICE DAILY FOR SUPPLEMENT   omeprazole (PRILOSEC) 40 MG capsule Take 40 mg by mouth  daily.   polyethylene glycol (MIRALAX / GLYCOLAX) 17 g packet Take 17 g by mouth daily as needed.   pregabalin (LYRICA) 150 MG capsule Take 1 capsule (150 mg total) by mouth 3 (three) times daily.   QUETIAPINE FUMARATE PO Take 200 mg by mouth at bedtime. For depression   senna-docusate (SENOKOT-S) 8.6-50 MG tablet Take 1 tablet by mouth at bedtime.   Sodium Phosphates (RA SALINE ENEMA RE) If not relieved by Biscodyl suppository, give disposable Saline Enema rectally X 1 dose/24 hrs as needed   [DISCONTINUED] oxyCODONE (OXYCONTIN) 20 mg 12 hr tablet Take 30 mg by mouth 2 (two) times daily as needed.   [DISCONTINUED] Oxycodone HCl 10 MG TABS Take 1 tablet (10 mg total) by mouth every 6 (six) hours as needed.   No facility-administered encounter medications on file as of 06/17/2021.    Review of Systems  Constitutional:  Negative for activity change, appetite change and fever.  HENT:  Negative for sore throat.   Eyes: Negative.   Cardiovascular:  Negative for chest pain and leg swelling.  Gastrointestinal:  Positive for nausea and vomiting. Negative for abdominal distention and diarrhea.  Genitourinary:  Negative for dysuria, frequency and urgency.  Skin:  Negative for color change.  Neurological:  Negative for dizziness and headaches.  Psychiatric/Behavioral:  Negative for behavioral problems and sleep disturbance. The patient is not nervous/anxious.       Immunization History  Administered Date(s) Administered   Influenza,inj,Quad PF,6+ Mos 01/28/2021   Pertinent  Health Maintenance Due  Topic Date Due   URINE MICROALBUMIN  Never done   COLONOSCOPY (Pts 45-44yrs Insurance coverage will need to be confirmed)  Never done   INFLUENZA VACCINE  Completed   Fall Risk 04/26/2021 04/26/2021 04/27/2021 04/28/2021 04/28/2021  Falls in the past year? - - - - -  Was there an injury with Fall? - - - - -  Fall Risk Category Calculator - - - - -  Fall Risk Category - - - - -  Patient Fall Risk  Level Moderate fall risk Moderate fall risk Moderate fall risk Moderate fall risk Moderate fall risk  Patient at Risk for Falls Due to - - - - -  Fall risk Follow up - - - - -     Vitals:   06/17/21 1000  BP: 134/86  Pulse: 71  Resp: 18  Weight: 148 lb (67.1 kg)   Body mass index is 20.64 kg/m.  Physical Exam Constitutional:      Appearance: Normal appearance.  HENT:     Head: Normocephalic and atraumatic.     Mouth/Throat:     Mouth: Mucous membranes are moist.  Eyes:     Conjunctiva/sclera: Conjunctivae normal.  Cardiovascular:     Rate and Rhythm: Normal rate and regular rhythm.     Pulses: Normal pulses.     Heart sounds: Normal heart sounds.  Pulmonary:     Effort: Pulmonary effort is normal.     Breath sounds: Normal breath sounds.  Abdominal:     General: Bowel sounds are normal.     Palpations: Abdomen is soft.  Musculoskeletal:        General: No swelling.     Cervical back: Normal range of motion.  Skin:    General: Skin is warm and dry.  Neurological:     General: No focal deficit present.     Mental Status: He is alert and oriented to person, place, and time.  Psychiatric:        Mood and Affect: Mood normal.        Behavior: Behavior normal.        Thought Content: Thought content normal.        Judgment: Judgment normal.       Labs reviewed: Recent Labs    11/24/20 2125 12/03/20 1202 04/21/21 0119 04/24/21 0050 04/27/21 0042 05/31/21 0000  NA  --    < > 138 136 136 140  K  --    < > 3.5 3.7 4.0 4.4  CL  --    < > 106 106 105 99  CO2  --    < > 26 23 24  25*  GLUCOSE  --    < > 147* 127* 100*  --   BUN  --    < > 9 10 10 11   CREATININE  --    < > 1.08 1.18 1.26* 1.0  CALCIUM  --    < > 8.5* 8.4* 8.8* 9.1  MG 2.2  --   --   --   --   --   PHOS 4.7*  --   --   --   --   --    < > = values in this interval not displayed.   Recent Labs    02/18/21 2121 02/21/21 1708 04/14/21 2124  AST 20 18 20   ALT 25 21 24   ALKPHOS 98 97 67   BILITOT 0.6 0.6 0.9  PROT 6.4* 7.3 6.7  ALBUMIN 3.5 3.9 3.7   Recent Labs    02/10/21 0817 02/18/21 2121 02/21/21 1708 04/14/21 1314 04/14/21 2124 04/22/21 0056 04/24/21 0050 04/27/21 0042 05/31/21 0000  WBC 13.8* 7.8   < > 9.1   < > 7.5 6.8 7.7 6.0  NEUTROABS 11.4* 4.3  --  6.5  --   --   --   --   --   HGB 11.2* 11.2*   < > 14.5   < > 9.6* 9.3* 10.0* 11.5*  HCT 34.6* 35.4*   < > 46.7   < > 29.7* 28.8* 32.0* 37*  MCV 84.2 84.5   < > 82.4   < > 82.3 82.5 83.6  --   PLT 312 236   < > 340   < > 330 329 358 199   < > = values in this interval not displayed.   Lab Results  Component Value Date   TSH 2.337 11/24/2020   Lab Results  Component Value Date   HGBA1C 5.6 04/15/2021   Lab Results  Component Value Date   CHOL 266 (H) 04/15/2021   HDL 31 (L) 04/15/2021   LDLCALC 159 (H) 04/15/2021   TRIG 378 (H) 04/15/2021   CHOLHDL 8.6 04/15/2021    Significant Diagnostic Results in last 30 days:  No results found.  Assessment/Plan  1. Nausea and vomiting, unspecified vomiting type -  will start on Zofran 4 mg 1 tab every 8 hours PRN  2. Diabetes mellitus type 2 with complications [facility]) Lab Results  Component Value Date   HGBA1C 5.6 04/15/2021   -   hgbA1C  10.6 on 06/10/21 -  continue Lantus, Metformin and Novolog -  monitor CBGs  3. S/P AKA (above knee amputation), left ([place]) -  continue PT and OT, for therapeutic strengthening exercises    Family/ staff Communication: Discussed plan of care with resident and charge nurse  Labs/tests ordered:   BMP    Durenda Age, DNP, MSN, FNP-BC [facility] [phone] (Monday-Friday 8:00 a.m. - 5:00 p.m.) [phone] (after hours)

## 2021-06-18 LAB — BASIC METABOLIC PANEL
BUN: 13 (ref 4–21)
CO2: 22 (ref 13–22)
Chloride: 103 (ref 99–108)
Creatinine: 0.9 (ref 0.6–1.3)
Glucose: 176
Potassium: 4.3 (ref 3.4–5.3)
Sodium: 139 (ref 137–147)

## 2021-06-18 LAB — COMPREHENSIVE METABOLIC PANEL
Calcium: 9.8 (ref 8.7–10.7)
GFR calc Af Amer: >90
GFR calc non Af Amer: >90

## 2021-06-21 ENCOUNTER — Other Ambulatory Visit: Payer: Self-pay | Admitting: Adult Health

## 2021-06-21 MED ORDER — OXYCODONE HCL ER 30 MG PO T12A: 30.0000 mg | EXTENDED_RELEASE_TABLET | Freq: Two times a day (BID) | ORAL | 0 refills | Status: DC | PRN

## 2021-06-22 LAB — CBC AND DIFFERENTIAL
HCT: 36 — AB (ref 41–53)
Hemoglobin: 11.9 — AB (ref 13.5–17.5)
Neutrophils Absolute: 2.9
Platelets: 238 (ref 150–399)
WBC: 5.7

## 2021-06-22 LAB — CBC: RBC: 4.6 (ref 3.87–5.11)

## 2021-06-23 ENCOUNTER — Encounter (HOSPITAL_COMMUNITY): Payer: Medicare Other

## 2021-06-23 ENCOUNTER — Other Ambulatory Visit: Payer: Self-pay | Admitting: Adult Health

## 2021-06-23 ENCOUNTER — Ambulatory Visit: Payer: Medicare Other

## 2021-06-23 DIAGNOSIS — G8928 Other chronic postprocedural pain: Secondary | ICD-10-CM

## 2021-06-23 MED ORDER — OXYCODONE HCL ER 30 MG PO T12A: 30.0000 mg | EXTENDED_RELEASE_TABLET | Freq: Two times a day (BID) | ORAL | 0 refills | Status: DC

## 2021-06-23 MED ORDER — OXYCODONE HCL 10 MG PO TABS: 10.0000 mg | ORAL_TABLET | Freq: Four times a day (QID) | ORAL | 0 refills | Status: DC | PRN

## 2021-06-24 ENCOUNTER — Other Ambulatory Visit (HOSPITAL_COMMUNITY): Payer: Self-pay

## 2021-06-30 LAB — BASIC METABOLIC PANEL
BUN: 11 (ref 4–21)
CO2: 28 — AB (ref 13–22)
Chloride: 103 (ref 99–108)
Creatinine: 1 (ref 0.6–1.3)
Glucose: 73
Potassium: 4.1 (ref 3.4–5.3)
Sodium: 137 (ref 137–147)

## 2021-06-30 LAB — COMPREHENSIVE METABOLIC PANEL: Calcium: 9.2 (ref 8.7–10.7)

## 2021-07-02 ENCOUNTER — Non-Acute Institutional Stay (INDEPENDENT_AMBULATORY_CARE_PROVIDER_SITE_OTHER): Payer: Medicare Other | Admitting: Adult Health

## 2021-07-02 ENCOUNTER — Encounter: Payer: Self-pay | Admitting: Adult Health

## 2021-07-02 DIAGNOSIS — Z0000 Encounter for general adult medical examination without abnormal findings: Secondary | ICD-10-CM | POA: Diagnosis not present

## 2021-07-02 NOTE — Progress Notes (Signed)
Subjective:   Jose Hansen is a 51 y.o. male who presents for Medicare Annual/Subsequent preventive examination.  Review of Systems     Cardiac Risk Factors include: diabetes mellitus;male gender Body mass index is 20.64 kg/m.     Objective:    Today's Vitals   07/02/21 0947 07/02/21 1125  BP: 107/64   Pulse: 74   Resp: 19   Temp: (!) 97.3 F (36.3 C)   Weight: 148 lb (67.1 kg)   Height: 5\' 11"  (1.803 m)   PainSc:  7    Body mass index is 20.64 kg/m.  Advanced Directives 07/02/2021 06/09/2021 05/20/2021 05/10/2021 05/06/2021 04/30/2021 04/14/2021  Does Patient Have a Medical Advance Directive? No No No No No No No  Would patient like information on creating a medical advance directive? - No - Patient declined No - Patient declined No - Patient declined No - Patient declined No - Patient declined Yes (Inpatient - patient requests chaplain consult to create a medical advance directive)    Current Medications (verified) Outpatient Encounter Medications as of 07/02/2021  Medication Sig   acetaminophen (TYLENOL) 325 MG tablet Take 2 tablets (650 mg total) by mouth every 6 (six) hours as needed for moderate pain or mild pain.   AMLODIPINE BESYLATE PO Take 5 mg by mouth daily.   apixaban (ELIQUIS) 5 MG TABS tablet Take 1 tablet (5 mg total) by mouth 2 (two) times daily.   aspirin EC 81 MG tablet Take 81 mg by mouth daily. Swallow whole.   atorvastatin (LIPITOR) 40 MG tablet Take 1 tablet (40 mg total) by mouth at bedtime.   calcium carbonate (TUMS EX) 750 MG chewable tablet Chew 1 tablet by mouth daily.   DULoxetine (CYMBALTA) 60 MG capsule Take 1 capsule (60 mg total) by mouth daily.   famotidine (PEPCID) 20 MG tablet Take 1 tablet (20 mg total) by mouth daily.   insulin aspart (NOVOLOG) 100 UNIT/ML injection Inject 5 Units into the skin. For blood glucose greater than or equal to 300   insulin glargine (LANTUS) 100 UNIT/ML injection Inject 10 Units into the skin at bedtime.    melatonin 3 MG TABS tablet Take 3 mg by mouth at bedtime.   METFORMIN HCL PO Take 1,000 mg by mouth 2 (two) times daily.   methocarbamol (ROBAXIN) 500 MG tablet Take 500 mg by mouth every 8 (eight) hours as needed for muscle spasms.   Nutritional Supplements (ENSURE PO) Take by mouth. TAKE 237 ML BY MOUTH TWICE DAILY FOR SUPPLEMENT   nystatin (MYCOSTATIN) 100000 UNIT/ML suspension Take by mouth 4 (four) times daily.   omeprazole (PRILOSEC) 40 MG capsule Take 40 mg by mouth daily.   ondansetron (ZOFRAN) 4 MG tablet Take 4 mg by mouth every 8 (eight) hours as needed for nausea or vomiting.   Oxycodone HCl 10 MG TABS Take 1 tablet (10 mg total) by mouth every 6 (six) hours as needed.   Oxycodone HCl 20 MG TABS Take 20 mg by mouth. ADMINISTER 1& 1/2 TABS (TO EQUAL 30MG ) TWICE DAILY FOR PAIN   polyethylene glycol (MIRALAX / GLYCOLAX) 17 g packet Take 17 g by mouth daily as needed.   pregabalin (LYRICA) 150 MG capsule Take 1 capsule (150 mg total) by mouth 3 (three) times daily.   QUETIAPINE FUMARATE PO Take 200 mg by mouth at bedtime. For depression   senna-docusate (SENOKOT-S) 8.6-50 MG tablet Take 1 tablet by mouth at bedtime.   [DISCONTINUED] bisacodyl (DULCOLAX) 10 MG suppository If  not relieved by MOM, give 10 mg Bisacodyl suppositiory rectally X 1 dose in 24 hours as needed   [DISCONTINUED] Magnesium Hydroxide (MILK OF MAGNESIA PO) If no BM in 3 days, give 30 cc Milk of Magnesium p.o. x 1 dose in 24 hours as needed (Do not use standing constipation orders for residents with renal failure CFR less than 30. Contact MD for orders)   [DISCONTINUED] oxyCODONE 30 MG 12 hr tablet Take 1 tablet (30 mg total) by mouth 2 (two) times daily.   [DISCONTINUED] Sodium Phosphates (RA SALINE ENEMA RE) If not relieved by Biscodyl suppository, give disposable Saline Enema rectally X 1 dose/24 hrs as needed   No facility-administered encounter medications on file as of 07/02/2021.    Allergies (verified) Contrast  media [iodinated contrast media]   History: Past Medical History:  Diagnosis Date   Anxiety    Atypical chest pain 01/13/2021   Clotting disorder ([place])    Depression    H/O blood clots    Hypertension    Left leg weakness 11/24/2020   Past Surgical History:  Procedure Laterality Date   ABDOMINAL AORTOGRAM W/LOWER EXTREMITY N/A 01/18/2021   Procedure: ABDOMINAL AORTOGRAM W/LOWER EXTREMITY;  Surgeon: Waynetta Sandy, MD;  Location: [facility];  Service: Cardiovascular;  Laterality: N/A;   AMPUTATION Left 04/15/2021   Procedure: LEFT ABOVE KNEE AMPUTATION;  Surgeon: Cherre Robins, MD;  Location: [facility];  Service: Vascular;  Laterality: Left;   BELOW KNEE LEG AMPUTATION Right 2018   FEMORAL-TIBIAL BYPASS GRAFT Left 01/20/2021   Procedure: BYPASS GRAFT FEMORAL-PT ARTERY LEFT USING 6 MM X 80 CM GORE PROPATEN VASCULAR GRAFT REMOVABLE RING;  Surgeon: Waynetta Sandy, MD;  Location: [facility];  Service: Vascular;  Laterality: Left;   WOUND DEBRIDEMENT Left 02/09/2021   Procedure: LEFT LEG WASHOUT AND WOUND CLOSURE;  Surgeon: Waynetta Sandy, MD;  Location: [facility];  Service: Vascular;  Laterality: Left;   Family History  Problem Relation Age of Onset   CAD Neg Hx    Hypertension Neg Hx    Social History   Socioeconomic History   Marital status: Single    Spouse name: Not on file   Number of children: Not on file   Years of education: Not on file   Highest education level: Not on file  Occupational History   Not on file  Tobacco Use   Smoking status: Former    Types: Cigarettes   Smokeless tobacco: Never  Vaping Use   Vaping Use: Never used  Substance and Sexual Activity   Alcohol use: Not Currently   Drug use: Not Currently   Sexual activity: Yes  Other Topics Concern   Not on file  Social History Narrative   Not on file   Social Determinants of Health   Financial Resource Strain: Not on file  Food Insecurity: Food Insecurity Present    Worried About [place] in the Last Year: Sometimes true   Ran Out of Food in the Last Year: Sometimes true  Transportation Needs: Unmet Transportation Needs   Lack of Transportation (Medical): Yes   Lack of Transportation (Non-Medical): No  Physical Activity: Not on file  Stress: Not on file  Social Connections: Not on file    Tobacco Counseling Counseling given: Not Answered   Clinical Intake:  Pre-visit preparation completed: No  Pain : 0-10 Pain Score: 7  Pain Type: Chronic pain Pain Location: Leg Pain Orientation: Left, Right Pain Descriptors / Indicators:  Aching Pain Onset: More than a month ago Pain Frequency: Constant Pain Relieving Factors: Pain medication Effect of Pain on Daily Activities: continues with daily activities  Pain Relieving Factors: Pain medication  BMI - recorded: 20.64 Nutritional Status: BMI of 19-24  Normal Diabetes: Yes CBG done?: Yes CBG resulted in Enter/ Edit results?: Yes Did pt. bring in CBG monitor from home?: Yes (lives in SNF) Glucose Meter Downloaded?: Yes  How often do you need to have someone help you when you read instructions, pamphlets, or other written materials from your doctor or pharmacy?: 3 - Sometimes What is the last grade level you completed in school?: 11th grade  Diabetic? Yes  Interpreter Needed?: No  Information entered by :: Livi Mcgann Medina-Vargas NP   Activities of Daily Living In your present state of health, do you have any difficulty performing the following activities: 07/02/2021 04/14/2021  Hearing? N N  Vision? N N  Difficulty concentrating or making decisions? N N  Walking or climbing stairs? Y Y  Comment Bilateral AKA -  Dressing or bathing? N N  Doing errands, shopping? Tempie Donning  Preparing Food and eating ? Y -  Using the Toilet? N -  In the past six months, have you accidently leaked urine? N -  Do you have problems with loss of bowel control? N -  Managing your Medications? Y -   Managing your Finances? Y -  Housekeeping or managing your Housekeeping? Y -  Some recent data might be hidden    Patient Care Team: [name], MD as PCP - General (Internal Medicine) Drema Pry as Social Worker [place], Cleophus Molt as Social Worker  Indicate any recent [employer] you may have received from other than [facility] providers in the past year (date may be approximate).     Assessment:   This is a routine wellness examination for [place].  Hearing/Vision screen No results found.  Dietary issues and exercise activities discussed: Current Exercise Habits: Structured exercise class, Type of exercise: strength training/weights;stretching, Time (Minutes): 30, Frequency (Times/Week): 3, Weekly Exercise (Minutes/Week): 90, Intensity: Mild, Exercise limited by: orthopedic condition(s)   Goals Addressed               This Visit's Progress     Exercise 3x per week (30 min per time) (pt-stated)         Sit ups  Exercise th bilateral AKA 3.    Arm stretching       Depression Screen PHQ 2/9 Scores 07/02/2021 02/05/2021 02/04/2021 01/04/2021 12/03/2020  PHQ - 2 Score 4 1 5 4 6   PHQ- 9 Score 10 2 17 18 19     Fall Risk Fall Risk  07/02/2021 02/04/2021 12/03/2020  Falls in the past year? 1 1 1   Number falls in past yr: 1 1 1   Injury with Fall? 1 0 0  Risk for fall due to : History of fall(s);Impaired mobility History of fall(s);Impaired balance/gait;Impaired mobility History of fall(s);Impaired balance/gait;Impaired mobility  Follow up Education provided;Falls prevention discussed Falls evaluation completed Falls prevention discussed    FALL RISK PREVENTION PERTAINING TO THE HOME:  Any stairs in or around the home? No  If so, are there any without handrails? No  Home free of loose throw rugs in walkways, pet beds, electrical cords, etc? Yes  Adequate lighting in your home to reduce risk of falls? Yes   ASSISTIVE DEVICES UTILIZED TO PREVENT FALLS:  Life alert? No  Use of a  cane, walker or w/c? Yes  Grab bars in the bathroom? Yes  Shower chair or bench in shower? Yes  Elevated toilet seat or a handicapped toilet? Yes   TIMED UP AND GO:  Was the test performed? No .  Length of time to ambulate 10 feet: N/A sec.   Gait unsteady with use of assistive device, provider informed and education provided.   Cognitive Function: MMSE - Mini Mental State Exam 07/02/2021  Orientation to time 5  Orientation to Place 5  Registration 3  Attention/ Calculation 5  Recall 3  Language- name 2 objects 2  Language- repeat 1  Language- follow 3 step command 3  Language- read & follow direction 1  Write a sentence 1  Copy design 1  Total score 30     6CIT Screen 07/02/2021  What Year? 0 points  What month? 0 points  What time? 3 points  Count back from 20 0 points  Months in reverse 4 points  Repeat phrase 10 points  Total Score 17    Immunizations Immunization History  Administered Date(s) Administered   Influenza,inj,Quad PF,6+ Mos 01/28/2021    TDAP status: Due, Education has been provided regarding the importance of this vaccine. Advised may receive this vaccine at local pharmacy or Health Dept. Aware to provide a copy of the vaccination record if obtained from local pharmacy or Health Dept. Verbalized acceptance and understanding.  Flu Vaccine status: Declined, Education has been provided regarding the importance of this vaccine but patient still declined. Advised may receive this vaccine at local pharmacy or Health Dept. Aware to provide a copy of the vaccination record if obtained from local pharmacy or Health Dept. Verbalized acceptance and understanding.  Pneumococcal vaccine status: Declined,  Education has been provided regarding the importance of this vaccine but patient still declined. Advised may receive this vaccine at local pharmacy or Health Dept. Aware to provide a copy of the vaccination record if obtained from local pharmacy or Health Dept.  Verbalized acceptance and understanding.   Covid-19 vaccine status: Declined, Education has been provided regarding the importance of this vaccine but patient still declined. Advised may receive this vaccine at local pharmacy or Health Dept.or vaccine clinic. Aware to provide a copy of the vaccination record if obtained from local pharmacy or Health Dept. Verbalized acceptance and understanding.  Qualifies for Shingles Vaccine? No   Zostavax completed No   Shingrix Completed?: No.    Education has been provided regarding the importance of this vaccine. Patient has been advised to call insurance company to determine out of pocket expense if they have not yet received this vaccine. Advised may also receive vaccine at local pharmacy or Health Dept. Verbalized acceptance and understanding.  Screening Tests Health Maintenance  Topic Date Due   COVID-19 Vaccine (1) Never done   URINE MICROALBUMIN  Never done   COLONOSCOPY (Pts 45-71yrs Insurance coverage will need to be confirmed)  Never done   Zoster Vaccines- Shingrix (1 of 2) 08/08/2021 (Originally 12/01/1989)   TETANUS/TDAP  05/10/2022 (Originally 12/01/1989)   INFLUENZA VACCINE  Completed   Hepatitis C Screening  Completed   HIV Screening  Completed   HPV VACCINES  Aged Out    Health Maintenance  Health Maintenance Due  Topic Date Due   COVID-19 Vaccine (1) Never done   URINE MICROALBUMIN  Never done   COLONOSCOPY (Pts 45-64yrs Insurance coverage will need to be confirmed)  Never done    Colorectal cancer screening: Type of screening: FOBT/FIT. Completed  . Repeat every 1  years ordered today  Lung Cancer Screening: (Low Dose CT Chest recommended if Age 67-80 years, 30 pack-year currently smoking OR have quit w/in 15years.) does not qualify.   Lung Cancer Screening Referral: N/A  Additional Screening:  Hepatitis C Screening: does qualify; Completed 06/11/21  Vision Screening: Recommended annual ophthalmology exams for early  detection of glaucoma and other disorders of the eye. Is the patient up to date with their annual eye exam?  No  Who is the provider or what is the name of the office in which the patient attends annual eye exams? N/A If pt is not established with a provider, would they like to be referred to a provider to establish care? Yes .   Dental Screening: Recommended annual dental exams for proper oral hygiene  Community Resource Referral / Chronic Care Management: CRR required this visit?  No   CCM required this visit?  No      Plan:     I have personally reviewed and noted the following in the patients chart:   Medical and social history Use of alcohol, tobacco or illicit drugs  Current medications and supplements including opioid prescriptions. Patient is currently taking opioid prescriptions. Information provided to patient regarding non-opioid alternatives. Patient advised to discuss non-opioid treatment plan with their provider. Functional ability and status Nutritional status Physical activity Advanced directives List of other physicians Hospitalizations, surgeries, and ER visits in previous 12 months Vitals Screenings to include cognitive, depression, and falls Referrals and appointments  In addition, I have reviewed and discussed with patient certain preventive protocols, quality metrics, and best practice recommendations. A written personalized care plan for preventive services as well as general preventive health recommendations were provided to patient.     Soniya Ashraf Medina-Vargas, NP   07/02/2021   Nurse Notes:  Needs  annual wellness visit yearly.

## 2021-07-06 ENCOUNTER — Encounter: Payer: Medicare Other | Admitting: Student

## 2021-07-07 ENCOUNTER — Other Ambulatory Visit: Payer: Self-pay | Admitting: Adult Health

## 2021-07-07 MED ORDER — OXYCODONE HCL ER 30 MG PO T12A: 1.0000 | EXTENDED_RELEASE_TABLET | Freq: Two times a day (BID) | ORAL | 0 refills | Status: DC

## 2021-07-08 ENCOUNTER — Encounter: Payer: Self-pay | Admitting: Adult Health

## 2021-07-08 ENCOUNTER — Non-Acute Institutional Stay (SKILLED_NURSING_FACILITY): Payer: Medicare Other | Admitting: Adult Health

## 2021-07-08 DIAGNOSIS — K219 Gastro-esophageal reflux disease without esophagitis: Secondary | ICD-10-CM

## 2021-07-08 DIAGNOSIS — F339 Major depressive disorder, recurrent, unspecified: Secondary | ICD-10-CM

## 2021-07-08 DIAGNOSIS — D62 Acute posthemorrhagic anemia: Secondary | ICD-10-CM

## 2021-07-08 DIAGNOSIS — Z89612 Acquired absence of left leg above knee: Secondary | ICD-10-CM

## 2021-07-08 DIAGNOSIS — E118 Type 2 diabetes mellitus with unspecified complications: Secondary | ICD-10-CM | POA: Diagnosis not present

## 2021-07-08 DIAGNOSIS — G8928 Other chronic postprocedural pain: Secondary | ICD-10-CM | POA: Diagnosis not present

## 2021-07-08 DIAGNOSIS — I82591 Chronic embolism and thrombosis of other specified deep vein of right lower extremity: Secondary | ICD-10-CM

## 2021-07-08 DIAGNOSIS — I10 Essential (primary) hypertension: Secondary | ICD-10-CM | POA: Diagnosis not present

## 2021-07-08 NOTE — Progress Notes (Signed)
Location:  [place] Room Number: 113-B Place of Service:  SNF (31) Provider:  Durenda Age, DNP, FNP-BC  Patient Care Team: [name], MD as PCP - General (Internal Medicine) Drema Pry as Social Worker [place], Cleophus Molt as Social Worker [name], RN as Case Manager  Extended Emergency Contact Information Primary Emergency Contact: Farrugia,BERNARD Address: [address] [place]          Kathyrn Lass Mobile Phone: [phone] Relation: Brother Secondary Emergency Contact: Kaing,Kim Mobile Phone: [phone] Relation: Other Preferred language: English Interpreter needed? No  Code Status:  Full Code  Goals of care: Advanced Directive information Advanced Directives 07/08/2021  Does Patient Have a Medical Advance Directive? No  Would patient like information on creating a medical advance directive? No - Patient declined     Chief Complaint  Patient presents with   Acute Visit    Possible discharge    HPI:  Pt is a 51 y.o. male seen today for possible discharge. He was admitted to [facility] on 04/28/21 post hospital admission 04/14/2021 to 04/28/2021.  He has a PMH of hypertension, hyperlipidemia, and severe PVD/PAD s/p right AKA.  He has been having progressively worsening stabbing chronic pain on his left leg.  He has been taking Lyrica and Percocet at home without relief of pain.  He has a history of previous right AKA 5 years ago and femoral to posterior tibial artery bypass with prosthetic graft in August 2022.  He was a scheduled for a left leg amputation on 12/03 with vascular.  Vascular surgery was consulted and recommended no further imaging and for the patient to undergo an amputation.  He had left AKA with vascular surgery on 04/15/2021.  He was initially placed on PCA pump for postop pain control.  However, he was weaned off.  He was weaned off Dilaudid after patient was put on OxyContin 30 mg  twice a day and oxycodone 15 mg every 6 hours.  At [facility], Oxycodone IR dosage was decreased to 10 mg PO every 6 hours PRN and continues to take Oxycontin ER 30 mg PO Q 12 hours, Lyrica 150 mg three times a day and Robaxin 500 mg every 8 hours PRN. He was recently started on Lantus, Metformin and Novolog for diabetes mellitus.  He has a shrinker on his left AKA stump.   Past Medical History:  Diagnosis Date   Anxiety    Atypical chest pain 01/13/2021   Clotting disorder ([place])    Depression    H/O blood clots    Hypertension    Left leg weakness 11/24/2020   Past Surgical History:  Procedure Laterality Date   ABDOMINAL AORTOGRAM W/LOWER EXTREMITY N/A 01/18/2021   Procedure: ABDOMINAL AORTOGRAM W/LOWER EXTREMITY;  Surgeon: Waynetta Sandy, MD;  Location: [place] CV LAB;  Service: Cardiovascular;  Laterality: N/A;   AMPUTATION Left 04/15/2021   Procedure: LEFT ABOVE KNEE AMPUTATION;  Surgeon: Cherre Robins, MD;  Location: [place];  Service: Vascular;  Laterality: Left;   BELOW KNEE LEG AMPUTATION Right 2018   FEMORAL-TIBIAL BYPASS GRAFT Left 01/20/2021   Procedure: BYPASS GRAFT FEMORAL-PT ARTERY LEFT USING 6 MM X 80 CM GORE PROPATEN VASCULAR GRAFT REMOVABLE RING;  Surgeon: Waynetta Sandy, MD;  Location: [place];  Service: Vascular;  Laterality: Left;   WOUND DEBRIDEMENT Left 02/09/2021   Procedure: LEFT LEG WASHOUT AND WOUND CLOSURE;  Surgeon: Waynetta Sandy, MD;  Location: [place];  Service: Vascular;  Laterality: Left;    Allergies  Allergen Reactions   Contrast Media [Iodinated Contrast Media] Nausea Only    NOT AN ALLERGY. ONLY NAUSEA. 12/11/20  Newly reported by [facility] on 12/04/20.    Outpatient Encounter Medications as of 07/08/2021  Medication Sig   acetaminophen (TYLENOL) 325 MG tablet Take 2 tablets (650 mg total) by mouth every 6 (six) hours as needed for moderate pain or mild pain.   AMLODIPINE BESYLATE PO Take 5 mg by mouth  daily.   apixaban (ELIQUIS) 5 MG TABS tablet Take 1 tablet (5 mg total) by mouth 2 (two) times daily.   aspirin EC 81 MG tablet Take 81 mg by mouth daily. Swallow whole.   atorvastatin (LIPITOR) 40 MG tablet Take 1 tablet (40 mg total) by mouth at bedtime.   calcium carbonate (TUMS EX) 750 MG chewable tablet Chew 1 tablet by mouth daily.   Carboxymethylcellulose Sodium (ARTIFICIAL TEARS OP) Apply to eye. GIVE 2 GTTS IN [facility] EYE   DULoxetine (CYMBALTA) 60 MG capsule Take 1 capsule (60 mg total) by mouth daily.   famotidine (PEPCID) 20 MG tablet Take 1 tablet (20 mg total) by mouth daily.   insulin aspart (NOVOLOG) 100 UNIT/ML injection Inject 5 Units into the skin. For blood glucose greater than or equal to 300   insulin glargine (LANTUS) 100 UNIT/ML injection Inject 10 Units into the skin at bedtime.   melatonin 3 MG TABS tablet Take 3 mg by mouth at bedtime.   melatonin 5 MG TABS Take 5 mg by mouth at bedtime as needed.   METFORMIN HCL PO Take 1,000 mg by mouth 2 (two) times daily.   methocarbamol (ROBAXIN) 500 MG tablet Take 500 mg by mouth every 8 (eight) hours as needed for muscle spasms.   Nutritional Supplements (ENSURE PO) Take by mouth. TAKE 237 ML BY MOUTH TWICE DAILY FOR SUPPLEMENT   omeprazole (PRILOSEC) 40 MG capsule Take 40 mg by mouth daily.   ondansetron (ZOFRAN) 4 MG tablet Take 4 mg by mouth every 8 (eight) hours as needed for nausea or vomiting.   oxyCODONE 30 MG 12 hr tablet Take 1 tablet (30 mg total) by mouth every 12 (twelve) hours.   Oxycodone HCl 10 MG TABS Take 1 tablet (10 mg total) by mouth every 6 (six) hours as needed.   polyethylene glycol (MIRALAX / GLYCOLAX) 17 g packet Take 17 g by mouth daily as needed.   QUETIAPINE FUMARATE PO Take 200 mg by mouth at bedtime. For depression   senna-docusate (SENOKOT-S) 8.6-50 MG tablet Take 1 tablet by mouth at bedtime.   [DISCONTINUED] nystatin (MYCOSTATIN) 100000 UNIT/ML suspension Take by mouth 4 (four) times daily.    [DISCONTINUED] pregabalin (LYRICA) 150 MG capsule Take 1 capsule (150 mg total) by mouth 3 (three) times daily.   No facility-administered encounter medications on file as of 07/08/2021.    Review of Systems  Constitutional:  Negative for activity change, appetite change and fever.  HENT:  Negative for sore throat.   Eyes: Negative.   Cardiovascular:  Negative for chest pain and leg swelling.  Gastrointestinal:  Negative for abdominal distention, diarrhea and vomiting.  Genitourinary:  Negative for dysuria, frequency and urgency.  Skin:  Negative for color change.  Neurological:  Negative for dizziness and headaches.  Psychiatric/Behavioral:  Negative for behavioral problems and sleep disturbance. The patient is not nervous/anxious.       Immunization History  Administered Date(s) Administered   Influenza,inj,Quad PF,6+ Mos 01/28/2021  Pertinent  Health Maintenance Due  Topic Date Due   URINE MICROALBUMIN  Never done   COLONOSCOPY (Pts 45-59yrs Insurance coverage will need to be confirmed)  Never done   INFLUENZA VACCINE  Completed   Fall Risk 04/26/2021 04/27/2021 04/28/2021 04/28/2021 07/02/2021  Falls in the past year? - - - - 1  Was there an injury with Fall? - - - - 1  Fall Risk Category Calculator - - - - 3  Fall Risk Category - - - - High  Patient Fall Risk Level Moderate fall risk Moderate fall risk Moderate fall risk Moderate fall risk High fall risk  Patient at Risk for Falls Due to - - - - History of fall(s);Impaired mobility  Fall risk Follow up - - - - Education provided;Falls prevention discussed     Vitals:   07/08/21 0909  BP: (!) 156/77  Pulse: 72  Resp: 19  Temp: (!) 97.5 F (36.4 C)  SpO2: 98%  Weight: 204 lb 12.8 oz (92.9 kg)  Height: 5\' 11"  (1.803 m)   Body mass index is 28.56 kg/m.  Physical Exam Constitutional:      Appearance: Normal appearance.  HENT:     Head: Normocephalic and atraumatic.     Mouth/Throat:     Mouth: Mucous membranes  are moist.  Eyes:     Conjunctiva/sclera: Conjunctivae normal.  Cardiovascular:     Rate and Rhythm: Normal rate and regular rhythm.     Pulses: Normal pulses.     Heart sounds: Normal heart sounds.  Pulmonary:     Effort: Pulmonary effort is normal.     Breath sounds: Normal breath sounds.  Abdominal:     General: Bowel sounds are normal.     Palpations: Abdomen is soft.  Musculoskeletal:        General: No swelling. Normal range of motion.     Cervical back: Normal range of motion.     Comments: Bilateral AKA  Skin:    General: Skin is warm and dry.  Neurological:     General: No focal deficit present.     Mental Status: He is alert and oriented to person, place, and time.  Psychiatric:        Mood and Affect: Mood normal.        Behavior: Behavior normal.        Thought Content: Thought content normal.        Judgment: Judgment normal.       Labs reviewed: Recent Labs    11/24/20 2125 12/03/20 1202 04/21/21 0119 04/24/21 0050 04/27/21 0042 05/31/21 0000 06/17/21 0000 06/18/21 0000 06/30/21 0000  NA  --    < > 138 136 136   < > 138 139 137  K  --    < > 3.5 3.7 4.0   < > 4.0 4.3 4.1  CL  --    < > 106 106 105   < > 102 103 103  CO2  --    < > 26 23 24    < > 22 22 28*  GLUCOSE  --    < > 147* 127* 100*  --   --   --   --   BUN  --    < > 9 10 10    < > 14 13 11   CREATININE  --    < > 1.08 1.18 1.26*   < > 0.9 0.9 1.0  CALCIUM  --    < > 8.5* 8.4*  8.8*   < > 9.6 9.8 9.2  MG 2.2  --   --   --   --   --   --   --   --   PHOS 4.7*  --   --   --   --   --   --   --   --    < > = values in this interval not displayed.   Recent Labs    02/18/21 2121 02/21/21 1708 04/14/21 2124 06/17/21 0000  AST 20 18 20  34  ALT 25 21 24  38  ALKPHOS 98 97 67 98  BILITOT 0.6 0.6 0.9  --   PROT 6.4* 7.3 6.7  --   ALBUMIN 3.5 3.9 3.7 4.5   Recent Labs    04/22/21 0056 04/24/21 0050 04/27/21 0042 05/31/21 0000 06/09/21 0000 06/17/21 0000 06/22/21 0000  WBC 7.5 6.8 7.7    < > 7.7 7.5 5.7  NEUTROABS  --   --   --   --  5.20 4.90 2.90  HGB 9.6* 9.3* 10.0*   < > 12.2* 11.9* 11.9*  HCT 29.7* 28.8* 32.0*   < > 39* 38* 36*  MCV 82.3 82.5 83.6  --   --   --   --   PLT 330 329 358   < > 280 259 238   < > = values in this interval not displayed.   Lab Results  Component Value Date   TSH 2.337 11/24/2020   Lab Results  Component Value Date   HGBA1C 10.6 06/10/2021   Lab Results  Component Value Date   CHOL 266 (H) 04/15/2021   HDL 31 (L) 04/15/2021   LDLCALC 159 (H) 04/15/2021   TRIG 378 (H) 04/15/2021   CHOLHDL 8.6 04/15/2021    Significant Diagnostic Results in last 30 days:  No results found.  Assessment/Plan  1. 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 Other chronic postprocedural pain -   Continue pregabalin 150 mg 1 capsule 3 times a day, OxyContin ER 30 mg 1 tab every 12 hours, oxycodone IR 10 mg 1 tab every 6 hours PRN, Robaxin 500 mg 1 tab every 8 hours PRN -   Referred to pain clinic  4. Essential hypertension -Continue amlodipine 5 mg 1 tab daily  5. Gastroesophageal reflux disease without esophagitis -   Continue omeprazole 40 mg 1 capsule daily and famotidine 20 mg 1 tab daily  6. Acute blood loss anemia Lab Results  Component Value Date   WBC 5.7 06/22/2021   HGB 11.9 (A) 06/22/2021   HCT 36 (A) 06/22/2021   MCV 83.6 04/27/2021   PLT 238 06/22/2021   -   Stable  7. Chronic deep vein thrombosis (DVT) of other vein of right lower extremity (HCC) -Continue Eliquis 5 mg 1 tab twice a day  8.  Major  depression, recurrent, chronic (HCC) -    Continue duloxetine 60 mg 1 capsule daily and quetiapine 200 mg 1 tab at bedtime  Family/ staff Communication: Discussed plan of care with resident and charge nurse  Labs/tests ordered:   None    Durenda Age, DNP, MSN, FNP-BC [facility] [phone] (Monday-Friday 8:00 a.m. - 5:00 p.m.) [phone] (after hours)

## 2021-07-12 ENCOUNTER — Other Ambulatory Visit: Payer: Self-pay | Admitting: Adult Health

## 2021-07-12 MED ORDER — PREGABALIN 150 MG PO CAPS: 150.0000 mg | ORAL_CAPSULE | Freq: Three times a day (TID) | ORAL | 0 refills | Status: DC

## 2021-07-19 ENCOUNTER — Encounter: Payer: Self-pay | Admitting: Adult Health

## 2021-07-19 ENCOUNTER — Telehealth: Payer: Self-pay | Admitting: Internal Medicine

## 2021-07-19 ENCOUNTER — Non-Acute Institutional Stay (SKILLED_NURSING_FACILITY): Payer: Medicare Other | Admitting: Adult Health

## 2021-07-19 DIAGNOSIS — M25511 Pain in right shoulder: Secondary | ICD-10-CM

## 2021-07-19 DIAGNOSIS — G894 Chronic pain syndrome: Secondary | ICD-10-CM

## 2021-07-19 DIAGNOSIS — I82591 Chronic embolism and thrombosis of other specified deep vein of right lower extremity: Secondary | ICD-10-CM | POA: Diagnosis not present

## 2021-07-19 NOTE — Telephone Encounter (Signed)
-----   Message from Lacinda Axon, MD sent at 07/15/2021 10:23 AM EST ----- Regarding: Follow-up appointment Please call patient to schedule another appointment.  Missed his appointment on February 7th.  Emphasize to him that we need to see him in person in order to get him qualified for a wheelchair.  We are unable to order him a wheelchair without a face-to-face visit.  Thanks. ~[place]

## 2021-07-19 NOTE — Telephone Encounter (Signed)
Please refer to message below.  Just spoke with patient and he is still a patient at [facility].  Told him to give [place] a call when he is discharged from the facility to get an appointment face-to-face for a wheelchair.

## 2021-07-19 NOTE — Progress Notes (Signed)
Location:  [place] Room Number: 113-B Place of Service:  SNF (31) Provider:  Durenda Age, DNP, FNP-BC  Patient Care Team: [name], MD as PCP - General (Internal Medicine) Drema Pry as Social Worker [place], Cleophus Molt as Social Worker [name], RN as Case Manager  Extended Emergency Contact Information Primary Emergency Contact: Moisan,BERNARD Address: [address] [place]          Kathyrn Lass Mobile Phone: [phone] Relation: Brother Secondary Emergency Contact: Kaing,Kim Mobile Phone: [phone] Relation: Other Preferred language: English Interpreter needed? No  Code Status:  Full Code  Goals of care: Advanced Directive information Advanced Directives 07/19/2021  Does Patient Have a Medical Advance Directive? No  Would patient like information on creating a medical advance directive? No - Patient declined     Chief Complaint  Patient presents with   Acute Visit    Shoulder pain    HPI:  Pt is a 51 y.o. male seen today for an acute visit regarding right shoulder pain. He rated pain as 10/10. No reported fall nor fall incident. He has bilateral AKA. He uses his arm in propelling his wheelchair. He transfers in and out of his wheelchair independently. He has history of RLE DVT and currently on Eliquis 5 mg BID. No bruising nor bleeding noted. He has chronic pain and currently takes Oxycontin ER 30 mg every 12 hours and Oxycodone IR 10 mg every 6 hours PRN.                                                                                                                                                 Past Medical History:  Diagnosis Date   Anxiety    Atypical chest pain 01/13/2021   Clotting disorder ([place])    Depression    H/O blood clots    Hypertension    Left leg weakness 11/24/2020   Past Surgical History:  Procedure Laterality Date   ABDOMINAL AORTOGRAM W/LOWER EXTREMITY N/A 01/18/2021   Procedure:  ABDOMINAL AORTOGRAM W/LOWER EXTREMITY;  Surgeon: Waynetta Sandy, MD;  Location: [place] CV LAB;  Service: Cardiovascular;  Laterality: N/A;   AMPUTATION Left 04/15/2021   Procedure: LEFT ABOVE KNEE AMPUTATION;  Surgeon: Cherre Robins, MD;  Location: [place];  Service: Vascular;  Laterality: Left;   BELOW KNEE LEG AMPUTATION Right 2018   FEMORAL-TIBIAL BYPASS GRAFT Left 01/20/2021   Procedure: BYPASS GRAFT FEMORAL-PT ARTERY LEFT USING 6 MM X 80 CM GORE PROPATEN VASCULAR GRAFT REMOVABLE RING;  Surgeon: Waynetta Sandy, MD;  Location: [place];  Service: Vascular;  Laterality: Left;   WOUND DEBRIDEMENT Left 02/09/2021   Procedure: LEFT LEG WASHOUT AND WOUND CLOSURE;  Surgeon: Waynetta Sandy, MD;  Location: [place];  Service: Vascular;  Laterality: Left;    Allergies  Allergen Reactions  Contrast Media [Iodinated Contrast Media] Nausea Only    NOT AN ALLERGY. ONLY NAUSEA. 12/11/20  Newly reported by [facility] on 12/04/20.    Outpatient Encounter Medications as of 07/19/2021  Medication Sig   acetaminophen (TYLENOL) 325 MG tablet Take 2 tablets (650 mg total) by mouth every 6 (six) hours as needed for moderate pain or mild pain.   AMLODIPINE BESYLATE PO Take 5 mg by mouth daily.   apixaban (ELIQUIS) 5 MG TABS tablet Take 1 tablet (5 mg total) by mouth 2 (two) times daily.   aspirin EC 81 MG tablet Take 81 mg by mouth daily. Swallow whole.   atorvastatin (LIPITOR) 40 MG tablet Take 1 tablet (40 mg total) by mouth at bedtime.   calcium carbonate (TUMS EX) 750 MG chewable tablet Chew 1 tablet by mouth daily.   Carboxymethylcellulose Sodium (ARTIFICIAL TEARS OP) Apply to eye. GIVE 2 GTTS IN [facility] EYE   DULoxetine (CYMBALTA) 60 MG capsule Take 1 capsule (60 mg total) by mouth daily.   famotidine (PEPCID) 20 MG tablet Take 1 tablet (20 mg total) by mouth daily.   insulin aspart (NOVOLOG) 100 UNIT/ML injection Inject 5 Units into the skin. For blood glucose  greater than or equal to 300   insulin glargine (LANTUS) 100 UNIT/ML injection Inject 10 Units into the skin at bedtime.   melatonin 3 MG TABS tablet Take 3 mg by mouth at bedtime.   melatonin 5 MG TABS Take 5 mg by mouth at bedtime as needed.   Menthol, Topical Analgesic, (BIOFREEZE) 4 % GEL Apply to right shoulder BID   METFORMIN HCL PO Take 1,000 mg by mouth 2 (two) times daily.   methocarbamol (ROBAXIN) 500 MG tablet Take 500 mg by mouth every 8 (eight) hours as needed for muscle spasms.   Nutritional Supplements (ENSURE PO) Take by mouth. TAKE 237 ML BY MOUTH TWICE DAILY FOR SUPPLEMENT   omeprazole (PRILOSEC) 40 MG capsule Take 40 mg by mouth daily.   ondansetron (ZOFRAN) 4 MG tablet Take 4 mg by mouth every 8 (eight) hours as needed for nausea or vomiting.   oxyCODONE 30 MG 12 hr tablet Take 1 tablet (30 mg total) by mouth every 12 (twelve) hours.   Oxycodone HCl 10 MG TABS Take 1 tablet (10 mg total) by mouth every 6 (six) hours as needed.   polyethylene glycol (MIRALAX / GLYCOLAX) 17 g packet Take 17 g by mouth daily as needed.   pregabalin (LYRICA) 150 MG capsule Take 1 capsule (150 mg total) by mouth 3 (three) times daily.   QUETIAPINE FUMARATE PO Take 200 mg by mouth at bedtime. For depression   senna-docusate (SENOKOT-S) 8.6-50 MG tablet Take 1 tablet by mouth at bedtime.   No facility-administered encounter medications on file as of 07/19/2021.    Review of Systems  Constitutional:  Negative for activity change, appetite change and fever.  HENT:  Negative for sore throat.   Eyes: Negative.   Respiratory:  Negative for chest tightness and shortness of breath.   Cardiovascular:  Negative for chest pain and leg swelling.  Gastrointestinal:  Negative for abdominal distention, diarrhea and vomiting.  Genitourinary:  Negative for dysuria, frequency and urgency.  Skin:  Negative for color change.  Neurological:  Negative for dizziness and headaches.  Psychiatric/Behavioral:   Negative for behavioral problems and sleep disturbance. The patient is not nervous/anxious.       Immunization History  Administered Date(s) Administered   Influenza,inj,Quad PF,6+ Mos 01/28/2021   Pertinent  Health Maintenance Due  Topic Date Due   URINE MICROALBUMIN  Never done   COLONOSCOPY (Pts 45-50yrs Insurance coverage will need to be confirmed)  Never done   INFLUENZA VACCINE  Completed   Fall Risk 04/26/2021 04/27/2021 04/28/2021 04/28/2021 07/02/2021  Falls in the past year? - - - - 1  Was there an injury with Fall? - - - - 1  Fall Risk Category Calculator - - - - 3  Fall Risk Category - - - - High  Patient Fall Risk Level Moderate fall risk Moderate fall risk Moderate fall risk Moderate fall risk High fall risk  Patient at Risk for Falls Due to - - - - History of fall(s);Impaired mobility  Fall risk Follow up - - - - Education provided;Falls prevention discussed     Vitals:   07/19/21 1641  BP: (!) 136/92  Pulse: 64  Resp: 18  Temp: 97.7 F (36.5 C)  SpO2: 98%  Weight: 211 lb 9.6 oz (96 kg)  Height: 5\' 11"  (1.803 m)   Body mass index is 29.51 kg/m.  Physical Exam Constitutional:      General: He is not in acute distress. HENT:     Head: Normocephalic and atraumatic.     Mouth/Throat:     Mouth: Mucous membranes are moist.  Eyes:     Conjunctiva/sclera: Conjunctivae normal.  Cardiovascular:     Rate and Rhythm: Normal rate and regular rhythm.     Pulses: Normal pulses.     Heart sounds: Normal heart sounds.  Pulmonary:     Effort: Pulmonary effort is normal.     Breath sounds: Normal breath sounds.  Abdominal:     General: Bowel sounds are normal.     Palpations: Abdomen is soft.  Musculoskeletal:        General: No swelling.     Cervical back: Normal range of motion.     Comments: Bilateral AKA  Skin:    General: Skin is warm and dry.  Neurological:     General: No focal deficit present.     Mental Status: He is alert and oriented to person,  place, and time.  Psychiatric:        Mood and Affect: Mood normal.        Behavior: Behavior normal.        Thought Content: Thought content normal.        Judgment: Judgment normal.       Labs reviewed: Recent Labs    11/24/20 2125 12/03/20 1202 04/21/21 0119 04/24/21 0050 04/27/21 0042 05/31/21 0000 06/17/21 0000 06/18/21 0000 06/30/21 0000  NA  --    < > 138 136 136   < > 138 139 137  K  --    < > 3.5 3.7 4.0   < > 4.0 4.3 4.1  CL  --    < > 106 106 105   < > 102 103 103  CO2  --    < > 26 23 24    < > 22 22 28*  GLUCOSE  --    < > 147* 127* 100*  --   --   --   --   BUN  --    < > 9 10 10    < > 14 13 11   CREATININE  --    < > 1.08 1.18 1.26*   < > 0.9 0.9 1.0  CALCIUM  --    < > 8.5* 8.4* 8.8*   < >  9.6 9.8 9.2  MG 2.2  --   --   --   --   --   --   --   --   PHOS 4.7*  --   --   --   --   --   --   --   --    < > = values in this interval not displayed.   Recent Labs    02/18/21 2121 02/21/21 1708 04/14/21 2124 06/17/21 0000  AST 20 18 20  34  ALT 25 21 24  38  ALKPHOS 98 97 67 98  BILITOT 0.6 0.6 0.9  --   PROT 6.4* 7.3 6.7  --   ALBUMIN 3.5 3.9 3.7 4.5   Recent Labs    04/22/21 0056 04/24/21 0050 04/27/21 0042 05/31/21 0000 06/09/21 0000 06/17/21 0000 06/22/21 0000  WBC 7.5 6.8 7.7   < > 7.7 7.5 5.7  NEUTROABS  --   --   --   --  5.20 4.90 2.90  HGB 9.6* 9.3* 10.0*   < > 12.2* 11.9* 11.9*  HCT 29.7* 28.8* 32.0*   < > 39* 38* 36*  MCV 82.3 82.5 83.6  --   --   --   --   PLT 330 329 358   < > 280 259 238   < > = values in this interval not displayed.   Lab Results  Component Value Date   TSH 2.337 11/24/2020   Lab Results  Component Value Date   HGBA1C 10.6 06/10/2021   Lab Results  Component Value Date   CHOL 266 (H) 04/15/2021   HDL 31 (L) 04/15/2021   LDLCALC 159 (H) 04/15/2021   TRIG 378 (H) 04/15/2021   CHOLHDL 8.6 04/15/2021    Significant Diagnostic Results in last 30 days:  No results found.  Assessment/Plan  1. Acute  pain of right shoulder -  continue application of Biofreeze 4% gel topically to right shoulder BID -  orthopedic consult  2. Chronic deep vein thrombosis (DVT) of other vein of right lower extremity (HCC) -  continue Eliquis -  Doppler ultrasound of RUE  3. Chronic pain syndrome -   continue Oxycontin ER and Oxycodone IR PRN - referred to Pain Clinic   Family/ staff Communication: Discussed plan of care with resident and charge nurse.  Labs/tests ordered:   Doppler ultrasound of RUE   Durenda Age, DNP, MSN, FNP-BC [place] [phone] (Monday-Friday 8:00 a.m. - 5:00 p.m.) [phone] (after hours)

## 2021-07-19 NOTE — Telephone Encounter (Signed)
Thanks for reaching out to him!

## 2021-07-28 ENCOUNTER — Non-Acute Institutional Stay (SKILLED_NURSING_FACILITY): Payer: Medicare Other | Admitting: Adult Health

## 2021-07-28 ENCOUNTER — Encounter: Payer: Self-pay | Admitting: Adult Health

## 2021-07-28 DIAGNOSIS — E118 Type 2 diabetes mellitus with unspecified complications: Secondary | ICD-10-CM | POA: Diagnosis not present

## 2021-07-28 DIAGNOSIS — I10 Essential (primary) hypertension: Secondary | ICD-10-CM | POA: Diagnosis not present

## 2021-07-28 LAB — BASIC METABOLIC PANEL
BUN: 13 (ref 4–21)
CO2: 25 — AB (ref 13–22)
Chloride: 98 — AB (ref 99–108)
Creatinine: 0.8 (ref 0.6–1.3)
Glucose: 110
Potassium: 4.4 mEq/L (ref 3.5–5.1)
Sodium: 135 — AB (ref 137–147)

## 2021-07-28 LAB — CBC AND DIFFERENTIAL
HCT: 42 (ref 41–53)
Hemoglobin: 12.8 — AB (ref 13.5–17.5)
WBC: 12.8

## 2021-07-28 LAB — COMPREHENSIVE METABOLIC PANEL: Calcium: 10.1 (ref 8.7–10.7)

## 2021-07-28 LAB — CBC: RBC: 5.32 — AB (ref 3.87–5.11)

## 2021-07-28 NOTE — Progress Notes (Signed)
Location:  [place] Room Number: 113-B Place of Service:  SNF (31) Provider:  Durenda Age, DNP, FNP-BC  Patient Care Team: [name], MD as PCP - General (Internal Medicine) Drema Pry as Social Worker [place], Cleophus Molt as Social Worker [name], RN as Case Manager  Extended Emergency Contact Information Primary Emergency Contact: Bralley,BERNARD Address: [address] [place]          Kathyrn Lass Mobile Phone: [phone] Relation: Brother Secondary Emergency Contact: Kaing,Kim Mobile Phone: [phone] Relation: Other Preferred language: English Interpreter needed? No  Code Status:  Full Code   Goals of care: Advanced Directive information Advanced Directives 07/28/2021  Does Patient Have a Medical Advance Directive? No  Would patient like information on creating a medical advance directive? No - Patient declined     Chief Complaint  Patient presents with   Acute Visit    Elevated blood pressure     HPI:  Pt is a 51 y.o. male seen today for an acute visit regarding elevated BPs. BPs 149/72, 143/106, 186/72, 156/85. He denies headache nor dizziness. No medication for hypertension at present.CBGs ranging from 95 to 200. He takes Lantus 10 units SQ at bedtime and Novolog 5 units SQ before meals for CBG > = 300 and Metformin 1,000 mg BID. He has a PMH of hyperlipidemia, hypertension and severe PVD/PAD s/p bilateral AKA.  He is a long-term care resident of [facility].   Past Medical History:  Diagnosis Date   Anxiety    Atypical chest pain 01/13/2021   Clotting disorder ([place])    Depression    H/O blood clots    Hypertension    Left leg weakness 11/24/2020   Past Surgical History:  Procedure Laterality Date   ABDOMINAL AORTOGRAM W/LOWER EXTREMITY N/A 01/18/2021   Procedure: ABDOMINAL AORTOGRAM W/LOWER EXTREMITY;  Surgeon: Waynetta Sandy, MD;  Location: [facility];  Service:  Cardiovascular;  Laterality: N/A;   AMPUTATION Left 04/15/2021   Procedure: LEFT ABOVE KNEE AMPUTATION;  Surgeon: Cherre Robins, MD;  Location: [place];  Service: Vascular;  Laterality: Left;   BELOW KNEE LEG AMPUTATION Right 2018   FEMORAL-TIBIAL BYPASS GRAFT Left 01/20/2021   Procedure: BYPASS GRAFT FEMORAL-PT ARTERY LEFT USING 6 MM X 80 CM GORE PROPATEN VASCULAR GRAFT REMOVABLE RING;  Surgeon: Waynetta Sandy, MD;  Location: [place];  Service: Vascular;  Laterality: Left;   WOUND DEBRIDEMENT Left 02/09/2021   Procedure: LEFT LEG WASHOUT AND WOUND CLOSURE;  Surgeon: Waynetta Sandy, MD;  Location: [place];  Service: Vascular;  Laterality: Left;    Allergies  Allergen Reactions   Contrast Media [Iodinated Contrast Media] Nausea Only    NOT AN ALLERGY. ONLY NAUSEA. 12/11/20  Newly reported by [facility] on 12/04/20.    Outpatient Encounter Medications as of 07/28/2021  Medication Sig   acetaminophen (TYLENOL) 325 MG tablet Take 2 tablets (650 mg total) by mouth every 6 (six) hours as needed for moderate pain or mild pain.   AMLODIPINE BESYLATE PO Take 5 mg by mouth daily.   apixaban (ELIQUIS) 5 MG TABS tablet Take 1 tablet (5 mg total) by mouth 2 (two) times daily.   aspirin EC 81 MG tablet Take 81 mg by mouth daily. Swallow whole.   atorvastatin (LIPITOR) 40 MG tablet Take 1 tablet (40 mg total) by mouth at bedtime.   calcium carbonate (TUMS EX) 750 MG chewable tablet Chew 1 tablet by mouth daily.  Carboxymethylcellulose Sodium (ARTIFICIAL TEARS OP) Apply to eye. GIVE 2 GTTS IN [facility]   DULoxetine (CYMBALTA) 60 MG capsule Take 1 capsule (60 mg total) by mouth daily.   famotidine (PEPCID) 20 MG tablet Take 1 tablet (20 mg total) by mouth daily.   insulin aspart (NOVOLOG) 100 UNIT/ML injection Inject 5 Units into the skin. For blood glucose greater than or equal to 300   insulin glargine (LANTUS) 100 UNIT/ML injection Inject 10 Units into the skin at bedtime.    melatonin 3 MG TABS tablet Take 3 mg by mouth at bedtime.   melatonin 5 MG TABS Take 5 mg by mouth at bedtime as needed.   Menthol, Topical Analgesic, (BIOFREEZE) 4 % GEL Apply 1 application topically 2 (two) times daily. Apply to right shoulder   METFORMIN HCL PO Take 1,000 mg by mouth 2 (two) times daily.   methocarbamol (ROBAXIN) 500 MG tablet Take 500 mg by mouth every 8 (eight) hours as needed for muscle spasms.   omeprazole (PRILOSEC) 40 MG capsule Take 40 mg by mouth daily.   ondansetron (ZOFRAN) 4 MG tablet Take 4 mg by mouth every 8 (eight) hours as needed for nausea or vomiting.   oxyCODONE 30 MG 12 hr tablet Take 1 tablet (30 mg total) by mouth every 12 (twelve) hours.   Oxycodone HCl 10 MG TABS Take 1 tablet (10 mg total) by mouth every 6 (six) hours as needed.   polyethylene glycol (MIRALAX / GLYCOLAX) 17 g packet Take 17 g by mouth daily as needed.   pregabalin (LYRICA) 150 MG capsule Take 1 capsule (150 mg total) by mouth 3 (three) times daily.   QUETIAPINE FUMARATE PO Take 200 mg by mouth at bedtime. For depression   senna-docusate (SENOKOT-S) 8.6-50 MG tablet Take 1 tablet by mouth at bedtime.   [DISCONTINUED] Nutritional Supplements (ENSURE PO) Take by mouth. TAKE 237 ML BY MOUTH TWICE DAILY FOR SUPPLEMENT   No facility-administered encounter medications on file as of 07/28/2021.    Review of Systems  Constitutional:  Negative for activity change, appetite change and fever.  HENT:  Negative for sore throat.   Eyes: Negative.   Cardiovascular:  Negative for chest pain and leg swelling.  Gastrointestinal:  Negative for abdominal distention, diarrhea and vomiting.  Genitourinary:  Negative for dysuria, frequency and urgency.  Skin:  Negative for color change.  Neurological:  Negative for dizziness and headaches.  Psychiatric/Behavioral:  Negative for behavioral problems and sleep disturbance. The patient is not nervous/anxious.       Immunization History  Administered  Date(s) Administered   Influenza,inj,Quad PF,6+ Mos 01/28/2021   Pertinent  Health Maintenance Due  Topic Date Due   URINE MICROALBUMIN  Never done   COLONOSCOPY (Pts 45-53yrs Insurance coverage will need to be confirmed)  Never done   INFLUENZA VACCINE  Completed   Fall Risk 04/26/2021 04/27/2021 04/28/2021 04/28/2021 07/02/2021  Falls in the past year? - - - - 1  Was there an injury with Fall? - - - - 1  Fall Risk Category Calculator - - - - 3  Fall Risk Category - - - - High  Patient Fall Risk Level Moderate fall risk Moderate fall risk Moderate fall risk Moderate fall risk High fall risk  Patient at Risk for Falls Due to - - - - History of fall(s);Impaired mobility  Fall risk Follow up - - - - Education provided;Falls prevention discussed     Vitals:   07/28/21 1036  BP: Marland Kitchen)  149/72  Pulse: 82  Temp: 97.9 F (36.6 C)  Weight: 215 lb (97.5 kg)  Height: 5\' 11"  (1.803 m)   Body mass index is 29.99 kg/m.  Physical Exam Constitutional:      General: He is not in acute distress. HENT:     Head: Normocephalic and atraumatic.     Mouth/Throat:     Mouth: Mucous membranes are moist.  Eyes:     Conjunctiva/sclera: Conjunctivae normal.  Cardiovascular:     Rate and Rhythm: Normal rate and regular rhythm.     Pulses: Normal pulses.     Heart sounds: Normal heart sounds.  Pulmonary:     Effort: Pulmonary effort is normal.     Breath sounds: Normal breath sounds.  Abdominal:     General: Bowel sounds are normal.     Palpations: Abdomen is soft.  Musculoskeletal:        General: No swelling. Normal range of motion.     Cervical back: Normal range of motion.     Comments: Bilateral AKA.  Skin:    General: Skin is warm and dry.  Neurological:     General: No focal deficit present.     Mental Status: He is alert and oriented to person, place, and time.  Psychiatric:        Mood and Affect: Mood normal.        Behavior: Behavior normal.        Thought Content: Thought  content normal.        Judgment: Judgment normal.       Labs reviewed: Recent Labs    11/24/20 2125 12/03/20 1202 04/21/21 0119 04/24/21 0050 04/27/21 0042 05/31/21 0000 06/17/21 0000 06/18/21 0000 06/30/21 0000  NA  --    < > 138 136 136   < > 138 139 137  K  --    < > 3.5 3.7 4.0   < > 4.0 4.3 4.1  CL  --    < > 106 106 105   < > 102 103 103  CO2  --    < > 26 23 24    < > 22 22 28*  GLUCOSE  --    < > 147* 127* 100*  --   --   --   --   BUN  --    < > 9 10 10    < > 14 13 11   CREATININE  --    < > 1.08 1.18 1.26*   < > 0.9 0.9 1.0  CALCIUM  --    < > 8.5* 8.4* 8.8*   < > 9.6 9.8 9.2  MG 2.2  --   --   --   --   --   --   --   --   PHOS 4.7*  --   --   --   --   --   --   --   --    < > = values in this interval not displayed.   Recent Labs    02/18/21 2121 02/21/21 1708 04/14/21 2124 06/17/21 0000  AST 20 18 20  34  ALT 25 21 24  38  ALKPHOS 98 97 67 98  BILITOT 0.6 0.6 0.9  --   PROT 6.4* 7.3 6.7  --   ALBUMIN 3.5 3.9 3.7 4.5   Recent Labs    04/22/21 0056 04/24/21 0050 04/27/21 0042 05/31/21 0000 06/09/21 0000 06/17/21 0000 06/22/21 0000  WBC 7.5 6.8 7.7   < >  7.7 7.5 5.7  NEUTROABS  --   --   --   --  5.20 4.90 2.90  HGB 9.6* 9.3* 10.0*   < > 12.2* 11.9* 11.9*  HCT 29.7* 28.8* 32.0*   < > 39* 38* 36*  MCV 82.3 82.5 83.6  --   --   --   --   PLT 330 329 358   < > 280 259 238   < > = values in this interval not displayed.   Lab Results  Component Value Date   TSH 2.337 11/24/2020   Lab Results  Component Value Date   HGBA1C 10.6 06/10/2021   Lab Results  Component Value Date   CHOL 266 (H) 04/15/2021   HDL 31 (L) 04/15/2021   LDLCALC 159 (H) 04/15/2021   TRIG 378 (H) 04/15/2021   CHOLHDL 8.6 04/15/2021    Significant Diagnostic Results in last 30 days:  No results found.  Assessment/Plan  1. 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 - 5:00 p.m.) [phone] (after hours)

## 2021-08-04 ENCOUNTER — Non-Acute Institutional Stay (SKILLED_NURSING_FACILITY): Payer: Medicare Other | Admitting: Adult Health

## 2021-08-04 ENCOUNTER — Other Ambulatory Visit (HOSPITAL_COMMUNITY): Payer: Self-pay

## 2021-08-04 ENCOUNTER — Encounter: Payer: Self-pay | Admitting: Adult Health

## 2021-08-04 DIAGNOSIS — F339 Major depressive disorder, recurrent, unspecified: Secondary | ICD-10-CM

## 2021-08-04 DIAGNOSIS — E782 Mixed hyperlipidemia: Secondary | ICD-10-CM

## 2021-08-04 DIAGNOSIS — I10 Essential (primary) hypertension: Secondary | ICD-10-CM

## 2021-08-04 DIAGNOSIS — G894 Chronic pain syndrome: Secondary | ICD-10-CM

## 2021-08-04 DIAGNOSIS — M792 Neuralgia and neuritis, unspecified: Secondary | ICD-10-CM | POA: Diagnosis not present

## 2021-08-04 DIAGNOSIS — K219 Gastro-esophageal reflux disease without esophagitis: Secondary | ICD-10-CM

## 2021-08-04 DIAGNOSIS — D62 Acute posthemorrhagic anemia: Secondary | ICD-10-CM | POA: Diagnosis not present

## 2021-08-04 DIAGNOSIS — E118 Type 2 diabetes mellitus with unspecified complications: Secondary | ICD-10-CM | POA: Diagnosis not present

## 2021-08-04 DIAGNOSIS — I82591 Chronic embolism and thrombosis of other specified deep vein of right lower extremity: Secondary | ICD-10-CM

## 2021-08-04 DIAGNOSIS — Z89612 Acquired absence of left leg above knee: Secondary | ICD-10-CM

## 2021-08-04 MED ORDER — INSULIN PEN NEEDLE 32G X 4 MM MISC
1.0000 | Freq: Every day | 0 refills | Status: AC
  Filled 2021-08-04: qty 100, 90d supply, fill #0

## 2021-08-04 MED ORDER — DULOXETINE HCL 60 MG PO CPEP
60.0000 mg | ORAL_CAPSULE | Freq: Every day | ORAL | 0 refills | Status: AC
  Filled 2021-08-04 – 2021-08-16 (×3): qty 30, 30d supply, fill #0

## 2021-08-04 MED ORDER — OXYCODONE HCL 10 MG PO TABS
10.0000 mg | ORAL_TABLET | Freq: Four times a day (QID) | ORAL | 0 refills | Status: AC | PRN
  Filled 2021-08-04 (×3): qty 30, 8d supply, fill #0

## 2021-08-04 MED ORDER — ACCU-CHEK SOFTCLIX LANCETS MISC: Freq: Four times a day (QID) | 0 refills | Status: AC

## 2021-08-04 MED ORDER — OXYCODONE HCL ER 30 MG PO T12A
1.0000 | EXTENDED_RELEASE_TABLET | Freq: Two times a day (BID) | ORAL | 0 refills | Status: AC
  Filled 2021-08-04 (×3): qty 60, 30d supply, fill #0

## 2021-08-04 MED ORDER — ATORVASTATIN CALCIUM 40 MG PO TABS
40.0000 mg | ORAL_TABLET | Freq: Every day | ORAL | 0 refills | Status: AC
  Filled 2021-08-04 (×2): qty 30, 30d supply, fill #0

## 2021-08-04 MED ORDER — OMEPRAZOLE 40 MG PO CPDR
40.0000 mg | DELAYED_RELEASE_CAPSULE | Freq: Every day | ORAL | 0 refills | Status: AC
  Filled 2021-08-04 (×2): qty 30, 30d supply, fill #0

## 2021-08-04 MED ORDER — AMLODIPINE BESYLATE 5 MG PO TABS
5.0000 mg | ORAL_TABLET | Freq: Every day | ORAL | 0 refills | Status: AC
  Filled 2021-08-04 (×2): qty 30, 30d supply, fill #0

## 2021-08-04 MED ORDER — MELATONIN 3 MG PO TABS: 3.0000 mg | ORAL_TABLET | Freq: Every day | ORAL | 0 refills | Status: AC

## 2021-08-04 MED ORDER — ARTIFICIAL TEARS 1 % OP SOLN
2.0000 [drp] | Freq: Two times a day (BID) | OPHTHALMIC | 0 refills | Status: AC
  Filled 2021-08-04: qty 15, fill #0

## 2021-08-04 MED ORDER — SENNOSIDES-DOCUSATE SODIUM 8.6-50 MG PO TABS: 1.0000 | ORAL_TABLET | Freq: Every day | ORAL | Status: AC

## 2021-08-04 MED ORDER — FAMOTIDINE 20 MG PO TABS
20.0000 mg | ORAL_TABLET | Freq: Every day | ORAL | 0 refills | Status: AC
  Filled 2021-08-04 (×2): qty 30, 30d supply, fill #0

## 2021-08-04 MED ORDER — ONDANSETRON HCL 4 MG PO TABS
4.0000 mg | ORAL_TABLET | Freq: Three times a day (TID) | ORAL | 0 refills | Status: AC | PRN
  Filled 2021-08-04 (×2): qty 20, 7d supply, fill #0

## 2021-08-04 MED ORDER — BIOFREEZE 4 % EX GEL: 1.0000 "application " | Freq: Two times a day (BID) | CUTANEOUS | 0 refills | Status: AC

## 2021-08-04 MED ORDER — GLUCOSE BLOOD VI STRP
ORAL_STRIP | Freq: Four times a day (QID) | 0 refills | Status: DC
  Filled 2021-08-04: qty 200, 50d supply, fill #0

## 2021-08-04 MED ORDER — QUETIAPINE FUMARATE 200 MG PO TABS
200.0000 mg | ORAL_TABLET | Freq: Every day | ORAL | 0 refills | Status: AC
  Filled 2021-08-04 – 2021-08-16 (×3): qty 30, 30d supply, fill #0

## 2021-08-04 MED ORDER — GLUCOSE BLOOD VI STRP: ORAL_STRIP | Freq: Four times a day (QID) | 0 refills | Status: AC

## 2021-08-04 MED ORDER — METFORMIN HCL 1000 MG PO TABS
1000.0000 mg | ORAL_TABLET | Freq: Two times a day (BID) | ORAL | 0 refills | Status: AC
  Filled 2021-08-04 (×2): qty 60, 30d supply, fill #0

## 2021-08-04 MED ORDER — APIXABAN 5 MG PO TABS
5.0000 mg | ORAL_TABLET | Freq: Two times a day (BID) | ORAL | 0 refills | Status: AC
  Filled 2021-08-04 – 2021-08-19 (×3): qty 60, 30d supply, fill #0

## 2021-08-04 MED ORDER — METHOCARBAMOL 500 MG PO TABS
500.0000 mg | ORAL_TABLET | Freq: Three times a day (TID) | ORAL | 0 refills | Status: AC | PRN
  Filled 2021-08-04 – 2021-08-16 (×3): qty 30, 10d supply, fill #0

## 2021-08-04 MED ORDER — PREGABALIN 150 MG PO CAPS
150.0000 mg | ORAL_CAPSULE | Freq: Three times a day (TID) | ORAL | 0 refills | Status: AC
  Filled 2021-08-04 – 2021-08-16 (×2): qty 90, 30d supply, fill #0

## 2021-08-04 MED ORDER — INSULIN GLARGINE 100 UNIT/ML ~~LOC~~ SOLN
10.0000 [IU] | Freq: Every day | SUBCUTANEOUS | 0 refills | Status: DC
  Filled 2021-08-04 (×2): qty 10, 100d supply, fill #0

## 2021-08-04 MED ORDER — BLOOD GLUCOSE MONITOR SYSTEM W/DEVICE KIT: PACK | Freq: Four times a day (QID) | 0 refills | Status: AC

## 2021-08-04 MED ORDER — BLOOD GLUCOSE MONITOR SYSTEM W/DEVICE KIT
PACK | Freq: Four times a day (QID) | 0 refills | Status: DC
  Filled 2021-08-04: qty 1, 1d supply, fill #0

## 2021-08-04 MED ORDER — INSULIN GLARGINE 100 UNIT/ML SOLOSTAR PEN
10.0000 [IU] | PEN_INJECTOR | Freq: Every day | SUBCUTANEOUS | 0 refills | Status: AC
  Filled 2021-08-04: qty 3, 28d supply, fill #0

## 2021-08-04 MED ORDER — ACCU-CHEK SOFTCLIX LANCETS MISC: Freq: Four times a day (QID) | 0 refills | Status: DC

## 2021-08-04 NOTE — Progress Notes (Signed)
Location:  [facility] Room Number: 113-B Place of Service:  SNF (31) Provider:  Durenda Age, DNP, FNP-BC  Patient Care Team: [name], MD as PCP - General (Internal Medicine) Drema Pry as Social Worker [place], Cleophus Molt as Social Worker [name], RN as Case Manager  Extended Emergency Contact Information Primary Emergency Contact: Nachtigal,BERNARD Address: [address]          [place] Mobile Phone: [phone] Relation: Brother Secondary Emergency Contact: Kaing,Kim Mobile Phone: [phone] Relation: Other Preferred language: English Interpreter needed? No  Code Status:   Full Code  Goals of care: Advanced Directive information Advanced Directives 08/04/2021  Does Patient Have a Medical Advance Directive? No  Would patient like information on creating a medical advance directive? No - Patient declined     Chief Complaint  Patient presents with   Discharge Note    For discharge home on 08/05/21 with Home health PT and OT    HPI:  Pt is a 51 y.o. male who is for discharge home with Home health PT, OT and Nursing.  He was admitted to [facility] on 04/28/21 post hospital admission 04/14/21 to 04/28/21.  He has a PMH of hypertension, hyperlipidemia and severe PVD/PAD S/P right AKA. He had progressive worsening stabbing chronic pain on his left leg.  He has been taking Lyrica and Percocet at home without relief of pain.  He has a history of previous right AKA 5 years ago and femoral to posterior tibial artery bypass with prosthetic graft in August 2022.  He was a scheduled for a left leg amputation on 12/03 with vascular surgery.  Vascular surgery was consulted and recommended no further imaging and for the patient to undergo an amputation.  He had left AKA with vascular surgery on 11/17.  He was initially placed on PCA pump for postop pain control, and was weaned off.  He was weaned off  Dilaudid after patient was put on OxyContin 30 mg twice a day and oxycodone 15 mg every 6 hours.  At [facility], Oxycodone 15 mg was changed to Oxycodone 10 mg  every 6 hours PRN. He was started on Lantus and Metformin for diabetes mellitus. He was started on Amlodipine for hypertension.  Patient was admitted to this facility for short-term rehabilitation after the patient's recent hospitalization.  Patient has completed SNF rehabilitation and therapy has cleared the patient for discharge.   Past Medical History:  Diagnosis Date   Anxiety    Atypical chest pain 01/13/2021   Clotting disorder ([place])    Depression    H/O blood clots    Hypertension    Left leg weakness 11/24/2020   Past Surgical History:  Procedure Laterality Date   ABDOMINAL AORTOGRAM W/LOWER EXTREMITY N/A 01/18/2021   Procedure: ABDOMINAL AORTOGRAM W/LOWER EXTREMITY;  Surgeon: Waynetta Sandy, MD;  Location: [facility];  Service: Cardiovascular;  Laterality: N/A;   AMPUTATION Left 04/15/2021   Procedure: LEFT ABOVE KNEE AMPUTATION;  Surgeon: Cherre Robins, MD;  Location: [place];  Service: Vascular;  Laterality: Left;   BELOW KNEE LEG AMPUTATION Right 2018   FEMORAL-TIBIAL BYPASS GRAFT Left 01/20/2021   Procedure: BYPASS GRAFT FEMORAL-PT ARTERY LEFT USING 6 MM X 80 CM GORE PROPATEN VASCULAR GRAFT REMOVABLE RING;  Surgeon: Waynetta Sandy, MD;  Location: [place];  Service: Vascular;  Laterality: Left;   WOUND DEBRIDEMENT Left 02/09/2021   Procedure: LEFT LEG WASHOUT AND WOUND CLOSURE;  Surgeon: Waynetta Sandy, MD;  Location: [facility];  Service: Vascular;  Laterality: Left;    Allergies  Allergen Reactions   Contrast Media [Iodinated Contrast Media] Nausea Only    NOT AN ALLERGY. ONLY NAUSEA. 12/11/20  Newly reported by [facility] on 12/04/20.    Outpatient Encounter Medications as of 08/04/2021  Medication Sig   amLODipine (NORVASC) 5 MG tablet Take 1 tablet (5 mg total)  by mouth daily.   blood glucose meter kit and supplies Dispense based on patient and insurance preference. Use up to four times daily as directed. (FOR ICD-10 E10.9, E11.9).   carboxymethylcellulose (ARTIFICIAL TEARS) 1 % ophthalmic solution Apply 2 drops to each eye 2 (two) times daily.   metFORMIN (GLUCOPHAGE) 1000 MG tablet Take 1 tablet (1,000 mg total) by mouth 2 (two) times daily.   QUEtiapine (SEROQUEL) 200 MG tablet Take 1 tablet (200 mg total) by mouth at bedtime. For depression   acetaminophen (TYLENOL) 325 MG tablet Take 2 tablets (650 mg total) by mouth every 6 (six) hours as needed for moderate pain or mild pain.   apixaban (ELIQUIS) 5 MG TABS tablet Take 1 tablet (5 mg total) by mouth 2 (two) times daily.   aspirin EC 81 MG tablet Take 81 mg by mouth daily. Swallow whole.   atorvastatin (LIPITOR) 40 MG tablet Take 1 tablet (40 mg total) by mouth at bedtime.   calcium carbonate (TUMS EX) 750 MG chewable tablet Chew 1 tablet by mouth daily.   DULoxetine (CYMBALTA) 60 MG capsule Take 1 capsule (60 mg total) by mouth daily.   famotidine (PEPCID) 20 MG tablet Take 1 tablet (20 mg total) by mouth daily.   insulin glargine (LANTUS) 100 UNIT/ML injection Inject 0.1 mLs (10 Units total) into the skin at bedtime.   melatonin 3 MG TABS tablet Take 1 tablet (3 mg total) by mouth at bedtime.   melatonin 5 MG TABS Take 5 mg by mouth at bedtime as needed.   Menthol, Topical Analgesic, (BIOFREEZE) 4 % GEL Apply 1 application. topically 2 (two) times daily. Apply to right shoulder   methocarbamol (ROBAXIN) 500 MG tablet Take 1 tablet (500 mg total) by mouth every 8 (eight) hours as needed for muscle spasms.   omeprazole (PRILOSEC) 40 MG capsule Take 1 capsule (40 mg total) by mouth daily.   ondansetron (ZOFRAN) 4 MG tablet Take 1 tablet (4 mg total) by mouth every 8 (eight) hours as needed for nausea or vomiting.   oxyCODONE 30 MG 12 hr tablet Take 1 tablet (30 mg total) by mouth every 12 (twelve)  hours.   Oxycodone HCl 10 MG TABS Take 1 tablet (10 mg total) by mouth every 6 (six) hours as needed.   polyethylene glycol (MIRALAX / GLYCOLAX) 17 g packet Take 17 g by mouth daily as needed.   pregabalin (LYRICA) 150 MG capsule Take 1 capsule (150 mg total) by mouth 3 (three) times daily.   senna-docusate (SENOKOT-S) 8.6-50 MG tablet Take 1 tablet by mouth at bedtime.   [DISCONTINUED] AMLODIPINE BESYLATE PO Take 5 mg by mouth daily.   [DISCONTINUED] apixaban (ELIQUIS) 5 MG TABS tablet Take 1 tablet (5 mg total) by mouth 2 (two) times daily.   [DISCONTINUED] atorvastatin (LIPITOR) 40 MG tablet Take 1 tablet (40 mg total) by mouth at bedtime.   [DISCONTINUED] Carboxymethylcellulose Sodium (ARTIFICIAL TEARS OP) Apply to eye. GIVE 2 GTTS IN [facility]   [DISCONTINUED] DULoxetine (CYMBALTA) 60 MG capsule Take 1 capsule (60 mg total)  by mouth daily.   [DISCONTINUED] famotidine (PEPCID) 20 MG tablet Take 1 tablet (20 mg total) by mouth daily.   [DISCONTINUED] insulin aspart (NOVOLOG) 100 UNIT/ML injection Inject 5 Units into the skin. For blood glucose greater than or equal to 300   [DISCONTINUED] insulin glargine (LANTUS) 100 UNIT/ML injection Inject 10 Units into the skin at bedtime.   [DISCONTINUED] melatonin 3 MG TABS tablet Take 3 mg by mouth at bedtime.   [DISCONTINUED] Menthol, Topical Analgesic, (BIOFREEZE) 4 % GEL Apply 1 application topically 2 (two) times daily. Apply to right shoulder   [DISCONTINUED] METFORMIN HCL PO Take 1,000 mg by mouth 2 (two) times daily.   [DISCONTINUED] methocarbamol (ROBAXIN) 500 MG tablet Take 500 mg by mouth every 8 (eight) hours as needed for muscle spasms.   [DISCONTINUED] omeprazole (PRILOSEC) 40 MG capsule Take 40 mg by mouth daily.   [DISCONTINUED] ondansetron (ZOFRAN) 4 MG tablet Take 4 mg by mouth every 8 (eight) hours as needed for nausea or vomiting.   [DISCONTINUED] oxyCODONE 30 MG 12 hr tablet Take 1 tablet (30 mg total) by mouth every 12 (twelve) hours.    [DISCONTINUED] Oxycodone HCl 10 MG TABS Take 1 tablet (10 mg total) by mouth every 6 (six) hours as needed.   [DISCONTINUED] pregabalin (LYRICA) 150 MG capsule Take 1 capsule (150 mg total) by mouth 3 (three) times daily.   [DISCONTINUED] QUETIAPINE FUMARATE PO Take 200 mg by mouth at bedtime. For depression   [DISCONTINUED] senna-docusate (SENOKOT-S) 8.6-50 MG tablet Take 1 tablet by mouth at bedtime.   No facility-administered encounter medications on file as of 08/04/2021.    Review of Systems  Constitutional:  Negative for activity change, appetite change and fever.  HENT:  Negative for sore throat.   Eyes: Negative.   Cardiovascular:  Negative for chest pain and leg swelling.  Gastrointestinal:  Negative for abdominal distention, diarrhea and vomiting.  Genitourinary:  Negative for dysuria, frequency and urgency.  Skin:  Negative for color change.  Neurological:  Negative for dizziness and headaches.  Psychiatric/Behavioral:  Negative for behavioral problems and sleep disturbance. The patient is not nervous/anxious.       Immunization History  Administered Date(s) Administered   Influenza,inj,Quad PF,6+ Mos 01/28/2021   Pertinent  Health Maintenance Due  Topic Date Due   URINE MICROALBUMIN  Never done   COLONOSCOPY (Pts 45-34yr Insurance coverage will need to be confirmed)  Never done   INFLUENZA VACCINE  Completed   Fall Risk 04/26/2021 04/27/2021 04/28/2021 04/28/2021 07/02/2021  Falls in the past year? - - - - 1  Was there an injury with Fall? - - - - 1  Fall Risk Category Calculator - - - - 3  Fall Risk Category - - - - High  Patient Fall Risk Level Moderate fall risk Moderate fall risk Moderate fall risk Moderate fall risk High fall risk  Patient at Risk for Falls Due to - - - - History of fall(s);Impaired mobility  Fall risk Follow up - - - - Education provided;Falls prevention discussed     Vitals:   08/04/21 1330  BP: (!) 146/93  Pulse: 88  Resp: 20  Temp:  (!) 97.2 F (36.2 C)  Weight: 204 lb 12.8 oz (92.9 kg)  Height: _0  (1.803 m)   Body mass index is 28.56 kg/m.  Physical Exam Constitutional:      General: He is not in acute distress. HENT:     Head: Normocephalic and atraumatic.  Mouth/Throat:     Mouth: Mucous membranes are moist.  Eyes:     Conjunctiva/sclera: Conjunctivae normal.  Cardiovascular:     Rate and Rhythm: Normal rate and regular rhythm.     Pulses: Normal pulses.     Heart sounds: Normal heart sounds.  Pulmonary:     Effort: Pulmonary effort is normal.     Breath sounds: Normal breath sounds.  Abdominal:     General: Bowel sounds are normal.     Palpations: Abdomen is soft.  Musculoskeletal:        General: No swelling.     Cervical back: Normal range of motion.     Comments: Bilateral AKA  Skin:    General: Skin is warm and dry.  Neurological:     General: No focal deficit present.     Mental Status: He is alert and oriented to person, place, and time.  Psychiatric:        Mood and Affect: Mood normal.        Behavior: Behavior normal.        Thought Content: Thought content normal.        Judgment: Judgment normal.       Labs reviewed: Recent Labs    11/24/20 2125 12/03/20 1202 04/21/21 0119 04/24/21 0050 04/27/21 0042 05/31/21 0000 06/18/21 0000 06/30/21 0000 07/28/21 0000  NA  --    < > 138 136 136   < > 139 137 135*  K  --    < > 3.5 3.7 4.0   < > 4.3 4.1 4.4  CL  --    < > 106 106 105   < > 103 103 98*  CO2  --    < > _0 < > 22 28* 25*  GLUCOSE  --    < > 147* 127* 100*  --   --   --   --   BUN  --    < > _1 < > _2 CREATININE  --    < > 1.08 1.18 1.26*   < > 0.9 1.0 0.8  CALCIUM  --    < > 8.5* 8.4* 8.8*   < > 9.8 9.2 10.1  MG 2.2  --   --   --   --   --   --   --   --   PHOS 4.7*  --   --   --   --   --   --   --   --    < > = values in this interval not displayed.   Recent Labs    02/18/21 2121 02/21/21 1708 04/14/21 2124 06/17/21 0000  AST  _3 34  ALT _4 38  ALKPHOS 98 97 67 98  BILITOT 0.6 0.6 0.9  --   PROT 6.4* 7.3 6.7  --   ALBUMIN 3.5 3.9 3.7 4.5   Recent Labs    04/22/21 0056 04/24/21 0050 04/27/21 0042 05/31/21 0000 06/09/21 0000 06/17/21 0000 06/22/21 0000 07/28/21 0000  WBC 7.5 6.8 7.7   < > 7.7 7.5 5.7 12.8  NEUTROABS  --   --   --   --  5.20 4.90 2.90  --   HGB 9.6* 9.3* 10.0*   < > 12.2* 11.9* 11.9* 12.8*  HCT 29.7* 28.8* 32.0*   < > 39* 38* 36* 42  MCV 82.3 82.5 83.6  --   --   --   --   --  PLT 330 329 358   < > 280 259 238  --    < > = values in this interval not displayed.   Lab Results  Component Value Date   TSH 2.337 11/24/2020   Lab Results  Component Value Date   HGBA1C 10.6 06/10/2021   Lab Results  Component Value Date   CHOL 266 (H) 04/15/2021   HDL 31 (L) 04/15/2021   LDLCALC 159 (H) 04/15/2021   TRIG 378 (H) 04/15/2021   CHOLHDL 8.6 04/15/2021    Significant Diagnostic Results in last 30 days:  No results found.  Assessment/Plan  1. S/P AKA (above knee amputation), left ([place]) -  follows up with [facility] for bilateral prosthesis -  for Home health PT and OT for therapeutic strengthening exercises  2. Neuropathic pain -   - pregabalin (LYRICA) 150 MG capsule; Take 1 capsule (150 mg total) by mouth 3 (three) times daily.  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  Dispense: 3 mL; Refill: 0 - Insulin Pen Needle 32G X 4 MM MISC; Use at bedtime as directed  Dispense: 100 each; Refill: 0 - glucose blood test strip; Use up to 4 (four) times daily as directed  Dispense: 200 each; Refill: 0 - Blood Glucose Monitoring Suppl (BLOOD GLUCOSE MONITOR SYSTEM) w/Device KIT; Use up to 4 (four) times daily.   Dispense: 1 kit; Refill: 0 - Accu-Chek Softclix Lancets lancets; Use up to 4 (four) times daily as directed  Dispense: 200 each; Refill: 0  4. Acute blood loss anemia Lab Results  Component Value Date   HGB 12.8 (A) 07/28/2021   -  stable  5. Major depression, recurrent, chronic (HCC) - DULoxetine (CYMBALTA) 60 MG capsule; Take 1 capsule (60 mg total) by mouth daily.  Dispense: 30 capsule; Refill: 0 - QUEtiapine (SEROQUEL) 200 MG tablet; Take 1 tablet (200 mg total) by mouth at bedtime. For depression  Dispense: 30 tablet; Refill: 0  6. Chronic pain syndrome - methocarbamol (ROBAXIN) 500 MG tablet; Take 1 tablet (500 mg total) by mouth every 8 (eight) hours as needed for muscle spasms.  Dispense: 30 tablet; Refill: 0 - Oxycodone HCl 10 MG TABS; Take 1 tablet (10 mg total) by mouth every 6 (six) hours as needed.  Dispense: 30 tablet; Refill: 0 - oxyCODONE 30 MG 12 hr tablet; Take 1 tablet (30 mg total) by mouth every 12 (twelve) hours.  Dispense: 60 tablet; Refill: 0 - Menthol, Topical Analgesic, (BIOFREEZE) 4 % GEL; Apply 1 application. topically 2 (two) times daily. Apply to right shoulder  Dispense: 118 mL; Refill: 0   7. Chronic deep vein thrombosis (DVT) of other vein of right lower extremity (HCC) - apixaban (ELIQUIS) 5 MG TABS tablet; Take 1 tablet (5 mg total) by mouth 2 (two) times daily.  Dispense: 60 tablet; Refill: 0   8. Gastroesophageal reflux disease without esophagitis - omeprazole (PRILOSEC) 40 MG capsule; Take 1 capsule (40 mg total) by mouth daily.  Dispense: 30 capsule; Refill: 0 - famotidine (PEPCID) 20 MG tablet; Take 1 tablet (20 mg total) by mouth daily.  Dispense: 30 tablet; Refill: 0  9. Mixed hyperlipidemia Lab Results  Component Value Date   CHOL 266 (H) 04/15/2021   HDL  31 (L) 04/15/2021   LDLCALC 159 (H) 04/15/2021   TRIG 378 (H) 04/15/2021   CHOLHDL 8.6 04/15/2021    - atorvastatin (LIPITOR) 40 MG tablet; Take 1 tablet (40 mg total) by mouth at  bedtime.  Dispense: 30 tablet; Refill: 0  10. Essential hypertension - amLODipine (NORVASC) 5 MG tablet; Take 1 tablet (5 mg total) by mouth daily.  Dispense: 30 tablet; Refill: 0      I have filled out patient's discharge paperwork and e-prescribed medications.  Patient will have home health PT, OT and Nursing.  DME provided: 3-in-1  Total discharge time: Greater than 30 minutes Greater than 50% was spent in counseling and coordination of care.   Discharge time involved coordination of the discharge process with social worker, nursing staff and therapy department. Medical justification for home health services/DME verified.     Durenda Age, DNP, MSN, FNP-BC [facility] [phone] (Monday-Friday 8:00 a.m. - 5:00 p.m.) [phone] (after hours)

## 2021-08-05 ENCOUNTER — Other Ambulatory Visit (HOSPITAL_COMMUNITY): Payer: Self-pay

## 2021-08-09 ENCOUNTER — Telehealth: Payer: Self-pay

## 2021-08-09 NOTE — Telephone Encounter (Signed)
Amber with [facility] requesting VO for Nursing, please call pt back.  ?

## 2021-08-09 NOTE — Telephone Encounter (Signed)
Call from [place], RN [facility] need VO to do [facility] visits 1 time per week for 9 weeks to do Diabetic Teaching for Patient.  Message to be sent to patient's PCP for confirmation.   ?

## 2021-08-16 ENCOUNTER — Other Ambulatory Visit: Payer: Self-pay | Admitting: Internal Medicine

## 2021-08-16 ENCOUNTER — Other Ambulatory Visit (HOSPITAL_COMMUNITY): Payer: Self-pay

## 2021-08-16 DIAGNOSIS — I739 Peripheral vascular disease, unspecified: Secondary | ICD-10-CM

## 2021-08-17 ENCOUNTER — Other Ambulatory Visit (HOSPITAL_COMMUNITY): Payer: Self-pay

## 2021-08-17 ENCOUNTER — Telehealth: Payer: Self-pay

## 2021-08-17 NOTE — Telephone Encounter (Signed)
Santiago Glad with [facility] requesting to speak with a nurse about getting refilled on test strips and lancets. Please call back.  ?

## 2021-08-17 NOTE — Telephone Encounter (Signed)
Called pt who stated he's having transportation issues. Stated he was told by someone he does not have Medicaid. I had [place] to check; he has regular medicaid. And [facility] should be able to provide transportation. Hme looked up in Media - application was filled out in 12/2020; advised pt to call his Education officer, museum. He has an appt tomorrow; stated his brother might bring him if not he will call to re-schedule. ?I called pt again to inform him of the application from last year and to call his Medicaid SW - stated he will. Also informed pt [facility] requires a 3 day call in advance. ?I will ask Wyatt Portela if she can help pt - if so, I will ask pt's doctor to place a SW Referral order. ?

## 2021-08-18 ENCOUNTER — Encounter: Payer: Medicare Other | Admitting: Student

## 2021-08-19 ENCOUNTER — Telehealth: Payer: Self-pay | Admitting: *Deleted

## 2021-08-19 ENCOUNTER — Encounter: Payer: Self-pay | Admitting: Internal Medicine

## 2021-08-19 ENCOUNTER — Other Ambulatory Visit (HOSPITAL_COMMUNITY): Payer: Self-pay

## 2021-08-19 NOTE — Telephone Encounter (Signed)
Call from [facility] need orders for PT Nursing and OT for Patient.  Nurse saw where PT has discontinued services.  Not sure why.  Also informed that patient has not kept a follow up appointment in the Clinics.  Nurse to reach out to patient to have him make and keep an appointment.  Stated has sent over orders for patient and has not received them back to continue services.   ?

## 2021-08-25 ENCOUNTER — Encounter: Payer: Medicare Other | Admitting: Student

## 2021-08-31 ENCOUNTER — Encounter: Payer: Medicare Other | Admitting: Internal Medicine

## 2021-09-01 ENCOUNTER — Encounter: Payer: Self-pay | Admitting: Internal Medicine

## 2021-09-01 ENCOUNTER — Telehealth: Payer: Self-pay | Admitting: *Deleted

## 2021-09-01 NOTE — Telephone Encounter (Signed)
Call from [facility] need verbal orders for PT 1 time per week for 2 weeks.  OT once weekly for 4 weeks.  Nursing Care 1 time a week for 8 weeks .  Need a SW evaluation x 1.  Request to be sent to patient's PCP.   ?

## 2021-09-27 DEATH — deceased

## 2022-04-27 IMAGING — CT CT ANGIO AOBIFEM WO/W CM
1 of 7 series · 5 of 16 positions shown, 7 images · IV contrast (omnipaque)
Comparison: None.

CLINICAL DATA: History of occluded right iliac system and previous
right above the knee amputation with leg pain and back pain, initial
encounter

EXAM:
CT ANGIOGRAPHY OF ABDOMINAL AORTA WITH ILIOFEMORAL RUNOFF
TECHNIQUE: Multidetector CT imaging of the abdomen, pelvis and lower
extremities was performed using the standard protocol during bolus
administration of intravenous contrast. Multiplanar CT image
reconstructions and MIPs were obtained to evaluate the vascular
anatomy.
CONTRAST:  100mL OMNIPAQUE IOHEXOL 350 MG/ML SOLN

[Series 5: arterial · axial · arterial · 0.98mm/px · z∈[-1039,-69]mm · 5 of 729 slices shown, 7 images]
[im 122/729  soft-tissue]
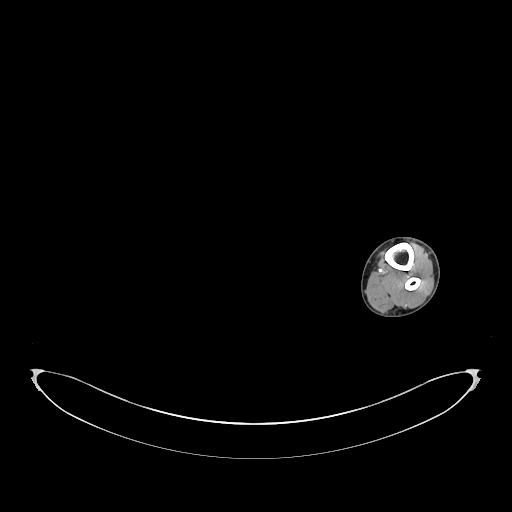
[im 122/729  bone]
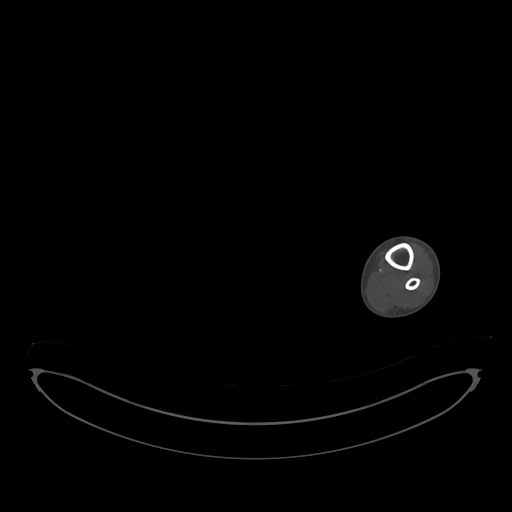
[im 243/729  soft-tissue]
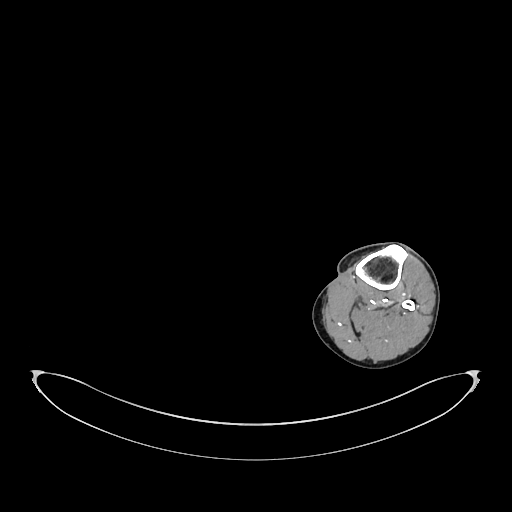
[im 365/729  soft-tissue]
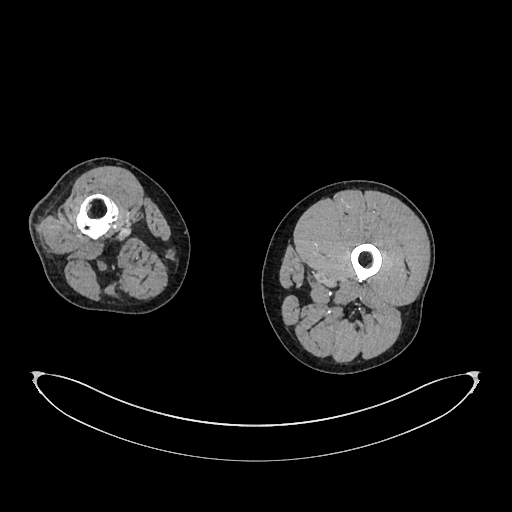
[im 486/729  soft-tissue]
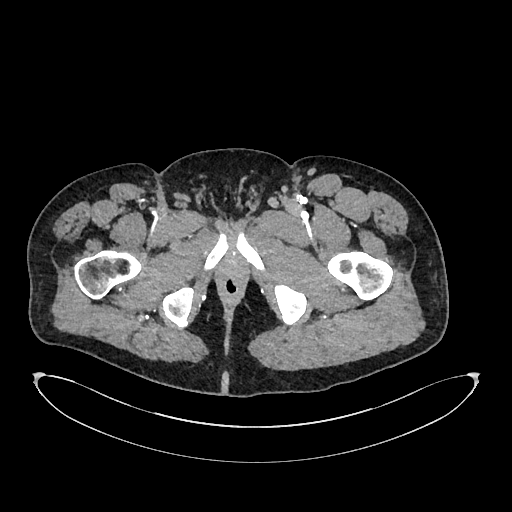
[im 607/729  soft-tissue]
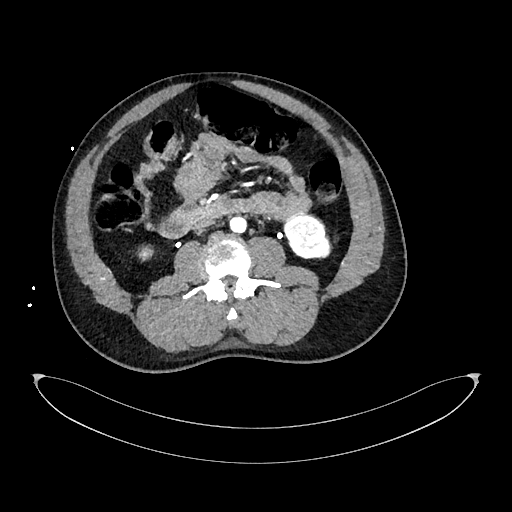
[im 607/729  bone]
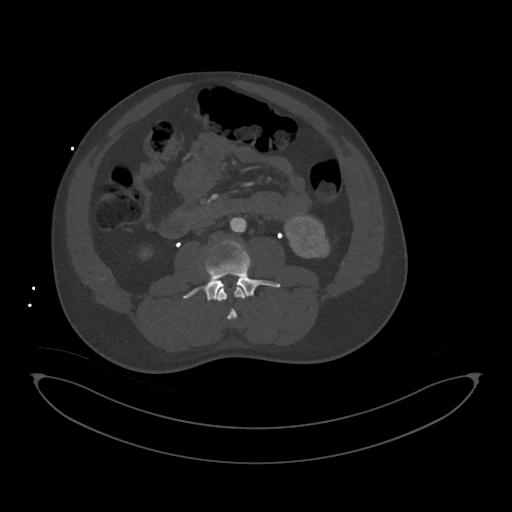

[5 of 16 positions shown; findings below may reference images not displayed]

FINDINGS: VASCULAR

Aorta: No significant atherosclerotic calcifications are noted. Mild
eccentric atheromatous plaque is noted without focal stenosis.

Celiac: Patent without evidence of aneurysm, dissection, vasculitis
or significant stenosis.

SMA: Patent without evidence of aneurysm, dissection, vasculitis or
significant stenosis.

Renals: Both renal arteries are patent without evidence of aneurysm,
dissection, vasculitis, fibromuscular dysplasia or significant
stenosis.

IMA: Patent without evidence of aneurysm, dissection, vasculitis or
significant stenosis.

RIGHT Lower Extremity

Inflow: Long segment occlusion of the common iliac, internal iliac
and external iliac arteries on the right are seen.

Runoff: Common femoral and superficial femoral artery are occluded.
Profundus femoral branches are noted via collateralization. The
overall appearance is similar to that seen on the prior exam.

LEFT Lower Extremity

Inflow: Extensive arterial stents are identified throughout the
common iliac artery on the left extending to the level of the iliac
bifurcation. The external and internal iliac arteries are widely
patent without focal hemodynamically significant stenosis.
Atherosclerotic calcifications are noted in the common femoral
artery.

Runoff: Superficial femoral artery is significantly small with
occlusion in the mid thighs similar to that seen on the prior exam.
Profundus femoral branches are noted. The superficial femoral artery
occlusion extends into the mid popliteal artery where there is
reconstitution just above the knee joint again similar to that seen
on the prior exam. The popliteal artery however occludes just below
the knee joint with some collateral flow into the musculature
reconstitution of the posterior tibial artery is identified which
continues into the foot similar to that seen on the prior exam.
Diminutive anterior tibial and peroneal arteries are identified with
segmental areas of reconstitution. No significant visualization of
the distal anterior tibial artery is noted.

Veins: No specific venous abnormality is noted.

Review of the MIP images confirms the above findings.

NON-VASCULAR

Nonvascular structures of lower extremities demonstrate evidence of
right above the knee amputation. The musculature in left lower
extremity appears within normal limits. No significant soft tissue
abnormality or bony abnormality is noted. The remainder of the non
vascular structures will be covered on the concurrent CTA of the
chest abdomen and pelvis.
IMPRESSION: VASCULAR

Stable long segment occlusion involving the right iliac arterial
system and extending into the common femoral and superficial femoral
artery.

Patent left common iliac artery stent.

Long segment left superficial femoral artery occlusion and proximal
popliteal occlusion.

The more distal popliteal artery is occluded following
reconstitution with multiple muscular collaterals reconstituting
single-vessel runoff via the posterior tibial artery to the ankle.

The overall appearance of these findings is stable from the prior
exam.

NON-VASCULAR

Nonvascular structures of the lower extremities are within normal
limits with the exception of changes of prior above knee amputation
on the right.
